# Patient Record
Sex: Female | Born: 1935
Health system: Southern US, Community
[De-identification: ages and names within clinical notes are randomized; demographics above are authoritative.]

## PROBLEM LIST (undated history)

## (undated) DIAGNOSIS — I10 Essential (primary) hypertension: Secondary | ICD-10-CM

## (undated) DIAGNOSIS — N3010 Interstitial cystitis (chronic) without hematuria: Secondary | ICD-10-CM

## (undated) DIAGNOSIS — K219 Gastro-esophageal reflux disease without esophagitis: Secondary | ICD-10-CM

## (undated) DIAGNOSIS — F32A Depression, unspecified: Secondary | ICD-10-CM

## (undated) DIAGNOSIS — K5790 Diverticulosis of intestine, part unspecified, without perforation or abscess without bleeding: Secondary | ICD-10-CM

## (undated) DIAGNOSIS — T7840XA Allergy, unspecified, initial encounter: Secondary | ICD-10-CM

## (undated) DIAGNOSIS — M549 Dorsalgia, unspecified: Secondary | ICD-10-CM

## (undated) DIAGNOSIS — E70319 Ocular albinism, unspecified: Secondary | ICD-10-CM

## (undated) DIAGNOSIS — C569 Malignant neoplasm of unspecified ovary: Secondary | ICD-10-CM

## (undated) DIAGNOSIS — Z5189 Encounter for other specified aftercare: Secondary | ICD-10-CM

## (undated) DIAGNOSIS — M1990 Unspecified osteoarthritis, unspecified site: Secondary | ICD-10-CM

## (undated) DIAGNOSIS — N390 Urinary tract infection, site not specified: Secondary | ICD-10-CM

## (undated) DIAGNOSIS — R42 Dizziness and giddiness: Secondary | ICD-10-CM

## (undated) DIAGNOSIS — F419 Anxiety disorder, unspecified: Secondary | ICD-10-CM

## (undated) DIAGNOSIS — K449 Diaphragmatic hernia without obstruction or gangrene: Secondary | ICD-10-CM

## (undated) DIAGNOSIS — K222 Esophageal obstruction: Secondary | ICD-10-CM

## (undated) DIAGNOSIS — M21619 Bunion of unspecified foot: Secondary | ICD-10-CM

## (undated) DIAGNOSIS — F329 Major depressive disorder, single episode, unspecified: Secondary | ICD-10-CM

## (undated) DIAGNOSIS — D649 Anemia, unspecified: Secondary | ICD-10-CM

## (undated) DIAGNOSIS — Z86010 Personal history of colonic polyps: Secondary | ICD-10-CM

## (undated) DIAGNOSIS — K589 Irritable bowel syndrome without diarrhea: Secondary | ICD-10-CM

## (undated) DIAGNOSIS — Z9221 Personal history of antineoplastic chemotherapy: Secondary | ICD-10-CM

## (undated) DIAGNOSIS — R918 Other nonspecific abnormal finding of lung field: Secondary | ICD-10-CM

## (undated) HISTORY — DX: Esophageal obstruction: K22.2

## (undated) HISTORY — DX: Irritable bowel syndrome, unspecified: K58.9

## (undated) HISTORY — PX: HEMORRHOID SURGERY: SHX153

## (undated) HISTORY — PX: BREAST CYST EXCISION: SHX579

## (undated) HISTORY — DX: Allergy, unspecified, initial encounter: T78.40XA

## (undated) HISTORY — PX: COLONOSCOPY: SHX174

## (undated) HISTORY — DX: Dizziness and giddiness: R42

## (undated) HISTORY — DX: Ocular albinism, unspecified: E70.319

## (undated) HISTORY — DX: Anxiety disorder, unspecified: F41.9

## (undated) HISTORY — DX: Anemia, unspecified: D64.9

## (undated) HISTORY — DX: Essential (primary) hypertension: I10

## (undated) HISTORY — DX: Interstitial cystitis (chronic) without hematuria: N30.10

## (undated) HISTORY — PX: OTHER SURGICAL HISTORY: SHX169

## (undated) HISTORY — DX: Gastro-esophageal reflux disease without esophagitis: K21.9

## (undated) HISTORY — DX: Unspecified osteoarthritis, unspecified site: M19.90

## (undated) HISTORY — DX: Urinary tract infection, site not specified: N39.0

## (undated) HISTORY — DX: Encounter for other specified aftercare: Z51.89

## (undated) HISTORY — DX: Malignant neoplasm of unspecified ovary: C56.9

## (undated) HISTORY — DX: Depression, unspecified: F32.A

## (undated) HISTORY — DX: Dorsalgia, unspecified: M54.9

## (undated) HISTORY — PX: INCISIONAL HERNIA REPAIR: SHX193

## (undated) HISTORY — DX: Major depressive disorder, single episode, unspecified: F32.9

## (undated) HISTORY — PX: APPENDECTOMY: SHX54

## (undated) HISTORY — DX: Other nonspecific abnormal finding of lung field: R91.8

## (undated) HISTORY — DX: Diaphragmatic hernia without obstruction or gangrene: K44.9

## (undated) HISTORY — DX: Bunion of unspecified foot: M21.619

## (undated) HISTORY — DX: Personal history of colonic polyps: Z86.010

## (undated) HISTORY — DX: Diverticulosis of intestine, part unspecified, without perforation or abscess without bleeding: K57.90

## (undated) SURGERY — VIDEO BRONCHOSCOPY WITH FLUORO
Anesthesia: Moderate Sedation

---

## 1960-04-01 HISTORY — PX: ABDOMINAL HYSTERECTOMY: SHX81

## 1984-04-21 ENCOUNTER — Encounter: Payer: Self-pay | Admitting: Internal Medicine

## 1992-04-01 HISTORY — PX: CHOLECYSTECTOMY: SHX55

## 1993-02-20 ENCOUNTER — Encounter: Payer: Self-pay | Admitting: Family Medicine

## 1993-02-21 ENCOUNTER — Encounter (INDEPENDENT_AMBULATORY_CARE_PROVIDER_SITE_OTHER): Payer: Self-pay | Admitting: Gastroenterology

## 1993-03-09 ENCOUNTER — Encounter: Payer: Self-pay | Admitting: Internal Medicine

## 1993-04-01 HISTORY — PX: INCISIONAL HERNIA REPAIR: SHX193

## 1999-07-24 ENCOUNTER — Encounter: Payer: Self-pay | Admitting: Family Medicine

## 1999-07-24 ENCOUNTER — Encounter: Admission: RE | Admit: 1999-07-24 | Discharge: 1999-07-24 | Payer: Self-pay | Admitting: Family Medicine

## 2004-04-04 ENCOUNTER — Ambulatory Visit: Payer: Self-pay | Admitting: Family Medicine

## 2004-05-01 ENCOUNTER — Ambulatory Visit: Payer: Self-pay | Admitting: Family Medicine

## 2004-05-01 LAB — HM MAMMOGRAPHY: HM Mammogram: NORMAL

## 2004-05-04 ENCOUNTER — Ambulatory Visit: Payer: Self-pay | Admitting: Family Medicine

## 2004-05-18 ENCOUNTER — Other Ambulatory Visit: Admission: RE | Admit: 2004-05-18 | Discharge: 2004-05-18 | Payer: Self-pay | Admitting: Family Medicine

## 2004-05-18 ENCOUNTER — Ambulatory Visit: Payer: Self-pay | Admitting: Family Medicine

## 2004-06-19 ENCOUNTER — Ambulatory Visit: Payer: Self-pay | Admitting: Family Medicine

## 2004-06-19 LAB — FECAL OCCULT BLOOD, GUAIAC: Fecal Occult Blood: NEGATIVE

## 2004-06-20 ENCOUNTER — Ambulatory Visit (HOSPITAL_BASED_OUTPATIENT_CLINIC_OR_DEPARTMENT_OTHER): Admission: RE | Admit: 2004-06-20 | Discharge: 2004-06-20 | Payer: Self-pay | Admitting: Surgery

## 2004-06-20 ENCOUNTER — Ambulatory Visit (HOSPITAL_COMMUNITY): Admission: RE | Admit: 2004-06-20 | Discharge: 2004-06-20 | Payer: Self-pay | Admitting: Surgery

## 2004-06-20 ENCOUNTER — Encounter (INDEPENDENT_AMBULATORY_CARE_PROVIDER_SITE_OTHER): Payer: Self-pay | Admitting: *Deleted

## 2004-06-27 ENCOUNTER — Ambulatory Visit: Payer: Self-pay | Admitting: Family Medicine

## 2005-06-30 HISTORY — PX: OTHER SURGICAL HISTORY: SHX169

## 2007-04-02 DIAGNOSIS — K222 Esophageal obstruction: Secondary | ICD-10-CM

## 2007-04-02 HISTORY — DX: Esophageal obstruction: K22.2

## 2007-07-31 ENCOUNTER — Encounter: Payer: Self-pay | Admitting: Family Medicine

## 2007-07-31 DIAGNOSIS — G8929 Other chronic pain: Secondary | ICD-10-CM | POA: Insufficient documentation

## 2007-07-31 DIAGNOSIS — F325 Major depressive disorder, single episode, in full remission: Secondary | ICD-10-CM | POA: Insufficient documentation

## 2007-07-31 DIAGNOSIS — F329 Major depressive disorder, single episode, unspecified: Secondary | ICD-10-CM

## 2007-07-31 DIAGNOSIS — I10 Essential (primary) hypertension: Secondary | ICD-10-CM | POA: Insufficient documentation

## 2007-07-31 DIAGNOSIS — F411 Generalized anxiety disorder: Secondary | ICD-10-CM

## 2007-07-31 DIAGNOSIS — Z86010 Personal history of colonic polyps: Secondary | ICD-10-CM

## 2007-07-31 DIAGNOSIS — M549 Dorsalgia, unspecified: Secondary | ICD-10-CM

## 2007-07-31 DIAGNOSIS — F419 Anxiety disorder, unspecified: Secondary | ICD-10-CM

## 2007-08-03 ENCOUNTER — Ambulatory Visit: Payer: Self-pay | Admitting: Family Medicine

## 2007-08-18 ENCOUNTER — Ambulatory Visit: Payer: Self-pay | Admitting: Family Medicine

## 2007-08-21 LAB — CONVERTED CEMR LAB
ALT: 16 units/L (ref 0–35)
AST: 22 units/L (ref 0–37)
Alkaline Phosphatase: 59 units/L (ref 39–117)
Basophils Absolute: 0.1 10*3/uL (ref 0.0–0.1)
Basophils Relative: 1.2 % — ABNORMAL HIGH (ref 0.0–1.0)
CO2: 29 meq/L (ref 19–32)
Calcium: 9.6 mg/dL (ref 8.4–10.5)
Cholesterol: 191 mg/dL (ref 0–200)
Creatinine, Ser: 0.9 mg/dL (ref 0.4–1.2)
Glucose, Bld: 96 mg/dL (ref 70–99)
LDL Cholesterol: 114 mg/dL — ABNORMAL HIGH (ref 0–99)
Lymphocytes Relative: 22.3 % (ref 12.0–46.0)
MCHC: 33 g/dL (ref 30.0–36.0)
Monocytes Relative: 8.3 % (ref 3.0–12.0)
Neutrophils Relative %: 62.7 % (ref 43.0–77.0)
RBC: 3.54 M/uL — ABNORMAL LOW (ref 3.87–5.11)
RDW: 12.4 % (ref 11.5–14.6)
Total Bilirubin: 0.8 mg/dL (ref 0.3–1.2)
Total CHOL/HDL Ratio: 3
VLDL: 14 mg/dL (ref 0–40)

## 2007-09-07 ENCOUNTER — Ambulatory Visit: Payer: Self-pay | Admitting: Family Medicine

## 2007-09-28 ENCOUNTER — Telehealth: Payer: Self-pay | Admitting: Internal Medicine

## 2007-09-30 HISTORY — PX: OTHER SURGICAL HISTORY: SHX169

## 2007-10-01 DIAGNOSIS — K589 Irritable bowel syndrome without diarrhea: Secondary | ICD-10-CM | POA: Insufficient documentation

## 2007-10-05 ENCOUNTER — Ambulatory Visit: Payer: Self-pay | Admitting: Internal Medicine

## 2007-10-13 ENCOUNTER — Ambulatory Visit: Payer: Self-pay | Admitting: Internal Medicine

## 2007-10-13 DIAGNOSIS — K222 Esophageal obstruction: Secondary | ICD-10-CM

## 2007-10-15 ENCOUNTER — Ambulatory Visit (HOSPITAL_COMMUNITY): Admission: RE | Admit: 2007-10-15 | Discharge: 2007-10-15 | Payer: Self-pay | Admitting: Internal Medicine

## 2007-10-19 ENCOUNTER — Telehealth: Payer: Self-pay | Admitting: Family Medicine

## 2007-10-19 ENCOUNTER — Telehealth (INDEPENDENT_AMBULATORY_CARE_PROVIDER_SITE_OTHER): Payer: Self-pay | Admitting: *Deleted

## 2007-10-19 DIAGNOSIS — M21619 Bunion of unspecified foot: Secondary | ICD-10-CM

## 2007-10-19 HISTORY — DX: Bunion of unspecified foot: M21.619

## 2007-10-20 ENCOUNTER — Encounter: Payer: Self-pay | Admitting: Internal Medicine

## 2007-10-23 ENCOUNTER — Ambulatory Visit (HOSPITAL_COMMUNITY): Admission: RE | Admit: 2007-10-23 | Discharge: 2007-10-23 | Payer: Self-pay | Admitting: Internal Medicine

## 2007-10-23 ENCOUNTER — Encounter: Payer: Self-pay | Admitting: Internal Medicine

## 2007-10-27 ENCOUNTER — Ambulatory Visit: Payer: Self-pay | Admitting: Internal Medicine

## 2007-11-18 ENCOUNTER — Ambulatory Visit: Payer: Self-pay | Admitting: Family Medicine

## 2007-12-10 ENCOUNTER — Encounter: Payer: Self-pay | Admitting: Family Medicine

## 2007-12-28 ENCOUNTER — Telehealth: Payer: Self-pay | Admitting: Family Medicine

## 2007-12-30 ENCOUNTER — Ambulatory Visit: Payer: Self-pay | Admitting: Family Medicine

## 2007-12-31 HISTORY — PX: FOOT SURGERY: SHX648

## 2008-01-06 ENCOUNTER — Encounter: Payer: Self-pay | Admitting: Family Medicine

## 2008-01-13 ENCOUNTER — Encounter: Payer: Self-pay | Admitting: Family Medicine

## 2008-04-01 DIAGNOSIS — Z860100 Personal history of colon polyps, unspecified: Secondary | ICD-10-CM

## 2008-04-01 DIAGNOSIS — K5790 Diverticulosis of intestine, part unspecified, without perforation or abscess without bleeding: Secondary | ICD-10-CM

## 2008-04-01 HISTORY — DX: Personal history of colonic polyps: Z86.010

## 2008-04-01 HISTORY — DX: Personal history of colon polyps, unspecified: Z86.0100

## 2008-04-01 HISTORY — DX: Diverticulosis of intestine, part unspecified, without perforation or abscess without bleeding: K57.90

## 2008-09-09 ENCOUNTER — Ambulatory Visit: Payer: Self-pay | Admitting: Cardiovascular Disease

## 2008-09-09 ENCOUNTER — Ambulatory Visit: Payer: Self-pay | Admitting: Family Medicine

## 2008-09-12 ENCOUNTER — Telehealth: Payer: Self-pay | Admitting: Family Medicine

## 2008-09-16 ENCOUNTER — Ambulatory Visit: Payer: Self-pay | Admitting: Family Medicine

## 2008-09-19 LAB — CONVERTED CEMR LAB
Bilirubin, Direct: 0 mg/dL (ref 0.0–0.3)
Creatinine, Ser: 0.9 mg/dL (ref 0.4–1.2)
Folate: >20 ng/mL
Glucose, Bld: 83 mg/dL (ref 70–99)
HDL: 62.4 mg/dL (ref >39.00)
LDL Cholesterol: 89 mg/dL (ref 0–99)
Phosphorus: 3.9 mg/dL (ref 2.3–4.6)
Sodium: 141 meq/L (ref 135–145)
Total Bilirubin: 1.1 mg/dL (ref 0.3–1.2)
Total CHOL/HDL Ratio: 3
Total Protein: 6.9 g/dL (ref 6.0–8.3)
VLDL: 17.2 mg/dL (ref 0.0–40.0)

## 2008-09-20 ENCOUNTER — Ambulatory Visit: Payer: Self-pay | Admitting: Family Medicine

## 2008-09-22 LAB — CONVERTED CEMR LAB: Vit D, 25-Hydroxy: 20 ng/mL — ABNORMAL LOW (ref 30–89)

## 2008-09-27 ENCOUNTER — Ambulatory Visit: Payer: Self-pay | Admitting: Family Medicine

## 2008-10-04 ENCOUNTER — Ambulatory Visit: Payer: Self-pay | Admitting: Family Medicine

## 2008-10-04 DIAGNOSIS — E538 Deficiency of other specified B group vitamins: Secondary | ICD-10-CM

## 2008-10-11 ENCOUNTER — Encounter: Payer: Self-pay | Admitting: Cardiovascular Disease

## 2008-10-11 ENCOUNTER — Ambulatory Visit: Payer: Self-pay | Admitting: Family Medicine

## 2008-10-11 DIAGNOSIS — E559 Vitamin D deficiency, unspecified: Secondary | ICD-10-CM

## 2008-10-14 ENCOUNTER — Encounter (INDEPENDENT_AMBULATORY_CARE_PROVIDER_SITE_OTHER): Payer: Self-pay | Admitting: *Deleted

## 2008-11-07 ENCOUNTER — Telehealth: Payer: Self-pay | Admitting: Family Medicine

## 2008-11-08 ENCOUNTER — Ambulatory Visit: Payer: Self-pay | Admitting: Family Medicine

## 2008-11-09 ENCOUNTER — Ambulatory Visit: Payer: Self-pay | Admitting: Family Medicine

## 2008-11-10 DIAGNOSIS — Z8719 Personal history of other diseases of the digestive system: Secondary | ICD-10-CM | POA: Insufficient documentation

## 2008-11-11 LAB — CONVERTED CEMR LAB
Basophils Absolute: 0 10*3/uL (ref 0.0–0.1)
Basophils Relative: 0.4 % (ref 0.0–3.0)
Eosinophils Absolute: 0.1 10*3/uL (ref 0.0–0.7)
Lymphocytes Relative: 22.7 % (ref 12.0–46.0)
MCHC: 34.3 g/dL (ref 30.0–36.0)
Neutrophils Relative %: 64.4 % (ref 43.0–77.0)
RBC: 3.48 M/uL — ABNORMAL LOW (ref 3.87–5.11)
WBC: 5.7 10*3/uL (ref 4.5–10.5)

## 2008-11-16 ENCOUNTER — Ambulatory Visit: Payer: Self-pay | Admitting: Cardiovascular Disease

## 2008-11-18 ENCOUNTER — Telehealth: Payer: Self-pay | Admitting: Cardiovascular Disease

## 2008-11-22 ENCOUNTER — Encounter: Payer: Self-pay | Admitting: Cardiovascular Disease

## 2008-11-30 ENCOUNTER — Encounter: Payer: Self-pay | Admitting: Family Medicine

## 2008-11-30 HISTORY — PX: OTHER SURGICAL HISTORY: SHX169

## 2008-12-01 ENCOUNTER — Telehealth (INDEPENDENT_AMBULATORY_CARE_PROVIDER_SITE_OTHER): Payer: Self-pay | Admitting: *Deleted

## 2008-12-06 ENCOUNTER — Ambulatory Visit: Payer: Self-pay

## 2008-12-06 ENCOUNTER — Encounter: Payer: Self-pay | Admitting: Cardiology

## 2008-12-06 ENCOUNTER — Encounter: Payer: Self-pay | Admitting: Cardiovascular Disease

## 2008-12-12 ENCOUNTER — Ambulatory Visit: Payer: Self-pay | Admitting: Family Medicine

## 2008-12-14 LAB — CONVERTED CEMR LAB: Vit D, 25-Hydroxy: 49 ng/mL (ref 30–89)

## 2009-01-17 ENCOUNTER — Ambulatory Visit: Payer: Self-pay | Admitting: Family Medicine

## 2009-01-24 ENCOUNTER — Ambulatory Visit: Payer: Self-pay | Admitting: Family Medicine

## 2009-01-24 LAB — CONVERTED CEMR LAB: OCCULT 1: NEGATIVE

## 2009-01-30 ENCOUNTER — Ambulatory Visit: Payer: Self-pay | Admitting: Internal Medicine

## 2009-01-31 ENCOUNTER — Encounter: Payer: Self-pay | Admitting: Internal Medicine

## 2009-01-31 ENCOUNTER — Telehealth: Payer: Self-pay | Admitting: Internal Medicine

## 2009-02-21 ENCOUNTER — Telehealth: Payer: Self-pay | Admitting: Family Medicine

## 2009-02-21 ENCOUNTER — Ambulatory Visit: Payer: Self-pay | Admitting: Family Medicine

## 2009-02-28 ENCOUNTER — Ambulatory Visit (HOSPITAL_COMMUNITY): Admission: RE | Admit: 2009-02-28 | Discharge: 2009-02-28 | Payer: Self-pay | Admitting: Internal Medicine

## 2009-02-28 ENCOUNTER — Telehealth: Payer: Self-pay | Admitting: Internal Medicine

## 2009-03-03 ENCOUNTER — Telehealth: Payer: Self-pay | Admitting: Family Medicine

## 2009-03-06 ENCOUNTER — Telehealth (INDEPENDENT_AMBULATORY_CARE_PROVIDER_SITE_OTHER): Payer: Self-pay | Admitting: *Deleted

## 2009-03-07 ENCOUNTER — Ambulatory Visit: Payer: Self-pay | Admitting: Internal Medicine

## 2009-03-09 ENCOUNTER — Encounter: Payer: Self-pay | Admitting: Internal Medicine

## 2009-03-10 ENCOUNTER — Ambulatory Visit: Payer: Self-pay | Admitting: Family Medicine

## 2009-03-10 LAB — CONVERTED CEMR LAB: Creatinine, Ser: 0.7 mg/dL (ref 0.4–1.2)

## 2009-04-04 ENCOUNTER — Ambulatory Visit: Payer: Self-pay | Admitting: Family Medicine

## 2009-05-08 ENCOUNTER — Encounter: Admission: RE | Admit: 2009-05-08 | Discharge: 2009-05-08 | Payer: Self-pay | Admitting: Family Medicine

## 2009-05-09 ENCOUNTER — Ambulatory Visit: Payer: Self-pay | Admitting: Family Medicine

## 2009-05-09 ENCOUNTER — Telehealth: Payer: Self-pay | Admitting: Family Medicine

## 2009-05-29 ENCOUNTER — Ambulatory Visit: Payer: Self-pay | Admitting: Internal Medicine

## 2009-05-29 DIAGNOSIS — K5730 Diverticulosis of large intestine without perforation or abscess without bleeding: Secondary | ICD-10-CM

## 2009-06-06 ENCOUNTER — Ambulatory Visit: Payer: Self-pay | Admitting: Family Medicine

## 2009-06-07 ENCOUNTER — Encounter: Payer: Self-pay | Admitting: Family Medicine

## 2009-06-07 ENCOUNTER — Encounter (INDEPENDENT_AMBULATORY_CARE_PROVIDER_SITE_OTHER): Payer: Self-pay | Admitting: *Deleted

## 2009-06-12 ENCOUNTER — Telehealth (INDEPENDENT_AMBULATORY_CARE_PROVIDER_SITE_OTHER): Payer: Self-pay | Admitting: *Deleted

## 2009-07-28 ENCOUNTER — Telehealth: Payer: Self-pay | Admitting: Family Medicine

## 2009-11-09 ENCOUNTER — Telehealth: Payer: Self-pay | Admitting: Family Medicine

## 2009-11-27 ENCOUNTER — Ambulatory Visit: Payer: Self-pay | Admitting: Family Medicine

## 2009-12-15 ENCOUNTER — Encounter: Payer: Self-pay | Admitting: Family Medicine

## 2009-12-18 ENCOUNTER — Telehealth: Payer: Self-pay | Admitting: Family Medicine

## 2009-12-26 ENCOUNTER — Ambulatory Visit: Payer: Self-pay | Admitting: Internal Medicine

## 2009-12-30 ENCOUNTER — Telehealth (INDEPENDENT_AMBULATORY_CARE_PROVIDER_SITE_OTHER): Payer: Self-pay | Admitting: *Deleted

## 2010-01-16 ENCOUNTER — Ambulatory Visit: Payer: Self-pay

## 2010-01-16 ENCOUNTER — Encounter: Payer: Self-pay | Admitting: Internal Medicine

## 2010-03-13 ENCOUNTER — Encounter: Payer: Self-pay | Admitting: Family Medicine

## 2010-04-17 ENCOUNTER — Ambulatory Visit
Admission: RE | Admit: 2010-04-17 | Discharge: 2010-04-17 | Payer: Self-pay | Source: Home / Self Care | Attending: Family Medicine | Admitting: Family Medicine

## 2010-04-17 ENCOUNTER — Encounter: Payer: Self-pay | Admitting: Family Medicine

## 2010-04-19 ENCOUNTER — Telehealth: Payer: Self-pay | Admitting: Family Medicine

## 2010-04-22 ENCOUNTER — Encounter: Payer: Self-pay | Admitting: Internal Medicine

## 2010-04-22 ENCOUNTER — Encounter: Payer: Self-pay | Admitting: Family Medicine

## 2010-04-29 LAB — CONVERTED CEMR LAB: Vitamin B-12: >1500 pg/mL — ABNORMAL HIGH (ref 211–911)

## 2010-05-03 NOTE — Progress Notes (Signed)
Summary: 48 hour holter  Phone Note Outgoing Call Call back at [facility] Phone [phone]   Call placed by: Stanton Kidney, EMT-P,  December 30, 2009 1:03 PM Summary of Call: Left message for Pt. to call to schedule for 48 hour holter. Returning call from yesterday to schedule appt. for Holter monitor/left message. Stanton Kidney, EMT-P  January 09, 2010 8:48 AM  Pt scheduled 01/16/10.

## 2010-05-03 NOTE — Progress Notes (Signed)
Summary: diarrhea, vomitting  Phone Note Call from Patient Call back at Home Phone [phone] Call back at Work Phone [phone]   Caller: Patient Call For: Judith Part MD Summary of Call: Patient wants phone call from dr. Milinda Antis. Patient says that she has had diarrhea and vomitting all day and night wed and thursday this week. , Was hurting and cramping so bad. Stool was black during this time. Still having cramping in lower abdomen. She is so bloaded that she feels pregnant. She wants phone call to discuss meds and the problems with her stomach. Please advise.  Initial call taken by: Melody Comas,  July 28, 2009 3:22 PM  Follow-up for Phone Call        I called her personally at home -- left mess then I called her work number -- this was disconnected (? perhaps wrong number) I am worried that dark black stool could mean GI bleed-- I think she should seek care at [facility] over weekend to get checked out then update me monday unfortunately I have to leave office for weekend now  Follow-up by: Judith Part MD,  July 28, 2009 4:16 PM  Additional Follow-up for Phone Call Additional follow up Details #1::        Patient notified as instructed by telephone. Pt said she has not had black stool since yesterday. Pt wanted Dr Milinda Antis to know how she had been doing. Pt still having cramping and low abdominal pain and her stomach feels swollen. Advised pt to go to UC this weekend if needed and to call on Mon and update Dr. Milinda Antis how she is doing. Lewanda Rife LPN  July 28, 2009 4:28 PM

## 2010-05-03 NOTE — Progress Notes (Signed)
Summary: Request copy of MRI/MRA  Phone Note Call from Patient Call back at Home Phone [phone]   Caller: Patient Call For: Judith Part MD Summary of Call: Patient saw Dr. Reynolds/neurologist last week and he suggested that she have a MRI and MRA. Patient states that she recently had one done that you ordered. Patient would like this report faxed to Dr. Thad Ranger at [phone] to the attention of Renee Initial call taken by: Sydell Axon LPN,  December 18, 2009 11:19 AM  Follow-up for Phone Call        please send those reports - thanks they were from feb of this year Follow-up by: Judith Part MD,  December 18, 2009 11:39 AM  Additional Follow-up for Phone Call Additional follow up Details #1::        MRI and MRA reports from Feb 2011 faxed to Dr Thad Ranger [facility] [phone] as instructed. Left message for patient to call back. Lewanda Rife LPN  December 18, 2009 1:00 PM   Patient notified as instructed by telephone. Lewanda Rife LPN  December 18, 2009 2:52 PM

## 2010-05-03 NOTE — Assessment & Plan Note (Signed)
Summary: b-12 shot  Nurse Visit   Allergies: 1)  ! * Hrt Patches  Medication Administration  Injection # 1:    Medication: Vit B12 1000 mcg    Diagnosis: VITAMIN B12 DEFICIENCY (ICD-266.2)    Route: IM    Site: R deltoid    Exp Date: 12/31/2010    Lot #: [id number]    Mfr: American Regent    Patient tolerated injection without complications    Given by: Lewanda Rife LPN (May 09, 2009 11:57 AM)  Orders Added: 1)  Admin of Therapeutic Inj  intramuscular or subcutaneous [96372] 2)  Vit B12 1000 mcg [J3420]

## 2010-05-03 NOTE — Progress Notes (Signed)
Summary: status of medical clearance paperwork  Phone Note Call from Patient Call back at Home Phone [phone]   Caller: Patient Call For: Judith Part MD Summary of Call: Pt is asking for the status of her medical clearance paperwork.  She said she has talked with Dr. Ranell Patrick' office and they have not received anything. Initial call taken by: Lowella Petties CMA, AAMA,  April 19, 2010 11:50 AM  Follow-up for Phone Call        I put it all together to fax or send -- was put in Rena's IN box Follow-up by: Judith Part MD,  April 19, 2010 12:53 PM  Additional Follow-up for Phone Call Additional follow up Details #1::        I faxed it yesterday and got a confirmation fax back. I will refax and call them as well. Additional Follow-up by: Janee Morn CMA Duncan Dull),  April 19, 2010 2:00 PM    Additional Follow-up for Phone Call Additional follow up Details #2::    I spoke with [facility] and had faxed it to med records instead of the surgical nurse which is why Mrs. Basham had been told they didn't receive it. I refaxed it to the surgical nurse and let Mrs. Manwarren know what the issue was.  Follow-up by: Janee Morn CMA Duncan Dull),  April 19, 2010 2:08 PM

## 2010-05-03 NOTE — Assessment & Plan Note (Signed)
Summary: F.U AFTER PROCEDURES   History of Present Illness Visit Type: Follow-up Visit Primary GI MD: Yancey Flemings MD Primary Provider: Judith Part MD Requesting Provider: [facility] Chief Complaint: Constant burning, medicine not effective, chest pain radiates to back History of Present Illness:   75 year old female with hypertension, osteoarthritis, anxiety/depression, and GERD. She presents today for followup. She was evaluated in early November for symptoms compatible with diarrhea predominant irritable bowel syndrome, pharyngeal burning sensation that she attributed to reflux, and chronic intermittent epigastric pain. See that dictation for details. She was continued on b.i.d. PPI therapy and advised with regards to optimal dosing times. She continues with B12 replacement. Abdominal ultrasound was obtained November 30 and returned normal post cholecystectomy. Finally, she underwent upper endoscopy and colonoscopy on December 7. Upper endoscopy revealed a benign asymptomatic distal esophageal stricture, atrophic gastric mucosa with incidental pedunculated polyp in the fundus, sliding hiatal hernia, and a duodenal diverticulum. Colonoscopy revealed a diminutive sigmoid colon polyp which was removed and found to be adenomatous. Also sigmoid diverticulosis. Repeat colonoscopy in 5 years recommended. Random biopsies of the colon were normal with no evidence of microscopic colitis. She presents today for followup. She is accompanied by her friend who participates in the encounter. The patient states that she continues with irritable bowel type symptoms as well as intermittent epigastric pain. As well she complains of fairly constant pharyngeal burning that has not improved with b.i.d. proton pump inhibitor. No substernal or esophageal burning.Marland Kitchen   GI Review of Systems    Reports abdominal pain and  acid reflux.     Location of  Abdominal pain: epigastric area.    Denies belching, bloating, chest  pain, dysphagia with liquids, dysphagia with solids, heartburn, loss of appetite, nausea, vomiting, vomiting blood, weight loss, and  weight gain.      Reports diarrhea and  irritable bowel syndrome.     Denies anal fissure, black tarry stools, change in bowel habit, constipation, diverticulosis, fecal incontinence, heme positive stool, hemorrhoids, jaundice, light color stool, liver problems, rectal bleeding, and  rectal pain.    Current Medications (verified): 1)  Wellbutrin Sr 150 Mg  Tb12 (Bupropion Hcl) .... Take 2 By Mouth Daily 2)  Amitriptyline Hcl 100 Mg  Tabs (Amitriptyline Hcl) .... Take One By Mouth Q Hs 3)  Alprazolam 0.25 Mg  Tabs (Alprazolam) .... Take One By Mouth Q 4 Hrs Prn 4)  Zebeta 5 Mg  Tabs (Bisoprolol Fumarate) .... 1/2 By Mouth Once Daily 5)  Dyazide 37.5-25 Mg  Caps (Triamterene-Hctz) .Marland Kitchen.. 1 By Mouth Once Daily 6)  Vitamin D (Ergocalciferol) 50000 Unit Caps (Ergocalciferol) .... One Cap By Mouth Weekly 7)  Prilosec 20 Mg Cpdr (Omeprazole) .Marland Kitchen.. 1 By Mouth Two Times A Day 8)  Cyanocobalamin 1000 Mcg/ml Soln (Cyanocobalamin) .... One Injection Monthly 9)  Cyanocobalamin 1000 Mcg/ml Soln (Cyanocobalamin) .... Monthly Injections  Allergies (verified): 1)  ! * Hrt Patches  Past History:  Past Medical History: Reviewed history from 12/12/2008 and no changes required. Anxiety Depression GERD Hypertension chronic back pain  OA  dizziness/vertigo   GI cardiol- [facility] neurol- [facility]  Past Surgical History: Reviewed history from 12/12/2008 and no changes required. Hysterectomy- age 55, ovarian cancer (1962) Cholecystectomy (1994) Right shoulder surgery Left knee surgery Incisional hernia repair after ccy Carpal tunnel release- right Breast biopsy- benign Bunion/  morton's neuroma (06/2005) right foot colonoscopy- very difficult- supp with [facility] at 25 for colitis - transfusion hemorrhoidectomy 7.09 EGD tight ues sphincter with  dilatation 10/09  foot surgs TMT fusion and bunionectomy 6/10 CT head- small vessel chronic change/ no acute findings stress cardiolite 9/10  ortho -- Dr Lestine Box  Family History: Reviewed history from 11/16/2008 and no changes required. Father: deceased- ? CAD, HTN Mother: CVA Siblings: 2 brothers, 1 sister- sister has HTN,hyperlipidemia  Social History: Reviewed history from 11/16/2008 and no changes required. Widowed. 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  cares for mother in nursing home  Social wine No illicit drug use  Review of Systems       The patient complains of allergy/sinus, anemia, anxiety-new, arthritis/joint pain, back pain, depression-new, fatigue, headaches-new, and sleeping problems.  The patient denies blood in urine, breast changes/lumps, change in vision, confusion, cough, coughing up blood, fainting, fever, hearing problems, heart murmur, heart rhythm changes, itching, menstrual pain, muscle pains/cramps, night sweats, nosebleeds, pregnancy symptoms, shortness of breath, skin rash, sore throat, swelling of feet/legs, thirst - excessive , urination - excessive , urination changes/pain, urine leakage, vision changes, and voice change.    Vital Signs:  Patient profile:   75 year old female Height:      63 inches Weight:      158.25 pounds BMI:     28.13 Pulse rate:   80 / minute Pulse rhythm:   regular BP sitting:   152 / 86  (left arm) Cuff size:   regular  Vitals Entered By: June McMurray CMA Duncan Dull) (May 29, 2009 11:07 AM)  Physical Exam  General:  Well developed, well nourished, no acute distress. Head:  Normocephalic and atraumatic. Eyes:  PERRLA, no icterus. Nose:  No deformity, discharge,  or lesions. Mouth:  No deformity or lesions, dentition normal.normal posterior pharynx. No evidence of erythema or thrush. Neck:  Supple; no masses or thyromegaly. Lungs:  Clear throughout to auscultation. Heart:  Regular  rate and rhythm; no murmurs, rubs,  or bruits. Abdomen:  Soft, nontender and nondistended. No masses, hepatosplenomegaly or hernias noted. Normal bowel sounds. Msk:  Symmetrical with no gross deformities. Normal posture. Pulses:  Normal pulses noted. Extremities:  no edema Neurologic:  Alert and  oriented x4;  Skin:  Intact without significant lesions or rashes. Psych:  Alert and cooperative. Normal mood and affect.   Impression & Recommendations:  Problem # 1:  IBS (ICD-564.1) diarrhea predominant irritable bowel syndrome. Ongoing. Educational discussions today on the nature of your irritable bowel syndrome as well as current treatment strategies.  Plan: #1. Prescribed Librax one p.o. a.c. p.r.n. #2. GI followup p.r.n. #3. IBS literature provided for her review  Problem # 2:  EPIGASTRIC PAIN (ICD-789.06) intermittent sharp epigastric pain of uncertain etiology. Unchanged over 2 years. No explanation by ultrasound or endoscopy. Might be intestinal spasm. We will see if Librax is helpful for these episodes of pain.  Problem # 3:  GERD (ICD-530.81) the patient has GERD. Is quite unclear the  whether this explains the pharyngeal burning symptoms. One would expect higher dose proton pump inhibitor therapy to have been helpful. Also no evidence of true reflux symptoms such as pyrosis or waterbrash. As b.i.d. PPI and has made no difference in symptoms, she may refer back to once daily therapy assuming that true reflux symptoms do not appear.  Problem # 4:  COLONIC POLYPS, HX OF (ICD-V12.72) adenomatous colon polyp on recent colonoscopy. Followup in 5 years as recommended  Problem # 5:  DIVERTICULOSIS-COLON (ICD-562.10) the patient had a number of questions regarding diverticular disease and diverticulosis. We discuss  these issues as well. Also, I provide her with supplemental literature to review on the topic. Also recommend a high-fiber diet.  Patient Instructions: 1)  Librax 1 by mouth  before meals as needed #100 x 3 RFs. sent to your pharmacy for you to pick up.  2)  Diverticular Disease brochure given.  3)  IBS brochure given.  4)  The medication list was reviewed and reconciled.  All changed / newly prescribed medications were explained.  A complete medication list was provided to the patient / caregiver. Prescriptions: LIBRAX 2.5-5 MG CAPS (CLIDINIUM-CHLORDIAZEPOXIDE) 1 by mouth before meals as needed  #100 x 3   Entered by:   Milford Cage NCMA   Authorized by:   Hilarie Fredrickson MD   Signed by:   Milford Cage NCMA on 05/29/2009   Method used:   Electronically to        [facility] (retail)       [address]       [place], [place]  [address]       Ph: [phone]       Fax: [phone]   RxID:   [id number]

## 2010-05-03 NOTE — Miscellaneous (Signed)
  Clinical Lists Changes  Medications: Changed medication from VITAMIN D 1000 UNIT  TABS (CHOLECALCIFEROL) take one tablet daily to VITAMIN D 1000 UNIT  TABS (CHOLECALCIFEROL) take two  tablest daily

## 2010-05-03 NOTE — Progress Notes (Signed)
Summary: acid reflux  Phone Note Call from Patient Call back at Home Phone [phone]   Caller: Patient Call For: Judith Part MD Summary of Call: Patient says that she is having a really hard time with acid reflux. She says that it just comes on at various times. She is taking prilosec, chlordiaz and neither one are helping. She wants to know if there is anything else that can be doen. She uses [facility].  Initial call taken by: Melody Comas,  November 09, 2009 10:03 AM  Follow-up for Phone Call        lets try nexium instead of prilosec and then f/u with me in 2-4 wk px written on EMR for call in  Follow-up by: Judith Part MD,  November 09, 2009 12:49 PM  Additional Follow-up for Phone Call Additional follow up Details #1::        Patient notified as instructed by telephone. appointment with Dr Milinda Antis scheduled as instructed 11/27/09 at 11:30a.m. I was going to call in Nexium to [facility] and pharmacist said pt does not have insurance could we try generic Protonix instead. Please advise. Lewanda Rife LPN  November 09, 2009 1:13 PM     Additional Follow-up for Phone Call Additional follow up Details #2::    that is fine - will change to protonix Follow-up by: Judith Part MD,  November 09, 2009 1:16 PM  Additional Follow-up for Phone Call Additional follow up Details #3:: Details for Additional Follow-up Action Taken: Medication phoned to [facility] as instructed. Lewanda Rife LPN  November 09, 2009 3:20 PM   New/Updated Medications: NEXIUM 40 MG CPDR (ESOMEPRAZOLE MAGNESIUM) 1 by mouth once daily in am PROTONIX 40 MG TBEC (PANTOPRAZOLE SODIUM) 1 by mouth once daily in am Prescriptions: PROTONIX 40 MG TBEC (PANTOPRAZOLE SODIUM) 1 by mouth once daily in am  #30 x 5   Entered and Authorized by:   Judith Part MD   Signed by:   Lewanda Rife LPN on 09/81/1914   Method used:   Telephoned to ...       [facility] (retail)       [address]       [place], [place]  [address]       Ph: [phone]       Fax: [phone]   RxID:   [id number] NEXIUM 40 MG CPDR (ESOMEPRAZOLE MAGNESIUM) 1 by mouth once daily in am  #30 x 11   Entered and Authorized by:   Judith Part MD   Signed by:   Lewanda Rife LPN on 40/34/7425   Method used:   Telephoned to ...       [facility] (retail)       [address]       [place], [place]  [address]       Ph: [phone]       Fax: [phone]   RxID:   [id number]

## 2010-05-03 NOTE — Assessment & Plan Note (Signed)
Summary: FOLLOW UP AFTER STARTING NEXIUM/RI   Vital Signs:  Patient profile:   75 year old female Height:      63 inches Weight:      158 pounds BMI:     28.09 Temp:     98.2 degrees F oral Pulse rate:   84 / minute Pulse rhythm:   regular BP sitting:   142 / 80  (left arm) Cuff size:   regular  Vitals Entered By: Lewanda Rife LPN (November 27, 2009 11:47 AM) CC: f/u after starting Nexium and still dizzy   History of Present Illness: called here saying her reflux was worse last week  some vomiting of bile with bad burning in the chest like heartburn  from throat to abd  epigastric pain - goes through to the back  sometimes has to take her bra off  was using prilosec  then switch to protonix  has improved a bit -- but very little   is more stressed lately caring for her mother  daily life is overwhelming - at her age -- and harder to get done what she needs to  gets exhausted  mood is not very good due to all of this -- very frustrated   no energy feels generally dizzy  gets flushed in face with it and not getting better is not positional    had EGD in 7/09- with dilatation sees Dr Marina Goodell  needs her B12 shot   Allergies: 1)  ! * Hrt Patches  Past History:  Past Surgical History: Last updated: 12/12/2008 Hysterectomy- age 62, ovarian cancer (1962) Cholecystectomy (1994) Right shoulder surgery Left knee surgery Incisional hernia repair after [facility] Carpal tunnel release- right Breast biopsy- benign Bunion/  morton's neuroma (06/2005) right foot colonoscopy- very difficult- supp with [facility] at 25 for colitis - transfusion hemorrhoidectomy 7.09 EGD tight ues sphincter with dilatation 10/09 foot surgs TMT fusion and bunionectomy 6/10 CT head- small vessel chronic change/ no acute findings stress cardiolite 9/10  ortho -- Dr Lestine Box  Family History: Last updated: Nov 29, 2008 Father: deceased- ? CAD, HTN Mother: CVA Siblings: 2 brothers, 1 sister-  sister has HTN,hyperlipidemia  Social History: Last updated: 11/29/2008 Widowed. 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  cares for mother in nursing home  Social wine No illicit drug use  Risk Factors: Smoking Status: never (10/05/2007)  Past Medical History: Anxiety Depression GERD Hypertension chronic back pain  OA  dizziness/vertigo   GI- [facility]- [facility] neurol- [facility]  Review of Systems General:  Complains of fatigue; denies chills, fever, loss of appetite, and malaise. Eyes:  Denies blurring and eye pain. CV:  Denies lightheadness, palpitations, and shortness of breath with exertion. Resp:  Complains of cough; denies pleuritic, shortness of breath, sputum productive, and wheezing. GI:  Complains of abdominal pain, indigestion, nausea, and vomiting; denies bloody stools, change in bowel habits, constipation, dark tarry stools, and diarrhea. GU:  Denies dysuria and urinary frequency. Derm:  Denies itching, lesion(s), and rash. Neuro:  Complains of numbness and tingling. Psych:  Complains of anxiety; is very stressed . Endo:  Denies cold intolerance, excessive thirst, excessive urination, and heat intolerance. Heme:  Denies abnormal bruising and bleeding.  Physical Exam  General:  stressed and tired appearing today Head:  normocephalic, atraumatic, and no abnormalities observed.   Eyes:  vision grossly intact, pupils equal, pupils round, and pupils reactive to light.  no conjunctival pallor, injection or icterus  Mouth:  pharynx pink  and moist.   Neck:  supple with full rom and no masses or thyromegally, no JVD or carotid bruit  Chest Wall:  No deformities, masses, or tenderness noted. Lungs:  Normal respiratory effort, chest expands symmetrically. Lungs are clear to auscultation, no crackles or wheezes. Heart:  Normal rate and regular rhythm. S1 and S2 normal without gallop, murmur, click, rub or other extra  sounds. Abdomen:  tender epigastric area without rebound or gaurding no murphy sign soft, normal bowel sounds, no distention, no masses, no hepatomegaly, and no splenomegaly.   Extremities:  No clubbing, cyanosis, edema, or deformity noted with normal full range of motion of all joints.   Neurologic:  cranial nerves II-XII intact, strength normal in all extremities, sensation intact to light touch, gait normal, and DTRs symmetrical and normal.   Skin:  Intact without suspicious lesions or rashes Cervical Nodes:  No lymphadenopathy noted Psych:  normal affect, talkative and pleasant    Impression & Recommendations:  Problem # 1:  DIZZINESS (ICD-780.4) Assessment Unchanged continues to worsen  neg w/u so far incl MRI and MRA ref to neuro Orders: Neurology Referral (Neuro)  Problem # 2:  GERD (ICD-530.81) Assessment: Deteriorated much worse lately with inc stress also hx of stricture failed prilosec and protonix samples of nexium given  ref to GI The following medications were removed from the medication list:    Protonix 40 Mg Tbec (Pantoprazole sodium) .Marland Kitchen... 1 by mouth once daily in am Her updated medication list for this problem includes:    Librax 2.5-5 Mg Caps (Clidinium-chlordiazepoxide) .Marland Kitchen... 1 by mouth before meals as needed    Nexium 40 Mg Cpdr (Esomeprazole magnesium) .Marland Kitchen... 1 by mouth once daily in am  Orders: Gastroenterology Referral (GI)  Problem # 3:  VITAMIN B12 DEFICIENCY (ICD-266.2) Assessment: Comment Only  shot today- is due   Orders: Vit B12 1000 mcg (J3420) Admin of Therapeutic Inj  intramuscular or subcutaneous ([address])  Complete Medication List: 1)  Wellbutrin Sr 150 Mg Tb12 (Bupropion hcl) .... Take 2 by mouth daily 2)  Amitriptyline Hcl 100 Mg Tabs (Amitriptyline hcl) .... Take one by mouth at bedtime 3)  Alprazolam 0.25 Mg Tabs (Alprazolam) .... Take one by mouth q 4 hrs prn 4)  Zebeta 5 Mg Tabs (Bisoprolol fumarate) .... 1/2 by mouth once  daily 5)  Dyazide 37.5-25 Mg Caps (Triamterene-hctz) .Marland Kitchen.. 1 by mouth once daily 6)  Librax 2.5-5 Mg Caps (Clidinium-chlordiazepoxide) .Marland Kitchen.. 1 by mouth before meals as needed 7)  Vitamin D 1000 Unit Tabs (Cholecalciferol) .... Take two  tablest daily 8)  Cyanocobalamin 1000 Mcg/ml Soln (Cyanocobalamin) .... Take one injection every three months. 9)  Nexium 40 Mg Cpdr (Esomeprazole magnesium) .Marland Kitchen.. 1 by mouth once daily in am  Patient Instructions: 1)  stop the protonix and try the nexium 2)  elevate head of your bed if you can with 1-2 bricks  3)  we will do GI referral at check out  4)  we will do neurol ref at check out  5)  B12 shot today  Current Allergies (reviewed today): ! * HRT PATCHES   Medication Administration  Injection # 1:    Medication: Vit B12 1000 mcg    Diagnosis: VITAMIN B12 DEFICIENCY (ICD-266.2)    Route: IM    Site: L deltoid    Exp Date: 03/02/2011    Lot #: [id number]    Mfr: American Regent    Patient tolerated injection without complications    Given by: Lewanda Rife  LPN (November 27, 2009 12:35 PM)  Orders Added: 1)  Vit B12 1000 mcg [J3420] 2)  Admin of Therapeutic Inj  intramuscular or subcutaneous [96372] 3)  Neurology Referral [Neuro] 4)  Gastroenterology Referral [GI] 5)  Est. Patient Level IV [[address]]

## 2010-05-03 NOTE — Letter (Signed)
Summary: [facility]   Imported By: Lanelle Bal 04/03/2010 11:01:56  _____________________________________________________________________  External Attachment:    Type:   Image     Comment:   External Document

## 2010-05-03 NOTE — Procedures (Signed)
Summary: summary report  summary report   Imported By: Mirna Mires 01/22/2010 16:10:46  _____________________________________________________________________  External Attachment:    Type:   Image     Comment:   External Document

## 2010-05-03 NOTE — Assessment & Plan Note (Signed)
Summary: CLEARANCE FOR LEFT SHOULDER SURGERY   Vital Signs:  Patient profile:   75 year old female Height:      63 inches Weight:      157 pounds BMI:     27.91 Temp:     97.9 degrees F oral Pulse rate:   86 / minute Pulse rhythm:   regular BP sitting:   130 / 82  (right arm) Cuff size:   regular  Vitals Entered By: Linde Gillis CMA [facility] Dull) (April 17, 2010 3:11 PM) CC: surgical clearance   History of Present Illness: here for surgical clearance in prep for L shoulder surgery this will be a shoulder replacement -- rot cuff tear and joint OA deterioration  is very painful all the time  will be Dr Ranell Patrick in [place]   does not have surgery date yet   also needs B12 shot -missed her last one  still really tired all the time   has had R shoulder surgery in the past   wt is stable with bmi of 27  has had many surgeries in the past   HTN in good control 130/82 today  cardiac w/u in past neg/ normal stress cardiolite in 9/10  hx of dizziness with nl Ct of the head  that is much better - just occasions   drug all/intolerance no anesthia allergies but does get nauseated with it  better the last few surgeries    wants to inc vit D to 3000 international units daily   feels ok except fatige --no colds or flu or other symptoms  sleeping is fair  not a lot of exercise     Allergies: 1)  ! * Hrt Patches  Past History:  Past Medical History: Last updated: 11/27/2009 Anxiety Depression GERD Hypertension chronic back pain  OA  dizziness/vertigo   GI- [facility] cardiol- [facility]- [facility]  Past Surgical History: Last updated: 12/12/2008 Hysterectomy- age 30, ovarian cancer (1962) Cholecystectomy (1994) Right shoulder surgery Left knee surgery Incisional hernia repair after [facility]cy Carpal tunnel release- right Breast biopsy- benign Bunion/  morton's neuroma (06/2005) right foot colonoscopy- very difficult- supp with [facility] past hosp at 25 for colitis -  transfusion hemorrhoidectomy 7.09 EGD tight ues sphincter with dilatation 10/09 foot surgs TMT fusion and bunionectomy 6/10 CT head- small vessel chronic change/ no acute findings stress cardiolite 9/10  ortho -- Dr Lestine Box  Family History: Last updated: Nov 22, 2008 Father: deceased- ? CAD, HTN Mother: CVA Siblings: 2 brothers, 1 sister- sister has HTN,hyperlipidemia  Social History: Last updated: November 22, 2008 Widowed. 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  cares for mother in nursing home  Social wine No illicit drug use  Risk Factors: Smoking Status: never (10/05/2007)  Review of Systems General:  Denies fatigue and malaise. Eyes:  Denies blurring and eye irritation. CV:  Denies chest pain or discomfort, lightheadness, and palpitations. Resp:  Denies cough, shortness of breath, and wheezing. GI:  Denies abdominal pain, change in bowel habits, indigestion, and nausea. GU:  Denies dysuria and urinary frequency. MS:  Complains of joint pain, joint swelling, and stiffness; denies cramps and muscle weakness. Derm:  Denies itching, lesion(s), poor wound healing, and rash. Neuro:  Denies headaches, numbness, and tingling. Endo:  Denies cold intolerance, excessive thirst, excessive urination, and heat intolerance. Heme:  Denies abnormal bruising and bleeding.  Physical Exam  General:  Well-developed,well-nourished,in no acute distress; alert,appropriate and cooperative throughout examination Head:  normocephalic, atraumatic, and no abnormalities observed.  Eyes:  vision grossly intact, pupils equal, pupils round, and pupils reactive to light.  no conjunctival pallor, injection or icterus  Mouth:  pharynx pink and moist.   Neck:  supple with full rom and no masses or thyromegally, no JVD or carotid bruit  Chest Wall:  No deformities, masses, or tenderness noted. Lungs:  Normal respiratory effort, chest expands symmetrically.  Lungs are clear to auscultation, no crackles or wheezes. Heart:  Normal rate and regular rhythm. S1 and S2 normal without gallop, murmur, click, rub or other extra sounds. Abdomen:  Bowel sounds positive,abdomen soft and non-tender without masses, organomegaly or hernias noted. no renal bruits  Msk:  No deformity or scoliosis noted of thoracic or lumbar spine.  poor rom L shoulder  Pulses:  R and L carotid,radial,femoral,dorsalis pedis and posterior tibial pulses are full and equal bilaterally Extremities:  No clubbing, cyanosis, edema, or deformity noted with normal full range of motion of all joints.   Neurologic:  sensation intact to light touch, gait normal, and DTRs symmetrical and normal.   Skin:  Intact without suspicious lesions or rashes Cervical Nodes:  No lymphadenopathy noted Psych:  normal affect, talkative and pleasant    Impression & Recommendations:  Problem # 1:  PREOPERATIVE EXAMINATION (ICD-V72.84) Assessment New  for upcoming shoulder replacement surgery with Dr Ranell Patrick  no restrictions noted  had nl stress cardiolite in 9/10 well controlled HTN  stable wt  stable EKG   Orders: EKG w/ Interpretation (93000)  Problem # 2:  VITAMIN B12 DEFICIENCY (ICD-266.2) Assessment: Improved  B12 shot today- is overdue   Orders: Vit B12 1000 mcg (J3420) Admin of Therapeutic Inj  intramuscular or subcutaneous (11914)  Problem # 3:  HYPERTENSION (ICD-401.9) Assessment: Unchanged  good control without change   Her updated medication list for this problem includes:    Zebeta 5 Mg Tabs (Bisoprolol fumarate) .Marland Kitchen... 1/2 by mouth once daily    Dyazide 37.5-25 Mg Caps (Triamterene-hctz) .Marland Kitchen... 1 by mouth once daily  Orders: EKG w/ Interpretation (93000)  BP today: 130/82 Prior BP: 126/78 (12/26/2009)  Labs Reviewed: K+: 4.1 (09/16/2008) Creat: : 0.7 (03/10/2009)   Chol: 169 (09/16/2008)   HDL: 62.40 (09/16/2008)   LDL: 89 (09/16/2008)   TG: 86.0 (09/16/2008)  Complete  Medication List: 1)  Wellbutrin Sr 150 Mg Tb12 (Bupropion hcl) .... Take 2 by mouth daily 2)  Amitriptyline Hcl 100 Mg Tabs (Amitriptyline hcl) .... Take one by mouth at bedtime 3)  Alprazolam 0.25 Mg Tabs (Alprazolam) .... Take one by mouth q 4 hrs prn 4)  Zebeta 5 Mg Tabs (Bisoprolol fumarate) .... 1/2 by mouth once daily 5)  Dyazide 37.5-25 Mg Caps (Triamterene-hctz) .Marland Kitchen.. 1 by mouth once daily 6)  Librax 2.5-5 Mg Caps (Clidinium-chlordiazepoxide) .Marland Kitchen.. 1 by mouth before meals as needed 7)  Vitamin D 1000 Unit Tabs (Cholecalciferol) .... Take 3 by mouth once daily 8)  Cyanocobalamin 1000 Mcg/ml Soln (Cyanocobalamin) .... Take one injection every three months. 9)  Pantoprazole Sodium 40 Mg Tbec (Pantoprazole sodium) .... Once daily 10)  Hydrocodone-acetaminophen 5-325 Mg Tabs (Hydrocodone-acetaminophen) .... Take one tablet by mouth every 4-6 hours as needed pain  Patient Instructions: 1)  no restrictions for surgery  2)  make sure to tell the anethesia doctor you get nausea from the medication  3)  B12 shot today 4)  I will send note to Dr Ranell Patrick with EKG  5)  good luck with everything  6)  go ahead and increase vitamin D to 3000  international units per day    Medication Administration  Injection # 1:    Medication: Vit B12 1000 mcg    Diagnosis: VITAMIN B12 DEFICIENCY (ICD-266.2)    Route: IM    Site: R deltoid    Exp Date: 12/31/2011    Lot #: [id number]    Mfr: [employer] Regent    Patient tolerated injection without complications    Given by: Linde Gillis CMA [facility] Dull) (April 17, 2010 4:29 PM)  Orders Added: 1)  EKG w/ Interpretation [93000] 2)  Vit B12 1000 mcg [J3420] 3)  Admin of Therapeutic Inj  intramuscular or subcutaneous [96372] 4)  Est. Patient Level IV [81191]    Current Allergies (reviewed today): ! * HRT PATCHES   EKG  Procedure date:  04/17/2010  Findings:      NSR with rate of 77  no acute changes from previous EKG no acute changes in general

## 2010-05-03 NOTE — Miscellaneous (Signed)
Summary: Controlled Substance Agreement  Controlled Substance Agreement   Imported By: Lanelle Bal 12/06/2009 11:18:20  _____________________________________________________________________  External Attachment:    Type:   Image     Comment:   External Document

## 2010-05-03 NOTE — Assessment & Plan Note (Signed)
Summary: B-12 INJ/Shantina Chronister/[facility]  Nurse Visit   Allergies: 1)  ! * Hrt Patches  Medication Administration  Injection # 1:    Medication: Vit B12 1000 mcg    Diagnosis: VITAMIN B12 DEFICIENCY (ICD-266.2)    Route: IM    Site: L deltoid    Exp Date: 12/2010    Lot #: [id number]    Mfr: American Regent    Patient tolerated injection without complications    Given by: Lowella Petties CMA (April 04, 2009 3:02 PM)  Orders Added: 1)  Vit B12 1000 mcg [J3420] 2)  Admin of Therapeutic Inj  intramuscular or subcutaneous [96372]   Medication Administration  Injection # 1:    Medication: Vit B12 1000 mcg    Diagnosis: VITAMIN B12 DEFICIENCY (ICD-266.2)    Route: IM    Site: L deltoid    Exp Date: 12/2010    Lot #: [id number]    Mfr: American Regent    Patient tolerated injection without complications    Given by: Lowella Petties CMA (April 04, 2009 3:02 PM)  Orders Added: 1)  Vit B12 1000 mcg [J3420] 2)  Admin of Therapeutic Inj  intramuscular or subcutaneous [54098]

## 2010-05-03 NOTE — Letter (Signed)
Summary: [facility]  [facility]   Imported By: Sherian Rein 12/22/2009 08:14:54  _____________________________________________________________________  External Attachment:    Type:   Image     Comment:   External Document

## 2010-05-03 NOTE — Progress Notes (Signed)
Summary: rx refills  Phone Note Refill Request Call back at Home Phone [phone] Message from:  Patient on June 12, 2009 2:05 PM  Refills Requested: Medication #1:  ZEBETA 5 MG  TABS 1/2 by mouth once daily  Medication #2:  DYAZIDE 37.5-25 MG  CAPS 1 by mouth once daily  Medication #3:  PRILOSEC 20 MG CPDR 1 by mouth two times a day [facility]  Initial call taken by: Melody Comas,  June 12, 2009 2:07 PM    Prescriptions: PRILOSEC 20 MG CPDR (OMEPRAZOLE) 1 by mouth two times a day  #60 x 12   Entered by:   Delilah Shan CMA (AAMA)   Authorized by:   Judith Part MD   Signed by:   Delilah Shan CMA Duncan Dull) on 06/12/2009   Method used:   Electronically to        [facility] (retail)       [address]       [place], [place]  [address]       Ph: [phone]       Fax: [phone]   RxID:   [id number] DYAZIDE 37.5-25 MG  CAPS (TRIAMTERENE-HCTZ) 1 by mouth once daily  #30 x 12   Entered by:   Delilah Shan CMA (AAMA)   Authorized by:   Judith Part MD   Signed by:   Delilah Shan CMA Duncan Dull) on 06/12/2009   Method used:   Electronically to        [facility] (retail)       [address]       [place], [place]  [address]       Ph: [phone]       Fax: [phone]   RxID:   [id number] ZEBETA 5 MG  TABS (BISOPROLOL FUMARATE) 1/2 by mouth once daily  #15 x 12   Entered by:   Delilah Shan CMA (AAMA)   Authorized by:   Judith Part MD   Signed by:   Delilah Shan CMA Duncan Dull) on 06/12/2009   Method used:   Electronically to        [facility] (retail)       [address]       [place], [place]  [address]       Ph: [phone]       Fax: [phone]   RxID:   [id number]

## 2010-05-03 NOTE — Miscellaneous (Signed)
Summary: Cyanocobalanin 1031mcg/ml update  Clinical Lists Changes  Medications: Added new medication of CYANOCOBALAMIN 1000 MCG/ML SOLN (CYANOCOBALAMIN) take one injection every three months. Removed medication of CYANOCOBALAMIN 1000 MCG/ML SOLN (CYANOCOBALAMIN) one injection monthly Observations: Added new observation of MEDS REVIEW: Done (06/07/2009 10:44)     Current Allergies: ! * HRT PATCHES

## 2010-05-03 NOTE — Progress Notes (Signed)
Summary: when does pt need to come back?  Phone Note Call from Patient Call back at Home Phone [phone]   Caller: Patient Call For: Judith Part MD Summary of Call: Pt stopped by the office, wants to know when she is to see you again for a visit or labs.  Please advise. Initial call taken by: Lowella Petties CMA,  May 09, 2009 12:32 PM  Follow-up for Phone Call        please f/u with me in about a month- will review her studies in detail and check in  Follow-up by: Judith Part MD,  May 09, 2009 1:42 PM  Additional Follow-up for Phone Call Additional follow up Details #1::        Left message for patient to call back.Lewanda Rife LPN  May 09, 2009 3:53 PM   Patient notified as instructed by telephone. Scheduled appt with Dr Milinda Antis 06/06/09 at 12:30pm. Pt would like to get B12 injection at that appt also.Lewanda Rife LPN  May 09, 2009 4:27 PM

## 2010-05-03 NOTE — Assessment & Plan Note (Signed)
Summary: FOLLOW UP AFTER TEST/RI   Vital Signs:  Patient profile:   75 year old female Height:      63 inches Weight:      162.25 pounds BMI:     28.85 Temp:     99.2 degrees F oral Pulse rate:   80 / minute Pulse rhythm:   regular BP sitting:   136 / 72  (left arm) Cuff size:   regular  Vitals Entered By: Lewanda Rife LPN (June 06, 452 12:41 PM)  History of Present Illness: here for f/u after mri and mra   overall feels better than she did  is still fairly tired - but not quite as much  still gets dizzy -- not every day- and not as much as she used to  happens randomly with feeling of weakness and light headedness - holds on for a minute no falls thankfully  no spinning feeling - "just dizzy"   also had some GI tests  ibs with colon polyp and tics- put on librax -- has not filled it yet , too expensive   also epigastric pain and gerd on ppi and librax  ? as to what is causing burning in her throat    MRI/ MRA showed some small vessel dz evidence-- no acute stroke or aneurysm  ? atherosclerosis  some mucosal sinus thickening   overall is congested occas , but not all the time  some sniffing and clearing her throat  most of time mucous is clear / rarely little green  occasionally forehead pain that may be from sinuses  sometimes R side of her neck   fingers really hurt from OA ? can she occas take aleve or will it hurt stomach more?   Allergies: 1)  ! * Hrt Patches  Review of Systems General:  Complains of fatigue; denies chills, fever, loss of appetite, and malaise. Eyes:  Denies blurring, eye irritation, and eye pain. ENT:  Complains of hoarseness, nasal congestion, postnasal drainage, sinus pressure, and sore throat; denies earache. CV:  Denies chest pain or discomfort, palpitations, shortness of breath with exertion, and swelling of feet. Resp:  Complains of cough; denies pleuritic, shortness of breath, sputum productive, and wheezing. GI:  Complains of  indigestion; denies nausea and vomiting. GU:  Denies dysuria and hematuria. MS:  Complains of joint pain; denies joint redness and joint swelling. Derm:  Denies lesion(s), poor wound healing, and rash. Neuro:  Complains of poor balance; denies numbness, tingling, tremors, visual disturbances, and weakness. Psych:  mood is about the same. Endo:  Denies cold intolerance, excessive thirst, excessive urination, and heat intolerance. Heme:  Denies abnormal bruising and bleeding.  Physical Exam  General:  Well-developed,well-nourished,in no acute distress; alert,appropriate and cooperative throughout examination Head:  normocephalic, atraumatic, and no abnormalities observed.  no sinus or temporal tenderness  Eyes:  vision grossly intact, pupils equal, pupils round, pupils reactive to light, and no injection.   no nystagmus  Ears:  R ear normal and L ear normal.   Nose:  nares are congested and dry bilaat  Mouth:  pharynx pink and moist, no erythema, and no exudates.   Neck:  supple with full rom and no masses or thyromegally, no JVD or carotid bruit  Chest Wall:  No deformities, masses, or tenderness noted. Lungs:  Normal respiratory effort, chest expands symmetrically. Lungs are clear to auscultation, no crackles or wheezes. Heart:  Normal rate and regular rhythm. S1 and S2 normal without gallop, murmur, click,  rub or other extra sounds. Abdomen:  Bowel sounds positive,abdomen soft and non-tender without masses, organomegaly or hernias noted. Msk:  No deformity or scoliosis noted of thoracic or lumbar spine.  OA changes in her hands bilat  Pulses:  R and L carotid,radial,femoral,dorsalis pedis and posterior tibial pulses are full and equal bilaterally Extremities:  No clubbing, cyanosis, edema, or deformity noted with normal full range of motion of all joints.   Neurologic:  cranial nerves II-XII intact, strength normal in all extremities, sensation intact to light touch, gait normal, and DTRs  symmetrical and normal.   Skin:  Intact without suspicious lesions or rashes Cervical Nodes:  No lymphadenopathy noted Psych:  normal affect, talkative and pleasant  does not seem anxious    Impression & Recommendations:  Problem # 1:  UNSPECIFIED VITAMIN D DEFICIENCY (ICD-268.9) Assessment Improved will re check level on current vit D otc and update  tx seems to have helped her energy level  Orders: Venipuncture (91478) TLB-B12, Serum-Total ONLY (29562-Z30) T-Vitamin D (25-Hydroxy) (86578-46962) Specimen Handling (95284)  Problem # 2:  VITAMIN B12 DEFICIENCY (ICD-266.2) Assessment: Improved B12 shot today and check level  will see if we can dec frequency energy level is mildly improved  Orders: Vit B12 1000 mcg (J3420) Admin of Therapeutic Inj  intramuscular or subcutaneous (13244) Venipuncture (01027) TLB-B12, Serum-Total ONLY (25366-Y40) T-Vitamin D (25-Hydroxy) (34742-59563)  Problem # 3:  DIZZINESS (ICD-780.4) Assessment: Improved ongoing- but some imp rev mri/ mra today  pt pref not to return to Dr Thad Ranger if possible  some microvasc isch changes and sinus thickening  will try tx for sinus infx- if not imp consider ENT consult   Problem # 4:  SINUSITIS - ACUTE-NOS (ICD-461.9) Assessment: New atypical but with some inflam features on mri will tx with augmentin and update  if dizziness does not resolve will ref to ENT Her updated medication list for this problem includes:    Augmentin 875-125 Mg Tabs (Amoxicillin-pot clavulanate) .Marland Kitchen... 1 by mouth two times a day for 10 days for sinus infection  Complete Medication List: 1)  Wellbutrin Sr 150 Mg Tb12 (Bupropion hcl) .... Take 2 by mouth daily 2)  Amitriptyline Hcl 100 Mg Tabs (Amitriptyline hcl) .... Take one by mouth at bedtime 3)  Alprazolam 0.25 Mg Tabs (Alprazolam) .... Take one by mouth q 4 hrs prn 4)  Zebeta 5 Mg Tabs (Bisoprolol fumarate) .... 1/2 by mouth once daily 5)  Dyazide 37.5-25 Mg Caps  (Triamterene-hctz) .Marland Kitchen.. 1 by mouth once daily 6)  Prilosec 20 Mg Cpdr (Omeprazole) .Marland Kitchen.. 1 by mouth two times a day 7)  Cyanocobalamin 1000 Mcg/ml Soln (Cyanocobalamin) .... One injection monthly 8)  Librax 2.5-5 Mg Caps (Clidinium-chlordiazepoxide) .Marland Kitchen.. 1 by mouth before meals as needed 9)  Vitamin D 1000 Unit Tabs (Cholecalciferol) .... Take one tablet daily 10)  Augmentin 875-125 Mg Tabs (Amoxicillin-pot clavulanate) .Marland Kitchen.. 1 by mouth two times a day for 10 days for sinus infection   Patient Instructions: 1)  labs today for B12 and vit D  2)  B12 shot today  3)  take the augmentin for sinus infection 4)  update me by phone in 2 weeks -- if your dizziness is not gone -- I would like to have you see and ENT physician  5)  try to keep up healthy diet and exercise  Prescriptions: AUGMENTIN 875-125 MG TABS (AMOXICILLIN-POT CLAVULANATE) 1 by mouth two times a day for 10 days for sinus infection  #20 x 0  Entered and Authorized by:   Judith Part MD   Signed by:   Judith Part MD on 06/06/2009   Method used:   Print then Give to Patient   RxID:   [id number]   Current Allergies (reviewed today): ! * HRT PATCHES  Medication Administration  Injection # 1:    Medication: Vit B12 1000 mcg    Diagnosis: VITAMIN B12 DEFICIENCY (ICD-266.2)    Route: IM    Site: L deltoid    Exp Date: 12/31/2010    Lot #: [id number]    Mfr: American Regent    Patient tolerated injection without complications    Given by: Lewanda Rife LPN (June 06, 3084 12:47 PM)  Orders Added: 1)  Vit B12 1000 mcg [J3420] 2)  Admin of Therapeutic Inj  intramuscular or subcutaneous [96372] 3)  Venipuncture [36415] 4)  TLB-B12, Serum-Total ONLY [82607-B12] 5)  T-Vitamin D (25-Hydroxy) [57846-96295] 6)  Specimen Handling [99000] 7)  Est. Patient Level IV [28413]

## 2010-05-03 NOTE — Assessment & Plan Note (Signed)
Summary: "GERD-WORSENING,NAUSEA & DYSPHAGIA"   History of Present Illness Visit Type: Follow-up Visit Primary GI MD: Yancey Flemings MD Primary Latiffany Harwick: Judith Part MD Requesting Alohilani Levenhagen: Shepard General Chief Complaint: GERD, with chest pain that radiates to the back, nausea, dysphagia x 6 weeks; Nexium has not beeen effective History of Present Illness:   75 year old female with hypertension, osteoarthritis, anxiety/depression, GERD, adenomatous colon polyps,and multiple prior surgeries. Also, diarrhea predominant irritable bowel. She presents today regarding "worsening GERD". She is accompanied by her friend. She was last seen May 29, 2009 regarding the same symptoms. Complaints of burning as well as chest pain with radiation to the back unaffected by PPI, high-dose PPI, or antacids. She is status post cholecystectomy. Upper endoscopy and colonoscopy less than one year ago were unrevealing. She does have an incidental large caliber esophageal stricture. NO dysphagia.. 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 She denies neck pain, arm pain or numbness. She was previously prescribed Librax which she has not used significantly. She also takes aprazolam rarely. She is on Wellbutrin and amitriptyline for depression..she takes pantoprazole daily   GI Review of Systems    Reports acid reflux, bloating, chest pain, nausea, and  vomiting.      Denies abdominal pain, belching, dysphagia with liquids, dysphagia with solids, heartburn, loss of appetite, vomiting blood, weight loss, and  weight gain.      Reports black tarry stools, diarrhea, and  rectal bleeding.     Denies anal fissure, change in bowel habit, constipation, diverticulosis, fecal incontinence, heme positive stool, hemorrhoids, irritable bowel syndrome, jaundice, light color stool, liver problems, and  rectal pain.    Current Medications (verified): 1)  Wellbutrin Sr 150 Mg  Tb12  (Bupropion Hcl) .... Take 2 By Mouth Daily 2)  Amitriptyline Hcl 100 Mg  Tabs (Amitriptyline Hcl) .... Take One By Mouth At Bedtime 3)  Alprazolam 0.25 Mg  Tabs (Alprazolam) .... Take One By Mouth Q 4 Hrs Prn 4)  Zebeta 5 Mg  Tabs (Bisoprolol Fumarate) .... 1/2 By Mouth Once Daily 5)  Dyazide 37.5-25 Mg  Caps (Triamterene-Hctz) .Marland Kitchen.. 1 By Mouth Once Daily 6)  Librax 2.5-5 Mg Caps (Clidinium-Chlordiazepoxide) .Marland Kitchen.. 1 By Mouth Before Meals As Needed 7)  Vitamin D 1000 Unit  Tabs (Cholecalciferol) .... Take Two  Tablest Daily 8)  Cyanocobalamin 1000 Mcg/ml Soln (Cyanocobalamin) .... Take One Injection Every Three Months. 9)  Pantoprazole Sodium 40 Mg Tbec (Pantoprazole Sodium) .... Once Daily  Allergies (verified): 1)  ! * Hrt Patches  Past History:  Past Medical History: Reviewed history from 11/27/2009 and no changes required. Anxiety Depression GERD Hypertension chronic back pain  OA  dizziness/vertigo   GI- [facility] cardiol- [facility] neurol- [facility]  Past Surgical History: Reviewed history from 12/12/2008 and no changes required. Hysterectomy- age 86, ovarian cancer (1962) Cholecystectomy (1994) Right shoulder surgery Left knee surgery Incisional hernia repair after ccy Carpal tunnel release- right Breast biopsy- benign Bunion/  morton's neuroma (06/2005) right foot colonoscopy- very difficult- supp with [facility] hosp at 25 for colitis - transfusion hemorrhoidectomy 7.09 EGD tight ues sphincter with dilatation 10/09 foot surgs TMT fusion and bunionectomy 6/10 CT head- small vessel chronic change/ no acute findings stress cardiolite 9/10  ortho -- Dr Lestine Box  Family History: Reviewed history from 11/16/2008 and no changes required. Father: deceased- ? CAD, HTN Mother: CVA Siblings: 2 brothers, 1 sister- sister has HTN,hyperlipidemia  Social History: Reviewed history from 11/16/2008 and  no changes required. Widowed. Children: 1 daughter Occupation: self employed,  Interior and spatial designer. Patient has never smoked.  cares for mother in nursing home  Social wine No illicit drug use  Review of Systems       The patient complains of allergy/sinus, anxiety-new, arthritis/joint pain, back pain, cough, depression-new, fatigue, muscle pains/cramps, and sleeping problems.  The patient denies anemia, blood in urine, breast changes/lumps, change in vision, confusion, coughing up blood, fainting, fever, headaches-new, hearing problems, heart murmur, heart rhythm changes, itching, menstrual pain, night sweats, nosebleeds, pregnancy symptoms, shortness of breath, skin rash, sore throat, swelling of feet/legs, swollen lymph glands, thirst - excessive , urination - excessive , urination changes/pain, urine leakage, vision changes, and voice change.    Vital Signs:  Patient profile:   75 year old female Height:      63 inches Weight:      158.25 pounds BMI:     28.13 Pulse rate:   80 / minute Pulse rhythm:   regular BP sitting:   126 / 78  (left arm) Cuff size:   regular  Vitals Entered By: June McMurray CMA Duncan Dull) (December 26, 2009 9:55 AM)  Physical Exam  General:  Well developed, well nourished, no acute distress. Head:  Normocephalic and atraumatic. Eyes:  PERRLA, no icterus. Mouth:  No deformity or lesions. Neck:  Supple; no masses or thyromegaly. Lungs:  Clear throughout to auscultation. Heart:  Regular rate and rhythm; no murmurs, rubs,  or bruits. Abdomen:  Soft, nontender and nondistended. No masses, hepatosplenomegaly or hernias noted. Normal bowel sounds. Msk:  normal posture Pulses:  Normal pulses noted. Extremities:  no edema Neurologic:  alert and oriented Skin:  no jaundice Psych:  Alert and cooperative. Normal mood and affect.   Impression & Recommendations:  Problem # 1:  CHEST PAIN-PRECORDIAL (ICD-786.51) Chronic chest pain. Atypical. No response to PPI therapy. Status post cholecystectomy. Unrevealing upper endoscopy. Features did not  suggest GI cause. No GI cause suspected. May be musculoskeletal, spasm, or functional. I discussed this candidlywith the patient. I do not think that additional or repeated GI workup is indicated. Nor do I think changing GERD therapies is either indicated or helpful.  Plan: #1. Consider antispasmodic Librax for pain p.r.n. #2. Consider benzodiazepine p.r.n. pain #3. If pain persists, return to Dr. Milinda Antis to evaluate and will treat non-GI causes such as musculoskeletal or functional.  Problem # 2:  GERD (ICD-530.81) classic GERD seemingly under good control PPI.  plan: #1. Reflux precautions #2. Continue pantoprazole   Problem # 3:  IBS (ICD-564.1) ongoing. Recommend fiber, Librax p.r.n.,  Problem # 4:  COLONIC POLYPS, HX OF (ICD-V12.72) surveillance up-to-date. Do for followup in 5 years.  Problem # 5:  ESOPHAGEAL STRICTURE (ICD-530.3) currently asymptomatic  Patient Instructions: 1)  Please continue current medications.  2)  Please schedule a follow-up appointment as needed.  3)  Copy sent to : Judith Part MD 4)  The medication list was reviewed and reconciled.  All changed / newly prescribed medications were explained.  A complete medication list was provided to the patient / caregiver.

## 2010-05-22 ENCOUNTER — Encounter (HOSPITAL_COMMUNITY)
Admission: RE | Admit: 2010-05-22 | Discharge: 2010-05-22 | Disposition: A | Discharge status: Home / Self Care | Payer: Medicare Other | Source: Ambulatory Visit | Attending: Orthopedic Surgery | Admitting: Orthopedic Surgery

## 2010-05-22 ENCOUNTER — Other Ambulatory Visit (HOSPITAL_COMMUNITY): Payer: Self-pay | Admitting: Orthopedic Surgery

## 2010-05-22 DIAGNOSIS — Z01812 Encounter for preprocedural laboratory examination: Secondary | ICD-10-CM | POA: Insufficient documentation

## 2010-05-22 DIAGNOSIS — Z01811 Encounter for preprocedural respiratory examination: Secondary | ICD-10-CM

## 2010-05-22 DIAGNOSIS — Z01818 Encounter for other preprocedural examination: Secondary | ICD-10-CM | POA: Insufficient documentation

## 2010-05-22 LAB — CBC
HCT: 34.5 % — ABNORMAL LOW (ref 36.0–46.0)
Hemoglobin: 11.7 g/dL — ABNORMAL LOW (ref 12.0–15.0)
MCH: 31.2 pg (ref 26.0–34.0)
MCV: 92 fL (ref 78.0–100.0)
RBC: 3.75 MIL/uL — ABNORMAL LOW (ref 3.87–5.11)

## 2010-05-22 LAB — SURGICAL PCR SCREEN: MRSA, PCR: NEGATIVE

## 2010-05-22 LAB — DIFFERENTIAL
Lymphs Abs: 1.9 10*3/uL (ref 0.7–4.0)
Monocytes Relative: 7 % (ref 3–12)
Neutro Abs: 5.7 10*3/uL (ref 1.7–7.7)
Neutrophils Relative %: 68 % (ref 43–77)

## 2010-05-22 LAB — BASIC METABOLIC PANEL
CO2: 26 mEq/L (ref 19–32)
Calcium: 10 mg/dL (ref 8.4–10.5)
Chloride: 102 mEq/L (ref 96–112)
GFR calc Af Amer: >60 mL/min (ref >60)
Glucose, Bld: 97 mg/dL (ref 70–99)
Potassium: 3.7 mEq/L (ref 3.5–5.1)
Sodium: 138 mEq/L (ref 135–145)

## 2010-05-22 LAB — URINALYSIS, ROUTINE W REFLEX MICROSCOPIC
Bilirubin Urine: NEGATIVE
Hgb urine dipstick: NEGATIVE
Ketones, ur: NEGATIVE mg/dL
Nitrite: NEGATIVE
Protein, ur: NEGATIVE mg/dL
Urobilinogen, UA: 1 mg/dL (ref 0.0–1.0)

## 2010-05-22 LAB — ABO/RH: ABO/RH(D): A NEG

## 2010-05-22 LAB — TYPE AND SCREEN
ABO/RH(D): A NEG
Antibody Screen: NEGATIVE

## 2010-05-22 LAB — APTT: aPTT: 30 seconds (ref 24–37)

## 2010-05-22 LAB — URINE MICROSCOPIC-ADD ON

## 2010-05-23 ENCOUNTER — Telehealth (INDEPENDENT_AMBULATORY_CARE_PROVIDER_SITE_OTHER): Payer: Self-pay | Admitting: *Deleted

## 2010-05-25 ENCOUNTER — Inpatient Hospital Stay (HOSPITAL_COMMUNITY): Payer: Medicare Other

## 2010-05-25 ENCOUNTER — Observation Stay (HOSPITAL_COMMUNITY)
Admission: RE | Admit: 2010-05-25 | Discharge: 2010-05-28 | DRG: 483 | Disposition: A | Discharge status: Home Health | Payer: Medicare Other | Source: Ambulatory Visit | Attending: Orthopedic Surgery | Admitting: Orthopedic Surgery

## 2010-05-25 DIAGNOSIS — Z7902 Long term (current) use of antithrombotics/antiplatelets: Secondary | ICD-10-CM | POA: Insufficient documentation

## 2010-05-25 DIAGNOSIS — M67919 Unspecified disorder of synovium and tendon, unspecified shoulder: Principal | ICD-10-CM | POA: Diagnosis present

## 2010-05-25 DIAGNOSIS — I10 Essential (primary) hypertension: Secondary | ICD-10-CM | POA: Diagnosis present

## 2010-05-25 DIAGNOSIS — K219 Gastro-esophageal reflux disease without esophagitis: Secondary | ICD-10-CM | POA: Diagnosis present

## 2010-05-25 DIAGNOSIS — M719 Bursopathy, unspecified: Principal | ICD-10-CM | POA: Diagnosis present

## 2010-05-25 DIAGNOSIS — Z79899 Other long term (current) drug therapy: Secondary | ICD-10-CM

## 2010-05-25 DIAGNOSIS — D62 Acute posthemorrhagic anemia: Secondary | ICD-10-CM | POA: Diagnosis not present

## 2010-05-26 LAB — CBC
HCT: 26.9 % — ABNORMAL LOW (ref 36.0–46.0)
MCHC: 33.8 g/dL (ref 30.0–36.0)
Platelets: 228 10*3/uL (ref 150–400)
RDW: 12.6 % (ref 11.5–15.5)

## 2010-05-26 LAB — BASIC METABOLIC PANEL
BUN: 9 mg/dL (ref 6–23)
Calcium: 8.5 mg/dL (ref 8.4–10.5)
GFR calc non Af Amer: >60 mL/min (ref >60)
Glucose, Bld: 103 mg/dL — ABNORMAL HIGH (ref 70–99)
Sodium: 137 mEq/L (ref 135–145)

## 2010-05-28 LAB — HEMOGLOBIN AND HEMATOCRIT, BLOOD: Hemoglobin: 8.8 g/dL — ABNORMAL LOW (ref 12.0–15.0)

## 2010-05-29 NOTE — Discharge Summary (Signed)
  Jodi Bartlett, Jodi Bartlett                 ACCOUNT NO.:  [id number]  MEDICAL RECORD NO.:  [id number]           PATIENT TYPE:  I  LOCATION:  5037                         FACILITY:  [facility]  PHYSICIAN:  Almedia Balls. Ranell Patrick, M.D. DATE OF BIRTH:  22-Feb-1936  DATE OF ADMISSION:  05/25/2010 DATE OF DISCHARGE:  05/27/2010                              DISCHARGE SUMMARY   ADMISSION DIAGNOSIS:  Left shoulder pain secondary to rotator cuff arthropathy.  DISCHARGE DIAGNOSIS:  Left shoulder pain secondary to rotator cuff arthropathy, status post reverse total shoulder arthroplasty.  BRIEF HISTORY:  The patient is a 75 year old female with worsening left shoulder pain and function secondary to rotator cuff arthropathy.  The patient has elected surgery to decrease pain and increase function.  PROCEDURE:  The patient had left total shoulder arthroplasty to reverse by Dr. Malon Kindle on May 25, 2010.  Assistant was [employer], PA-C.  General anesthesia was used.  No complications.  HOSPITAL COURSE:  The patient was admitted on May 25, 2010, for the above-stated procedure which she tolerated well.  After adequate time in Postanesthesia Care Unit, she was transferred to 5000.  On postop day #1, the patient was doing fairly, did drop her hemoglobin slightly down to 9.1 and 27.  The patient was stable and asymptomatic.  She was able to work with Occupational Therapy and Physical Therapy over the next couple of days.  We did keep her until postop day #3, mainly to check H&H and get small occupational therapy before she went home.  The patient is stable, otherwise the patient is afebrile and doing quite well.  DISCHARGE PLAN:  The patient will be discharged home with home health physical therapy and home health nursing on May 28, 2010.  CONDITION:  Stable.  DIET:  Regular.  DISCHARGE MEDICATIONS: 1. Percocet 5/325 one to two tablets q.4-6 h. p.r.n. pain. 2. Robaxin 500 mg p.o. q.6  h. 3. The patient will continue on her Elavil 100 mg p.o. nightly. 4. Zebeta 2.5 mg daily. 5. Wellbutrin 150 mg b.i.d. 6. Protonix 40 mg daily. 7. Xanax 0.25 mg q.4 h. p.r.n.  FOLLOWUP:  The patient will follow back up with Dr. Malon Kindle in 2 weeks; and the patient does have no drug allergies.     Thomas B. Dixon, P.A.   ______________________________ Almedia Balls. Ranell Patrick, M.D.    TBD/MEDQ  D:  05/28/2010  T:  05/28/2010  Job:  [id number]  Electronically Signed by Standley Dakins P.A. on 05/29/2010 08:42:48 AM Electronically Signed by Malon Kindle  on 05/29/2010 12:55:36 PM

## 2010-05-29 NOTE — Progress Notes (Signed)
Summary: Records Request  Faxed Stress to [place] at [facility] ([phone]). Debby Freiberg  May 23, 2010 12:00 PM

## 2010-05-29 NOTE — Op Note (Signed)
Jodi Bartlett, Jodi Bartlett                 ACCOUNT NO.:  [id number]  MEDICAL RECORD NO.:  [id number]           PATIENT TYPE:  I  LOCATION:  5037                         FACILITY:  [facility]  PHYSICIAN:  Almedia Balls. Ranell Patrick, M.D. DATE OF BIRTH:  Mar 09, 1936  DATE OF PROCEDURE:  05/25/2010 DATE OF DISCHARGE:                              OPERATIVE REPORT   PREOPERATIVE DIAGNOSIS:  Left shoulder rotator cuff tear arthropathy.  POSTOPERATIVE DIAGNOSIS:  Left shoulder rotator cuff tear arthropathy.  PROCEDURE PERFORMED:  Left shoulder reverse total shoulder arthroplasty using DePuy Delta Xtend prosthesis.  ATTENDING SURGEON:  Almedia Balls. Ranell Patrick, MD.  ASSISTANT:  Donnie Coffin. Dixon, PA.  ANESTHESIA:  General anesthesia plus interscalene block anesthesia was used.  ESTIMATED BLOOD LOSS:  Minimal.  FLUID REPLACEMENT:  1200 mL of crystalloid.  INSTRUMENT COUNT:  Correct.  No complications.  Preoperative antibiotics were given.  INDICATIONS:  The patient is a 75 year old female with history of worsening left shoulder pain secondary to rotator cuff arthropathy.  The patient has had progressive pain and loss of function and presents now for operative treatment to restore function and eliminate pain into her left shoulder.  Informed consent was obtained.  DESCRIPTION OF PROCEDURE:  After an adequate level of anesthesia was achieved, the patient was positioned in a modified beach-chair position. The left shoulder was sterilely prepped and draped in the usual manner. Anterior deltopectoral approach was utilized starting at the coracoid process and extending down to the anterior humeral shaft using a 10 blade scalpel.  Subcutaneous dissection with Bovie electrocautery. Cephalic vein identified and taken laterally to the deltoid.  Pectoralis taken medially.  Upper centimeter of pectoralis tendon of the humerus released.  We then identified the conjoined tendon and took that medially and then the  subscapularis was released off the lesser tuberosity.  Rotator cuff was completely torn.  Advanced arthritic changes noted.  Extensive soft tissue dissection from the medial humeral shaft.  Stay sutures put in the subscapularis and anteroinferior capsule removed with axillary nerve protected.  The biceps tenotomized and then removed.  The rotator cuff again was absent.  We released a little bit of teres minor posteriorly, progressively externally rotated the humerus.  We then delivered the humerus out of the wound.  We reamed up to size 12 and then made our humeral head cut using the 12 intramedullary guide, set on 10 degrees of retroversion.  Once we made that cut, we placed a shim on the top of that protected bone, retracted the humerus posteriorly, removed posterior capsule, and released 360 degrees around the glenoid.  We removed the remaining cartilage, which was not much.  Using a Cobb elevator, placed our central guide pin and then reamed for the metaglene.  We then drilled out the central peg hole, removed peripheral bone with the hand reamer, and then placed our metaglene in position, impacting that, and getting good purchase in the bone. We then placed a 48 inferior screw, which was locked, and a 30 superior screw which was locked, and we can only get a posterior screw which was an 18, nonlocked.  Again, we  had excellent purchase of the metaglene nice and stable.  We then placed our 38 standard glenosphere and screwed that into position.  Once that was secured, we checked again the axillary nerve and all soft tissue to make sure none was incarcerated between the glenosphere and the metaglene.  When I was verified, we directed our attention back towards the humerus.  We performed a metaphyseal reaming for size 1 epi left.  At this point, we trialed with the 12 stem with the size 1 epi left metaphysis, and that was again set on between 5 and 10 degrees of retroversion and  impacted that in position.  We then trialed with a +3 and then +6, the +6 was perfect with a nice tension on its conjoined tendon, absolutely no shuck, no gap, with progressive external rotation.  Delivered the humerus out of the wound, removed the trial prosthesis, thoroughly irrigated the wound and the humerus, and then we using impaction grafting technique with morselized cancellous bone graft, impacting that stem, gaining excellent purchase, and again right around 5 degrees of retroversion with that impacted in place.  We then placed the trial +6 liner and 38 +6, reduced the shoulder, again very happy with soft tissue balance.  We removed the trial and inserted the real polyethylene insert which was a 38 +6, reduced the shoulder, nice and stable, full arc of motion, no impingement, no gapping, axillary nerve palpated, little tight, but not excessively so, and no other soft tissue caught in between the bearing surfaces.  Thorough irrigation of the entire wound and removal of remaining subscapularis using the Bovie, leg and the remainder retract under the conjoined tendon, and then we went and closed the deltopectoral interval with 0 Vicryl suture followed by 2-0 Vicryl subcutaneous closure, 4-0 Monocryl of skin.  Steri-Strips applied followed by sterile dressing.  The patient tolerated the surgery well.     Almedia Balls. Ranell Patrick, M.D.     SRN/MEDQ  D:  05/25/2010  T:  05/26/2010  Job:  [id number]  Electronically Signed by Malon Kindle  on 05/29/2010 12:55:33 PM

## 2010-06-25 ENCOUNTER — Other Ambulatory Visit: Payer: Self-pay | Admitting: *Deleted

## 2010-06-25 MED ORDER — PANTOPRAZOLE SODIUM 40 MG PO TBEC: 40.0000 mg | DELAYED_RELEASE_TABLET | Freq: Every day | ORAL | Status: DC

## 2010-07-03 ENCOUNTER — Other Ambulatory Visit: Payer: Self-pay | Admitting: *Deleted

## 2010-07-03 MED ORDER — BISOPROLOL FUMARATE 5 MG PO TABS: ORAL_TABLET | ORAL | Status: DC

## 2010-07-25 ENCOUNTER — Other Ambulatory Visit: Payer: Self-pay | Admitting: *Deleted

## 2010-07-25 MED ORDER — TRIAMTERENE-HCTZ 37.5-25 MG PO CAPS: 1.0000 | ORAL_CAPSULE | Freq: Every day | ORAL | Status: DC

## 2010-08-17 NOTE — Op Note (Signed)
Jodi Bartlett, Jodi Bartlett                 ACCOUNT NO.:  [id number]   MEDICAL RECORD NO.:  [id number]          PATIENT TYPE:  AMB   LOCATION:  [facility]                         FACILITY:  [facility]   PHYSICIAN:  Currie Paris, M.D.DATE OF BIRTH:  20-Aug-1935   DATE OF PROCEDURE:  06/20/2004  DATE OF DISCHARGE:                                 OPERATIVE REPORT   PREOPERATIVE DIAGNOSIS:  Left nipple inversion with atypical cells seen on  punch biopsy of nipple.   POSTOPERATIVE DIAGNOSIS:  Left nipple inversion with atypical cells seen on  punch biopsy of nipple.   OPERATION:  Left breast biopsy.   SURGEON:  Currie Paris, M.D.   ANESTHESIA:  General.   CLINICAL HISTORY:  This patient is a 75 year old lady who has developed some  recent nipple changes with some slight inversion. There is no mass palpable  and the mammogram was negative. Nevertheless, because this was a recent  development, a small punch biopsy was done and some atypical cells seen. We  therefore elected to get a larger biopsy to rule out developing Paget's  disease.   DESCRIPTION OF PROCEDURE:  The patient seen in the holding area and had no  further questions. Left breast was identified and marked by the patient and  myself as the operative site.   She was taken to have room and after satisfactory general anesthesia had  been obtained, the breast was prepped and draped. The timeout occurred.   I infiltrated the area around the nipple with 0.5% plain Marcaine.  I made a  curvilinear incision at the medial nipple margin, elevated the subareolar  skin over to the nipple and divide the breast tissue from underneath the  very bottom dermis of the nipple. I then took a portion of ductal tissue  directly under the areola as the major portion of my biopsy. This was all  done with cautery. Bleeders were coagulated. Once everything appeared to be  dry, I inspected the undersurface of the nipple and using the scalpel, took  another biopsy directly from the dermis of the nipple to be sure that we had  that area sampled as well.   Once everything appeared to be dry, after checking again for hemostasis, I  closed the deeper layers with some 3-0 Vicryl. I put a pursestring around  the nipple to evert it and tied that down. Skin was closed with 4-0 Monocryl  subcuticular and Dermabond.   The patient tolerated the procedure well.  There were no complications. All  counts were correct.      CJS/MEDQ  D:  06/20/2004  T:  06/20/2004  Job:  [id number]   cc:   Marne A. Milinda Antis, M.D. [facility]

## 2010-09-19 ENCOUNTER — Other Ambulatory Visit: Payer: Self-pay | Admitting: Orthopedic Surgery

## 2010-09-19 DIAGNOSIS — M25572 Pain in left ankle and joints of left foot: Secondary | ICD-10-CM

## 2010-09-19 DIAGNOSIS — M25512 Pain in left shoulder: Secondary | ICD-10-CM

## 2010-09-25 ENCOUNTER — Other Ambulatory Visit: Payer: Medicare Other

## 2010-09-26 ENCOUNTER — Other Ambulatory Visit: Payer: Medicare Other

## 2010-10-17 ENCOUNTER — Encounter: Payer: Self-pay | Admitting: Cardiovascular Disease

## 2010-11-12 ENCOUNTER — Other Ambulatory Visit: Payer: Self-pay | Admitting: *Deleted

## 2010-11-12 MED ORDER — BISOPROLOL FUMARATE 5 MG PO TABS: ORAL_TABLET | ORAL | Status: DC

## 2010-12-10 ENCOUNTER — Other Ambulatory Visit: Payer: Self-pay

## 2010-12-10 MED ORDER — BISOPROLOL FUMARATE 5 MG PO TABS: ORAL_TABLET | ORAL | Status: DC

## 2010-12-10 NOTE — Telephone Encounter (Signed)
[facility] faxed refill request Bisoprolol Fumarate 5 mg # 15 x 1 with note pt needs to call for appt.

## 2011-01-08 ENCOUNTER — Other Ambulatory Visit: Payer: Self-pay

## 2011-01-08 MED ORDER — TRIAMTERENE-HCTZ 37.5-25 MG PO CAPS: 1.0000 | ORAL_CAPSULE | Freq: Every day | ORAL | Status: DC

## 2011-01-08 NOTE — Telephone Encounter (Signed)
[facility] faxed refill request Triamterene HCTZ 37.5/25 mg # 30 x 3 with note pt needs to call for appt.

## 2011-02-06 ENCOUNTER — Other Ambulatory Visit: Payer: Self-pay

## 2011-02-06 MED ORDER — BISOPROLOL FUMARATE 5 MG PO TABS: ORAL_TABLET | ORAL | Status: DC

## 2011-02-06 NOTE — Telephone Encounter (Signed)
Pt called in because [facility] had told pt she had to call office before refill Zebeta. Dr Milinda Antis said OK to refill # 15 with 3 refills. Dr Milinda Antis saw pt 04/17/10. Medication phoned to [facility] as instructed. Left v/m for pt to call back.

## 2011-02-06 NOTE — Telephone Encounter (Signed)
Left vm for pt to callback 

## 2011-02-07 NOTE — Telephone Encounter (Signed)
Left vm for pt to callback 

## 2011-02-08 NOTE — Telephone Encounter (Signed)
Left v/m for pt to call back. [facility] said rx was delivered to her home on 02/06/11.

## 2011-02-11 NOTE — Telephone Encounter (Signed)
Unable to reach pt again by phone today. Mailed pt a letter to advise her of medication being sent to [facility].

## 2011-05-14 ENCOUNTER — Other Ambulatory Visit: Payer: Self-pay | Admitting: *Deleted

## 2011-05-14 MED ORDER — TRIAMTERENE-HCTZ 37.5-25 MG PO CAPS: 1.0000 | ORAL_CAPSULE | Freq: Every day | ORAL | Status: DC

## 2011-06-14 ENCOUNTER — Other Ambulatory Visit: Payer: Self-pay | Admitting: *Deleted

## 2011-06-14 MED ORDER — BISOPROLOL FUMARATE 5 MG PO TABS: ORAL_TABLET | ORAL | Status: DC

## 2011-06-14 NOTE — Telephone Encounter (Signed)
Schedule f/u please this spring when able Will refill electronically

## 2011-06-14 NOTE — Telephone Encounter (Signed)
Jodi Bartlett please schedule f/u this spring when pt is able. Thanks [employer]

## 2011-06-14 NOTE — Telephone Encounter (Signed)
Patient not seen in over 1 year okay to refill? 

## 2011-06-18 NOTE — Telephone Encounter (Signed)
L/M for pt to call back and set appt in Spring

## 2011-09-17 ENCOUNTER — Other Ambulatory Visit: Payer: Self-pay | Admitting: *Deleted

## 2011-09-17 MED ORDER — TRIAMTERENE-HCTZ 37.5-25 MG PO CAPS: 1.0000 | ORAL_CAPSULE | Freq: Every day | ORAL | Status: DC

## 2011-09-17 NOTE — Telephone Encounter (Signed)
30-day supply given--patient overdue for OV [last 07.17.2012-No future appt scheduled]/SLS *PATIENT OVERDUE FOR OFFICE VISIT-NO FURTHER REFILLS WITHOUT PRIOR MD VISIT*

## 2011-10-23 ENCOUNTER — Encounter: Payer: Self-pay | Admitting: Internal Medicine

## 2011-10-23 ENCOUNTER — Ambulatory Visit (INDEPENDENT_AMBULATORY_CARE_PROVIDER_SITE_OTHER): Payer: Medicare Other | Admitting: Family Medicine

## 2011-10-23 ENCOUNTER — Encounter: Payer: Self-pay | Admitting: Family Medicine

## 2011-10-23 VITALS — BP 149/92 | HR 100 | Temp 98.1°F | Ht 64.0 in | Wt 153.8 lb

## 2011-10-23 DIAGNOSIS — K219 Gastro-esophageal reflux disease without esophagitis: Secondary | ICD-10-CM

## 2011-10-23 DIAGNOSIS — F3289 Other specified depressive episodes: Secondary | ICD-10-CM

## 2011-10-23 DIAGNOSIS — F411 Generalized anxiety disorder: Secondary | ICD-10-CM

## 2011-10-23 DIAGNOSIS — E559 Vitamin D deficiency, unspecified: Secondary | ICD-10-CM

## 2011-10-23 DIAGNOSIS — E538 Deficiency of other specified B group vitamins: Secondary | ICD-10-CM

## 2011-10-23 DIAGNOSIS — I10 Essential (primary) hypertension: Secondary | ICD-10-CM

## 2011-10-23 DIAGNOSIS — F329 Major depressive disorder, single episode, unspecified: Secondary | ICD-10-CM

## 2011-10-23 MED ORDER — BISOPROLOL FUMARATE 5 MG PO TABS: ORAL_TABLET | ORAL | Status: DC

## 2011-10-23 MED ORDER — AMITRIPTYLINE HCL 100 MG PO TABS: 100.0000 mg | ORAL_TABLET | Freq: Every day | ORAL | Status: DC

## 2011-10-23 MED ORDER — TRIAMTERENE-HCTZ 37.5-25 MG PO CAPS: 1.0000 | ORAL_CAPSULE | Freq: Every day | ORAL | Status: DC

## 2011-10-23 NOTE — Progress Notes (Signed)
Subjective:    Patient ID: Jodi Bartlett, female    DOB: [birth year], 76 y.o.   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 VITAMIN B12 DEFICIENCY  . UNSPECIFIED VITAMIN D DEFICIENCY  . ANXIETY  . DEPRESSION  . HYPERTENSION  . ESOPHAGEAL STRICTURE  . GERD  . DIVERTICULOSIS-COLON  . IBS  . BACK PAIN  . BUNION  . COLONIC POLYPS, HX OF  . ULCERATIVE COLITIS, HX OF   Past Medical History  Diagnosis Date  . Anxiety   . Depressed   . GERD (gastroesophageal reflux disease)   . HTN (hypertension)   . Back pain   . OA (ocular albinism)   . Dizziness     vertigo   Past Surgical History  Procedure Date  . Hysterectomy (other)     age 28; ovarian cancer (1962)  . Cholecystectomy 1994  . Right shoulder surgery   . Left knee surgery   . Incisional hernia repair     after ccy  . Carpal tunnel release-right   . Breast biopsy- benign   . Bunion/morton's neuroma 4/07    right foot   . Colonoscopy     very difficul- supp with BE past  . Colitis transfusion     hosp at 25  . Hemorrhoid surgery   . Egd tight ues sphincter 7/09    with dialtion  . Foot surgery 10/09    TMT fusion and  bunionectomy  . Ct head (other) 6/10    small vessel chronic change/ no acute findings  . Stress cardiolite 9/10   History  Substance Use Topics  . Smoking status: Never Smoker   . Smokeless tobacco: Not on file  . Alcohol Use: Yes     social wine    Family History  Problem Relation Age of Onset  . Coronary artery disease Father   . Hypertension Father   . Hypertension Sister   . Hyperlipidemia Sister    No Known Allergies Current Outpatient Prescriptions on File Prior to Visit  Medication Sig Dispense Refill  . ALPRAZolam (XANAX) 0.25 MG tablet Take 0.25 mg by mouth every 4 (four) hours as needed.        Marland Kitchen amitriptyline (ELAVIL) 100 MG tablet Take 1 tablet (100 mg total) by mouth at bedtime.  90 tablet  3  . bisoprolol (ZEBETA) 5 MG tablet Take 1/2 tablet by mouth daily  45 tablet  3  . buPROPion (WELLBUTRIN SR) 150 MG 12 hr tablet Take  150 mg by mouth daily. Take 2       . clidinium-chlordiazePOXIDE (LIBRAX) 2.5-5 MG per capsule Take 1 capsule by mouth. Before meals as needed       . HYDROcodone-acetaminophen (NORCO) 5-325 MG per tablet Take 1 tablet by mouth. Every 4-6 hours prn pain       . triamterene-hydrochlorothiazide (DYAZIDE) 37.5-25 MG per capsule Take 1 each (1 capsule total) by mouth daily.  90 capsule  3  . cholecalciferol (VITAMIN D) 1000 UNITS tablet Take 1,000 Units by mouth daily. Take 3 tabs       . pantoprazole (PROTONIX) 40 MG tablet Take 1 tablet (40 mg total) by mouth daily.  30 tablet  11     Review of Systems Review of Systems  Constitutional: Negative for fever, appetite change, fatigue and unexpected weight change.  Eyes: Negative for pain and visual disturbance.  Respiratory: Negative for cough and shortness of breath.   Cardiovascular: Negative for cp or palpitations    Gastrointestinal: Negative for nausea, diarrhea and constipation.  Genitourinary: Negative for urgency and frequency.  Skin: Negative for pallor or rash   MSK pos for some  shoulder pain and loss of rom with activity Neurological: Negative for weakness, light-headedness, numbness and headaches.  Hematological: Negative for adenopathy. Does not bruise/bleed easily.  Psychiatric/Behavioral: Negative for dysphoric mood. The patient is not nervous/anxious.         Objective:   Physical Exam  Constitutional: She appears well-developed and well-nourished. No distress.  HENT:  Head: Normocephalic and atraumatic.  Mouth/Throat: Oropharynx is clear and moist.  Eyes: Conjunctivae and EOM are normal. Pupils are equal, round, and reactive to light. No scleral icterus.  Neck: Normal range of motion. Neck supple. No JVD present. Carotid bruit is not present. No thyromegaly present.  Cardiovascular: Normal rate, regular rhythm, normal heart sounds and intact distal pulses.  Exam reveals no gallop.   Pulmonary/Chest: Breath sounds normal. No respiratory distress. She has no wheezes.  Abdominal: Soft. Bowel sounds are normal. She exhibits no distension, no abdominal bruit and no mass. There is no tenderness.  Musculoskeletal: She exhibits no edema.       Limited rom L shoulder   Lymphadenopathy:    She has no cervical adenopathy.  Neurological: She is alert. She has normal reflexes. No cranial nerve deficit. She exhibits normal muscle tone. Coordination normal.  Skin: Skin is warm and dry. No rash noted. No erythema. No pallor.  Psychiatric: She has a normal mood and affect.          Assessment & Plan:

## 2011-10-23 NOTE — Patient Instructions (Addendum)
Labs today- will update you Schedule annual exam when able  Start back on bp medicine  We will do GI referral at check out

## 2011-10-23 NOTE — Assessment & Plan Note (Signed)
In pt with gerd and on PPI Level today

## 2011-10-23 NOTE — Assessment & Plan Note (Signed)
Level today with otc supplement Annual exam planned

## 2011-10-23 NOTE — Assessment & Plan Note (Signed)
Mood stable anx/dep on current meds Elavil refilled  Lot of stress but good coping tech

## 2011-10-23 NOTE — Assessment & Plan Note (Signed)
Out of med Lab today Start back F/u planned  Disc lifestyle change

## 2011-10-23 NOTE — Assessment & Plan Note (Signed)
Worse with recurrent stricture symptoms - will make appt with GI for eval

## 2011-10-24 LAB — COMPREHENSIVE METABOLIC PANEL
ALT: 15 U/L (ref 0–35)
AST: 21 U/L (ref 0–37)
Albumin: 4.3 g/dL (ref 3.5–5.2)
Alkaline Phosphatase: 59 U/L (ref 39–117)
Potassium: 4.3 mEq/L (ref 3.5–5.1)
Sodium: 139 mEq/L (ref 135–145)
Total Protein: 6.8 g/dL (ref 6.0–8.3)

## 2011-10-24 LAB — CBC WITH DIFFERENTIAL/PLATELET
Basophils Absolute: 0 10*3/uL (ref 0.0–0.1)
Eosinophils Absolute: 0.4 10*3/uL (ref 0.0–0.7)
MCHC: 33.1 g/dL (ref 30.0–36.0)
MCV: 97.8 fl (ref 78.0–100.0)
Monocytes Absolute: 0.5 10*3/uL (ref 0.1–1.0)
Neutrophils Relative %: 61.2 % (ref 43.0–77.0)
Platelets: 264 10*3/uL (ref 150.0–400.0)
WBC: 6.1 10*3/uL (ref 4.5–10.5)

## 2011-10-24 LAB — LIPID PANEL
Cholesterol: 156 mg/dL (ref 0–200)
LDL Cholesterol: 78 mg/dL (ref 0–99)
VLDL: 11.4 mg/dL (ref 0.0–40.0)

## 2011-10-24 LAB — VITAMIN D 25 HYDROXY (VIT D DEFICIENCY, FRACTURES): Vit D, 25-Hydroxy: 15 ng/mL — ABNORMAL LOW (ref 30–89)

## 2011-10-25 ENCOUNTER — Other Ambulatory Visit: Payer: Self-pay

## 2011-10-25 MED ORDER — PANTOPRAZOLE SODIUM 40 MG PO TBEC: 40.0000 mg | DELAYED_RELEASE_TABLET | Freq: Every day | ORAL | Status: DC

## 2011-10-25 NOTE — Telephone Encounter (Signed)
Will refill electronically  

## 2011-10-25 NOTE — Telephone Encounter (Signed)
Patient  is requesting Protonix 40 mg. Ok to refill?

## 2011-10-28 ENCOUNTER — Ambulatory Visit: Payer: Medicare Other

## 2011-11-01 ENCOUNTER — Telehealth: Payer: Self-pay
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
## 2011-11-01 NOTE — Telephone Encounter (Signed)
Noted. Thanks.  Will route to PCP as fyi.

## 2011-11-04 ENCOUNTER — Encounter: Payer: Self-pay | Admitting: Family Medicine

## 2011-11-04 ENCOUNTER — Ambulatory Visit (INDEPENDENT_AMBULATORY_CARE_PROVIDER_SITE_OTHER): Payer: Medicare Other | Admitting: Family Medicine

## 2011-11-04 DIAGNOSIS — I10 Essential (primary) hypertension: Secondary | ICD-10-CM

## 2011-11-05 ENCOUNTER — Ambulatory Visit: Payer: Self-pay | Admitting: Ophthalmology

## 2011-11-06 ENCOUNTER — Ambulatory Visit (INDEPENDENT_AMBULATORY_CARE_PROVIDER_SITE_OTHER): Payer: Medicare Other | Admitting: Family Medicine

## 2011-11-06 ENCOUNTER — Encounter: Payer: Self-pay | Admitting: Family Medicine

## 2011-11-06 VITALS — BP 150/90 | HR 74 | Temp 98.1°F | Ht 64.0 in | Wt 150.8 lb

## 2011-11-06 DIAGNOSIS — S0990XA Unspecified injury of head, initial encounter: Secondary | ICD-10-CM

## 2011-11-06 DIAGNOSIS — R634 Abnormal weight loss: Secondary | ICD-10-CM

## 2011-11-06 DIAGNOSIS — E538 Deficiency of other specified B group vitamins: Secondary | ICD-10-CM

## 2011-11-06 MED ORDER — CYANOCOBALAMIN 1000 MCG/ML IJ SOLN
1000.0000 ug | Freq: Once | INTRAMUSCULAR | Status: AC
  Administered 2011-11-06: 1000 ug via INTRAMUSCULAR

## 2011-11-06 NOTE — Patient Instructions (Addendum)
If you develop headache/ nausea/ vomiting/ confusion/ vision change or worse dizziness- please call or seek care  I'm glad you are ok  B12 shot today  Then you need 3 more B12 shots in 3 weeks (one per week for 4 weeks) Follow up with me in 6 weeks

## 2011-11-06 NOTE — Assessment & Plan Note (Signed)
Mild - 7 lb in 1.5 years- will keep up with regular meals Is anemic  Will f/u with GI at end of the month and take it from there

## 2011-11-06 NOTE — Assessment & Plan Note (Signed)
Nl reassuring exam Disc red flags to watch for - see AVS Will update

## 2011-11-06 NOTE — Assessment & Plan Note (Signed)
To start B12 shots one weekly for 4 weeks and then f/u 6 wk for visit and also lab
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
## 2011-11-10 NOTE — Progress Notes (Signed)
  Subjective:    Patient ID: Jodi Bartlett, female    DOB: 04/17/1935, 76 y.o.   MRN: [id number]  HPI Pt cancelled appt   Review of Systems     Objective:   Physical Exam        Assessment & Plan:

## 2011-11-13 ENCOUNTER — Ambulatory Visit (INDEPENDENT_AMBULATORY_CARE_PROVIDER_SITE_OTHER): Payer: Medicare Other | Admitting: Family Medicine

## 2011-11-13 ENCOUNTER — Encounter: Payer: Self-pay | Admitting: Family Medicine

## 2011-11-13 VITALS — BP 130/72 | HR 81 | Temp 98.0°F | Ht 64.0 in | Wt 150.8 lb

## 2011-11-13 DIAGNOSIS — N183 Chronic kidney disease, stage 3 unspecified: Secondary | ICD-10-CM | POA: Insufficient documentation

## 2011-11-13 DIAGNOSIS — D649 Anemia, unspecified: Secondary | ICD-10-CM

## 2011-11-13 DIAGNOSIS — E559 Vitamin D deficiency, unspecified: Secondary | ICD-10-CM

## 2011-11-13 DIAGNOSIS — E538 Deficiency of other specified B group vitamins: Secondary | ICD-10-CM

## 2011-11-13 NOTE — Progress Notes (Signed)
Subjective:    Patient ID: Jodi Bartlett, female    DOB: 1935-11-21, 76 y.o.   MRN: [id number]  HPI Here for f/u of vit B12 def and vit D def  Still no energy at all  Gives out as well  Still does a lot for her age but finds herself dragging  No cp or sob - just tired   Last check B12 level was 91 Has had 4 inj Needs level re check  Also vit D level was 15 Started vit D otc No fractures  Needs to consider a bone density test   Wt no change  Pt worried about wt loss - upcoming appt with GI  Also much stress- appetite may be down bmi of 25  Lab Results  Component Value Date   WBC 6.1 10/23/2011   HGB 10.7* 10/23/2011   HCT 32.4* 10/23/2011   MCV 97.8 10/23/2011   PLT 264.0 10/23/2011   this had improved   Has appt with Dr Marina Goodell upcoming  Has stomach and colon issues all her life Stool is lighter than usual    Patient Active Problem List  Diagnosis  . VITAMIN B12 DEFICIENCY  . UNSPECIFIED VITAMIN D DEFICIENCY  . ANXIETY  . DEPRESSION  . HYPERTENSION  . ESOPHAGEAL STRICTURE  . GERD  . DIVERTICULOSIS-COLON  . IBS  . BACK PAIN  . BUNION  . COLONIC POLYPS, HX OF  . ULCERATIVE COLITIS, HX OF  . Weight loss  . Head injury, closed   Past Medical History  Diagnosis Date  . Anxiety   . Depressed   . GERD (gastroesophageal reflux disease)   . HTN (hypertension)   . Back pain   . OA (ocular albinism)   . Dizziness     vertigo   Past Surgical History  Procedure Date  . Hysterectomy (other)     age 31; ovarian cancer (1962)  . Cholecystectomy 1994  . Right shoulder surgery   . Left knee surgery   . Incisional hernia repair     after ccy  . Carpal tunnel release-right   . Breast biopsy- benign   . Bunion/morton's neuroma 4/07    right foot   . Colonoscopy     very difficul- supp with BE past  . Colitis transfusion     hosp at 25  . Hemorrhoid surgery   . Egd tight ues sphincter 7/09    with dialtion  . Foot surgery 10/09    TMT fusion and  bunionectomy  . Ct head (other) 6/10    small vessel chronic change/ no acute findings  . Stress cardiolite 9/10   History  Substance Use Topics  . Smoking status: Never Smoker   . Smokeless tobacco: Not on file  . Alcohol Use: Yes     social wine    Family History  Problem Relation Age of Onset  . Coronary artery disease Father   . Hypertension Father   . Heart disease Father     CAD  . Hypertension Sister   . Hyperlipidemia Sister   . Stroke Mother    No Known Allergies Current Outpatient Prescriptions on File Prior to Visit  Medication Sig Dispense Refill  . ALPRAZolam (XANAX) 0.25 MG tablet Take 0.25 mg by mouth every 4 (four) hours as needed.        Marland Kitchen amitriptyline (ELAVIL) 100 MG tablet Take 1 tablet (100 mg total) by mouth at bedtime.  90 tablet  3  . bisoprolol (ZEBETA) 5  MG tablet Take 1/2 tablet by mouth daily  45 tablet  3  . buPROPion (WELLBUTRIN SR) 150 MG 12 hr tablet Take 150 mg by mouth daily. Take 2       . cholecalciferol (VITAMIN D) 1000 UNITS tablet Take 1,000 Units by mouth daily. Take 3 tabs       . clidinium-chlordiazePOXIDE (LIBRAX) 2.5-5 MG per capsule Take 1 capsule by mouth. Before meals as needed       . cyanocobalamin (,VITAMIN B-12,) 1000 MCG/ML injection Inject 1,000 mcg into the muscle once. Every 3 months.      Marland Kitchen HYDROcodone-acetaminophen (NORCO) 5-325 MG per tablet Take 1 tablet by mouth. Every 4-6 hours prn pain       . pantoprazole (PROTONIX) 40 MG tablet Take 1 tablet (40 mg total) by mouth daily.  30 tablet  11  . triamterene-hydrochlorothiazide (DYAZIDE) 37.5-25 MG per capsule Take 1 each (1 capsule total) by mouth daily.  90 capsule  3     Review of Systems Review of Systems  Constitutional: Negative for fever, appetite change, and unexpected weight chang (this visit wt is stable) Eyes: Negative for pain and visual disturbance.  Respiratory: Negative for cough and shortness of breath.   Cardiovascular: Negative for cp or palpitations     Gastrointestinal: Negative for nausea, diarrhea and constipation. neg for blood in stool, pos for abd discomfort (years) Genitourinary: Negative for urgency and frequency. neg for hematuria  Skin: Negative for pallor or rash   Neurological: Negative for weakness, light-headedness, numbness and headaches.  Hematological: Negative for adenopathy. Does not bruise/bleed easily.  Psychiatric/Behavioral: Negative for dysphoric mood. Pos for anx from many stressors       Objective:   Physical Exam  Constitutional: She appears well-developed and well-nourished. No distress.  HENT:  Head: Normocephalic and atraumatic.  Mouth/Throat: Oropharynx is clear and moist.  Eyes: Conjunctivae and EOM are normal. Pupils are equal, round, and reactive to light. Right eye exhibits no discharge. Left eye exhibits no discharge. No scleral icterus.  Neck: Normal range of motion. Neck supple. No JVD present. Carotid bruit is not present. No thyromegaly present.  Cardiovascular: Normal rate, regular rhythm, normal heart sounds and intact distal pulses.  Exam reveals no gallop.   Pulmonary/Chest: Effort normal and breath sounds normal. No respiratory distress. She has no wheezes.  Abdominal: Soft. Bowel sounds are normal. She exhibits no distension. There is no tenderness.  Musculoskeletal: She exhibits no tenderness.  Lymphadenopathy:    She has no cervical adenopathy.  Neurological: She is alert. She has normal reflexes. No cranial nerve deficit. She exhibits normal muscle tone. Coordination normal.  Skin: Skin is warm and dry. No rash noted. No erythema. No pallor.  Psychiatric: Her speech is normal and behavior is normal. Thought content normal. Her mood appears anxious. Her affect is not blunt and not labile. She does not exhibit a depressed mood.          Assessment & Plan:

## 2011-11-13 NOTE — Assessment & Plan Note (Signed)
Level today after 4 B12 shots No change in energy levels Long hx of GI issues (also anemia now for f/u with GI) Will likely need ongoing B12 shots

## 2011-11-13 NOTE — Assessment & Plan Note (Signed)
For f/u with GI soon  Hb of 10.7 and pt is fatigued Has had some surgeries Iron studies today tx B12 def also

## 2011-11-13 NOTE — Assessment & Plan Note (Signed)
Level today-taking otc D  May need to px high dose -dep on results  Disc imp to overall and bone health

## 2011-11-13 NOTE — Patient Instructions (Addendum)
Labs today for anemia and B12 deficiency and vitamin D deficiency Will make plan based on results  Follow up with GI as planned

## 2011-11-14 ENCOUNTER — Telehealth: Payer: Self-pay | Admitting: Family Medicine

## 2011-11-14 LAB — IBC PANEL
Iron: 110 ug/dL (ref 42–145)
Saturation Ratios: 27 % (ref 20.0–50.0)
Transferrin: 290.6 mg/dL (ref 212.0–360.0)

## 2011-11-14 LAB — CBC WITH DIFFERENTIAL/PLATELET
Basophils Relative: 1 % (ref 0.0–3.0)
Eosinophils Relative: 6.6 % — ABNORMAL HIGH (ref 0.0–5.0)
MCV: 96.4 fl (ref 78.0–100.0)
Monocytes Absolute: 0.4 10*3/uL (ref 0.1–1.0)
Monocytes Relative: 5.7 % (ref 3.0–12.0)
Neutrophils Relative %: 68.7 % (ref 43.0–77.0)
RBC: 3.62 Mil/uL — ABNORMAL LOW (ref 3.87–5.11)
WBC: 7.8 10*3/uL (ref 4.5–10.5)

## 2011-11-14 LAB — VITAMIN B12: Vitamin B-12: 251 pg/mL (ref 211–911)

## 2011-11-14 LAB — FERRITIN: Ferritin: 18.2 ng/mL (ref 10.0–291.0)

## 2011-11-14 MED ORDER — ERGOCALCIFEROL 1.25 MG (50000 UT) PO CAPS: 50000.0000 [IU] | ORAL_CAPSULE | ORAL | Status: DC

## 2011-11-14 NOTE — Telephone Encounter (Signed)
Rx called in, patient informed 

## 2011-11-14 NOTE — Telephone Encounter (Signed)
Vit D level is low - still pending other labs Please call in high dose weekly vit D- to take for 12 weeks on top of your regular daily vit D supplement  Re check lab for vit D level in about 12 weeks please

## 2011-11-19 ENCOUNTER — Ambulatory Visit: Payer: Self-pay | Admitting: Ophthalmology

## 2011-11-22 ENCOUNTER — Ambulatory Visit (INDEPENDENT_AMBULATORY_CARE_PROVIDER_SITE_OTHER): Payer: Medicare Other

## 2011-11-22 DIAGNOSIS — E538 Deficiency of other specified B group vitamins: Secondary | ICD-10-CM

## 2011-11-22 MED ORDER — CYANOCOBALAMIN 1000 MCG/ML IJ SOLN
1000.0000 ug | Freq: Once | INTRAMUSCULAR | Status: AC
  Administered 2011-11-22: 1000 ug via INTRAMUSCULAR

## 2011-11-25 ENCOUNTER — Ambulatory Visit: Payer: Medicare Other | Admitting: Internal Medicine

## 2011-11-26 ENCOUNTER — Ambulatory Visit: Payer: Medicare Other

## 2011-11-28 ENCOUNTER — Encounter: Payer: Self-pay | Admitting: Internal Medicine

## 2011-11-28 ENCOUNTER — Ambulatory Visit: Payer: Medicare Other

## 2011-11-28 ENCOUNTER — Ambulatory Visit (INDEPENDENT_AMBULATORY_CARE_PROVIDER_SITE_OTHER): Payer: Medicare Other | Admitting: Internal Medicine

## 2011-11-28 VITALS — BP 128/62 | HR 80 | Ht 64.0 in | Wt 149.0 lb

## 2011-11-28 DIAGNOSIS — R0789 Other chest pain: Secondary | ICD-10-CM

## 2011-11-28 DIAGNOSIS — K219 Gastro-esophageal reflux disease without esophagitis: Secondary | ICD-10-CM

## 2011-11-28 DIAGNOSIS — R1013 Epigastric pain: Secondary | ICD-10-CM

## 2011-11-28 DIAGNOSIS — K589 Irritable bowel syndrome without diarrhea: Secondary | ICD-10-CM

## 2011-11-28 DIAGNOSIS — D510 Vitamin B12 deficiency anemia due to intrinsic factor deficiency: Secondary | ICD-10-CM

## 2011-11-28 DIAGNOSIS — Z86010 Personal history of colonic polyps: Secondary | ICD-10-CM

## 2011-11-28 DIAGNOSIS — R634 Abnormal weight loss: Secondary | ICD-10-CM

## 2011-11-28 LAB — BASIC METABOLIC PANEL
CO2: 27 mEq/L (ref 19–32)
Chloride: 101 mEq/L (ref 96–112)
Glucose, Bld: 89 mg/dL (ref 70–99)
Sodium: 137 mEq/L (ref 135–145)

## 2011-11-28 NOTE — Progress Notes (Signed)
HISTORY OF PRESENT ILLNESS:  Jodi Bartlett is a 76 y.o. female with multiple medical problems and surgeries as listed below. Seen in this office for GERD, atypical CP, IBS, and adenomatous colon polyps. Presents today with a myriad of GI complaints. Accompanied by her friend, as she was at the time of her last visit 12-26-2009. Her chief complaint is "reflux" not improved with PPI and antacids. She describes chest pain with occasional burning sensation that radiates into her back. This is the same complaint that she was seen for 05-2009 and 11-2009. There is no associated pyrosis, water brash, or dysphagia. No improvement with PPI or antacids. She is s/p cholecystectomy. Last EGD 03-2009 with incidental esophageal ring, atrophic gastric mucosa, and H/H... [place]. 01-2009 was negative. This complaint has NOT been deemed GI in origin (see 12-26-09 note). Next, She complains of  Ongoing IBS symptoms - diarrhea alternating with constipation and associated with abdominal pain and bloating. Not identifying exacerbating or relieving factors. Has had problem for years. Worse past 6 months. Rarely uses Librax with ? Effect. No bleeding. Last colonoscopy 03-07-2009 w/ diminutive adenoma, avm, and diverticulosis. Normal random colon bx. New complaints include 7# weight loss (wt today 149#, 10-23-11 153#, and 12-26-09 158#). Reports occasional whitish / yellow stools, but no true steatorrhea. Apparently dx with Vit D and B12 deficiencies. Reviewed outside labs. Hg 11.7 w/ MCV 96.4 11-13-11. Normal CMET, Lipids. TSH 1.98.  REVIEW OF SYSTEMS:  All non-GI ROS negative except for anxiety, depression, arthritis, back pain, cough, fatigue, insomnia, increase thirst  Past Medical History  Diagnosis Date  . Anxiety   . Depressed   . GERD (gastroesophageal reflux disease)   . HTN (hypertension)   . Back pain   . OA (ocular albinism)   . Dizziness     vertigo  . Anemia     hx of  . Arthritis   . Ovarian cancer   .  Interstitial cystitis   . IBS (irritable bowel syndrome)   . UTI (lower urinary tract infection)     hx of    . Hiatal hernia     EGD   . Stricture and stenosis of esophagus 2009    EGD   . Hx of colonic polyps 2010    Colonoscopy  . Diverticulosis 2010    Colonoscopy    Past Surgical History  Procedure Date  . Hysterectomy (other)     age 78; ovarian cancer (1962)  . Cholecystectomy 1994  . Right shoulder surgery   . Left knee surgery   . Incisional hernia repair     after ccy  . Carpal tunnel release-right   . Breast biopsy- benign   . Bunion/morton's neuroma 4/07    right foot   . Colonoscopy     very difficul- supp with BE past  . Colitis transfusion     hosp at 25  . Hemorrhoid surgery   . Egd tight ues sphincter 7/09    with dialtion  . Foot surgery 10/09    TMT fusion and bunionectomy  . Ct head (other) 6/10    small vessel chronic change/ no acute findings  . Stress cardiolite 9/10  . Appendectomy     Social History Jodi Bartlett  reports that she has never smoked. She has never used smokeless tobacco. She reports that she drinks alcohol. She reports that she does not use illicit drugs.  family history includes Coronary artery disease in her father; Heart disease in her father;  Hyperlipidemia in her sister; Hypertension in her father and sister; and Stroke in her mother.  There is no history of Colon cancer.  No Known Allergies     PHYSICAL EXAMINATION:  Vital signs: BP 128/62  Pulse 80  Ht 5\' 4"  (1.626 m)  Wt 149 lb (67.586 kg)  BMI 25.58 kg/m2 General: Well-developed, well-nourished, no acute distress HEENT: Sclerae are anicteric, conjunctiva pink. Oral mucosa intact Lungs: Clear Heart: Regular Abdomen: soft, nontender, nondistended, no obvious ascites, no peritoneal signs, normal bowel sounds. No organomegaly. Extremities: No edema Psychiatric: alert and oriented x3. Cooperative    ASSESSMENT:  1. Atypical Chest Pain. Chronic, unchanged.  No response to PPI. Not felt to be GI 2. GERD with incidental esophageal stricture. Asymptomatic on PPI 3. Diarrhea predominant IBS. Ongoing 4. Mild wt loss 5. B12 deficiency with anemia. Suspect pernicious anemia. Atrophic gastric mucosa on last EGD 6. Colonic adenoma (small) 03-2009   PLAN:  1. Celiac sprue panel, Serum IgA level 2. Stool for qualitative fecal fat 3. Contrast CT abdomen and pelvis with fine cuts through the pancreas "abdominal pain, wt loss". Patient want to have this done in [employer]. Arrange at [facility] by my my CMA, for 12-10-11 4. Chronic B12 replacement needed (per Dr Milinda Antis) 5. Continue PPI. Lowest dose to control actual reflux symptoms (not atypical CP) 6.Return to Dr Milinda Antis for chronic non-GI CP 7. Surveillance colonosocopy  12- 2015

## 2011-11-28 NOTE — Patient Instructions (Addendum)
Your physician has requested that you go to the basement for lab work before leaving today    You have been scheduled for a CT scan of the abdomen and pelvis at [facility]. You are scheduled on 12-10-11 at 1:00pm. You should arrive 15 minutes prior to your appointment time for registration. Please follow the written instructions below on the day of your exam:  WARNING: IF YOU ARE ALLERGIC TO IODINE/X-RAY DYE, PLEASE NOTIFY RADIOLOGY IMMEDIATELY AT [phone]! YOU WILL BE GIVEN A 13 HOUR PREMEDICATION PREP.  1) Do not eat or drink anything after 8:00am (4 hours prior to your test) 2) You have been given 2 bottles of oral contrast to drink. The solution may taste better if refrigerated, but do NOT add ice or any other liquid to this solution. Shake well before drinking.    Drink 1 bottle of contrast @ 11:00am (2 hours prior to your exam)  Drink 1 bottle of contrast @ 12:00pm (1 hour prior to your exam)  You may take any medications as prescribed with a small amount of water except for the following: Metformin, Glucophage, Glucovance, Avandamet, Riomet, Fortamet, Actoplus Met, Janumet, Glumetza or Metaglip. The above medications must be held the day of the exam AND 48 hours after the exam.  The purpose of you drinking the oral contrast is to aid in the visualization of your intestinal tract. The contrast solution may cause some diarrhea. Before your exam is started, you will be given a small amount of fluid to drink. Depending on your individual set of symptoms, you may also receive an intravenous injection of x-ray contrast/dye. Plan on being at [facility] for 30 minutes or long, depending on the type of exam you are having performed.  If you have any questions regarding your exam or if you need to reschedule, you may call the CT department at [phone] between the hours of 8:00 am and 5:00 pm, Monday-Friday.  You have been given a lab order to take with you for a  BMET at [place].  Please have them fax the results after they are done  ________________________________________________________________________

## 2011-11-29 ENCOUNTER — Ambulatory Visit (INDEPENDENT_AMBULATORY_CARE_PROVIDER_SITE_OTHER): Payer: Medicare Other

## 2011-11-29 DIAGNOSIS — E538 Deficiency of other specified B group vitamins: Secondary | ICD-10-CM

## 2011-11-29 LAB — GLIA (IGA/G) + TTG IGA: Gliadin IgA: 1.9 U/mL (ref <20)

## 2011-11-29 MED ORDER — CYANOCOBALAMIN 1000 MCG/ML IJ SOLN
1000.0000 ug | Freq: Once | INTRAMUSCULAR | Status: AC
  Administered 2011-11-29: 1000 ug via INTRAMUSCULAR

## 2011-12-03 ENCOUNTER — Encounter: Payer: Self-pay | Admitting: Internal Medicine

## 2011-12-03 ENCOUNTER — Ambulatory Visit: Payer: Self-pay | Admitting: Ophthalmology

## 2011-12-06 ENCOUNTER — Ambulatory Visit: Payer: Medicare Other

## 2011-12-10 ENCOUNTER — Ambulatory Visit: Payer: Self-pay

## 2011-12-10 LAB — CREATININE, SERUM
EGFR (African American): >60
EGFR (Non-African Amer.): 59 — ABNORMAL LOW

## 2011-12-11 ENCOUNTER — Ambulatory Visit (INDEPENDENT_AMBULATORY_CARE_PROVIDER_SITE_OTHER): Payer: Medicare Other | Admitting: *Deleted

## 2011-12-11 DIAGNOSIS — E538 Deficiency of other specified B group vitamins: Secondary | ICD-10-CM

## 2011-12-11 MED ORDER — CYANOCOBALAMIN 1000 MCG/ML IJ SOLN
1000.0000 ug | Freq: Once | INTRAMUSCULAR | Status: AC
  Administered 2011-12-11: 1000 ug via INTRAMUSCULAR

## 2011-12-18 ENCOUNTER — Other Ambulatory Visit (INDEPENDENT_AMBULATORY_CARE_PROVIDER_SITE_OTHER): Payer: Medicare Other

## 2011-12-18 DIAGNOSIS — E538 Deficiency of other specified B group vitamins: Secondary | ICD-10-CM

## 2011-12-23 ENCOUNTER — Ambulatory Visit: Payer: Medicare Other | Admitting: Internal Medicine

## 2011-12-23 ENCOUNTER — Telehealth: Payer: Self-pay

## 2011-12-23 NOTE — Telephone Encounter (Signed)
Message copied by Chrystie Nose on Mon Dec 23, 2011  3:28 PM ------      Message from: Hilarie Fredrickson      Created: Mon Dec 23, 2011  9:05 AM      Regarding: CT results       Bonita Quin, the patient had a CT scan of the abdomen and pelvis completed 12/10/2011 at [facility] I have reviewed the report. They mention possible thickening of the colon, a hiatal hernia, and possible abnormality in the right lung. Regarding the abdominal findings, have her come back and see me for a routine checkup. In terms of the lung, the radiologist recommends a noncontrast CT of the chest in about 3 months to assure stability. This can be arranged with her primary provider, Dr. Milinda Antis. Please convert this to a phone note. Thank you. Dr. Marina Goodell

## 2011-12-23 NOTE — Telephone Encounter (Signed)
Pt scheduled to see Dr. Marina Goodell 01/15/12@9 :15am. Left message for pt to call back.

## 2011-12-24 NOTE — Telephone Encounter (Signed)
Spoke with pt and she is aware of CT results. Pt aware of appt date and time.

## 2012-01-08 ENCOUNTER — Telehealth: Payer: Self-pay | Admitting: *Deleted

## 2012-01-08 NOTE — Telephone Encounter (Signed)
Ok, thanks.

## 2012-01-08 NOTE — Telephone Encounter (Signed)
Called pt and asked if she had scheduled the follow 3 month CT, pt advise me that she hadn't yet but she has an appt with her doctor on 01/15/12 to discuss the CT results and she plans on making the follow up CT appt then

## 2012-01-15 ENCOUNTER — Telehealth: Payer: Self-pay | Admitting: *Deleted

## 2012-01-15 ENCOUNTER — Encounter: Payer: Self-pay | Admitting: Internal Medicine

## 2012-01-15 ENCOUNTER — Ambulatory Visit (INDEPENDENT_AMBULATORY_CARE_PROVIDER_SITE_OTHER): Payer: Medicare Other | Admitting: Internal Medicine

## 2012-01-15 VITALS — BP 144/84 | HR 88 | Ht 62.0 in | Wt 149.2 lb

## 2012-01-15 DIAGNOSIS — R933 Abnormal findings on diagnostic imaging of other parts of digestive tract: Secondary | ICD-10-CM

## 2012-01-15 DIAGNOSIS — Z86010 Personal history of colonic polyps: Secondary | ICD-10-CM

## 2012-01-15 DIAGNOSIS — R1013 Epigastric pain: Secondary | ICD-10-CM

## 2012-01-15 DIAGNOSIS — K219 Gastro-esophageal reflux disease without esophagitis: Secondary | ICD-10-CM

## 2012-01-15 MED ORDER — MOVIPREP 100 G PO SOLR: 1.0000 | Freq: Once | ORAL | Status: DC

## 2012-01-15 NOTE — Telephone Encounter (Signed)
That sounds fine

## 2012-01-15 NOTE — Telephone Encounter (Signed)
Pt set up a f/u appt for 01/21/12 to discuss results of CT, but pt talked to Dr. Marina Goodell today and he said that the 3 month f/u CT has to be ordered by you, pt said will discuss and f/u appt

## 2012-01-15 NOTE — Patient Instructions (Addendum)
You have been scheduled for an endoscopy and colonoscopy with propofol. Please follow the written instructions given to you at your visit today. Please pick up your prep at the pharmacy within the next 1-3 days. If you use inhalers (even only as needed), please bring them with you on the day of your procedure.    

## 2012-01-15 NOTE — Progress Notes (Signed)
HISTORY OF PRESENT ILLNESS:  Jodi Bartlett is a 76 y.o. female with multiple medical problems and surgeries as listed below. She has been seen in this office for GERD, atypical chest pain not felt to be GI in origin, IBS, and adenomatous colon polyps. She was last evaluated in the office 11/28/2011 with a myriad of GI complaints. See that dictation for details. The clinical impression at that time was chronic unchanged atypical chest pain not felt to be GI in origin; GERD, well controlled with PPI, with incidental asymptomatic esophageal stricture on prior endoscopy; diarrhea predominant IBS ongoing; probable pernicious anemia; mild unexplained weight loss; and colonic adenomas (most recent colonoscopy 2010). We did test for celiac disease. This was negative. Fecal fat requested but not completed. The contrast CT scan of the abdomen and pelvis with fine cuts of the pancreas was performed ([place] per her request) and raised the question of abnormal thickening of the transverse colon as well as a moderate-sized hiatal hernia and  indeterminant opacities in the right lung (which is being followed by her PCP). She presents today for followup. Her GI complaints are unchanged. No additional weight loss. No new complaints or issues.  REVIEW OF SYSTEMS:  All non-GI ROS negative except for arthritis, anxiety, back pain, cough, fatigue, insomnia  Past Medical History  Diagnosis Date  . Anxiety   . Depressed   . GERD (gastroesophageal reflux disease)   . HTN (hypertension)   . Back pain   . OA (ocular albinism)   . Dizziness     vertigo  . Anemia     hx of  . Arthritis   . Ovarian cancer   . Interstitial cystitis   . IBS (irritable bowel syndrome)   . UTI (lower urinary tract infection)     hx of    . Hiatal hernia     EGD   . Stricture and stenosis of esophagus 2009    EGD   . Hx of colonic polyps 2010    Colonoscopy  . Diverticulosis 2010    Colonoscopy    Past Surgical History    Procedure Date  . Hysterectomy (other)     age 86; ovarian cancer (1962)  . Cholecystectomy 1994  . Right shoulder surgery   . Left knee surgery   . Incisional hernia repair     after ccy  . Carpal tunnel release-right   . Breast biopsy- benign   . Bunion/morton's neuroma 4/07    right foot   . Colonoscopy     very difficul- supp with BE past  . Colitis transfusion     hosp at 25  . Hemorrhoid surgery   . Egd tight ues sphincter 7/09    with dialtion  . Foot surgery 10/09    TMT fusion and bunionectomy  . Ct head (other) 6/10    small vessel chronic change/ no acute findings  . Stress cardiolite 9/10  . Appendectomy     Social History Jodi Bartlett  reports that she has never smoked. She has never used smokeless tobacco. She reports that she drinks alcohol. She reports that she does not use illicit drugs.  family history includes Coronary artery disease in her father; Heart disease in her father; Hyperlipidemia in her sister; Hypertension in her father and sister; and Stroke in her mother.  There is no history of Colon cancer.  No Known Allergies     PHYSICAL EXAMINATION: Vital signs: BP 144/84  Pulse 88  Ht 5'  2" (1.575 m)  Wt 149 lb 4 oz (67.699 kg)  BMI 27.30 kg/m2 General: Well-developed, well-nourished, no acute distress HEENT: Sclerae are anicteric, conjunctiva pink. Oral mucosa intact Lungs: Clear Heart: Regular Abdomen: soft, nontender, nondistended, no obvious ascites, no peritoneal signs, normal bowel sounds. No organomegaly. Extremities: No edema Psychiatric: alert and oriented x3. Cooperative      ASSESSMENT:  #1. Question of abnormality of the transverse colon on CT imaging. #2. History of adenomatous colon polyps. Last examination December 2010 #3. History of GERD with incidental esophageal stricture #4. Moderate hiatal hernia #5. Chronic chest pain felt to be non-GI #6. Diarrhea predominant IBS #7. Indeterminant pulmonary lesions being  followed by PCP   PLAN:  #1. Colonoscopy to evaluate abnormality on CT imaging as well as provide neoplasia surveillance.The nature of the procedure, as well as the risks, benefits, and alternatives were carefully and thoroughly reviewed with the patient. Ample time for discussion and questions allowed. The patient understood, was satisfied, and agreed to proceed.  #2. Movi prep prescribed. The patient instructed on its use #3. Upper endoscopy to evaluate abdominal complaints, weight loss, and hiatal hernia #4. Continue PPI #5. Antispasmodics to be considered post procedure for problems with abdominal and chest pain #6. Ongoing followup with PCP regarding cough and CT chest imaging surveillance

## 2012-01-21 ENCOUNTER — Encounter: Payer: Self-pay | Admitting: Family Medicine

## 2012-01-21 ENCOUNTER — Ambulatory Visit: Payer: Medicare Other

## 2012-01-21 ENCOUNTER — Ambulatory Visit (INDEPENDENT_AMBULATORY_CARE_PROVIDER_SITE_OTHER): Payer: Medicare Other | Admitting: Family Medicine

## 2012-01-21 VITALS — BP 140/80 | HR 102 | Temp 97.8°F | Ht 62.0 in

## 2012-01-21 DIAGNOSIS — E538 Deficiency of other specified B group vitamins: Secondary | ICD-10-CM

## 2012-01-21 DIAGNOSIS — R918 Other nonspecific abnormal finding of lung field: Secondary | ICD-10-CM | POA: Insufficient documentation

## 2012-01-21 DIAGNOSIS — Z23 Encounter for immunization: Secondary | ICD-10-CM

## 2012-01-21 DIAGNOSIS — J984 Other disorders of lung: Secondary | ICD-10-CM

## 2012-01-21 HISTORY — DX: Other nonspecific abnormal finding of lung field: R91.8

## 2012-01-21 MED ORDER — CYANOCOBALAMIN 1000 MCG/ML IJ SOLN
1000.0000 ug | Freq: Once | INTRAMUSCULAR | Status: AC
  Administered 2012-01-21: 1000 ug via INTRAMUSCULAR

## 2012-01-21 NOTE — Patient Instructions (Addendum)
We will refer for the CT chest at check out - planned for December bp was better on 2nd check Follow up for your endoscopies as planned  B12 shot today

## 2012-01-21 NOTE — Assessment & Plan Note (Signed)
RML opacity on CT incidental  Pt has had cough from her GERD Will re check CT no contrast as planned at 3 mo (december)-that was ordered Her fatigue continues

## 2012-01-21 NOTE — Assessment & Plan Note (Signed)
Shot today Continue monthly Unfortunately still fatigued- for EGD/ colonosc soon

## 2012-01-21 NOTE — Progress Notes (Signed)
Subjective:    Patient ID: Jodi Bartlett, female    DOB: 1935-10-21, 76 y.o.   MRN: [id number]  HPI Here for f/u   Has been seeing GI  Had a CT and will be doing colonoscopy next  Had some thickening in transverse colon and HH  Still has no energy  Gives out easily  No more anxious than usual  Is a hyper person that stays stressed   Has to do things and sit down and rest   Needs her B12 shot Lab Results  Component Value Date   VITAMINB12 479 12/18/2011      Lab Results  Component Value Date   WBC 7.8 11/13/2011   HGB 11.7* 11/13/2011   HCT 34.9* 11/13/2011   MCV 96.4 11/13/2011   PLT 297.0 11/13/2011     bp is high today No cp or palpitations or headaches or edema  No side effects to medicines  BP Readings from Last 3 Encounters:  01/21/12 170/102  01/15/12 144/84  11/28/11 128/62    Checked again - was 140/80- much better after sitting   Had CT of abd and pelvis from GI and it incidentally saw redial RML mild opacities consistent with atelectasis or scarrring  and f/u 3 mo with CT non contrast chest was recommended  No lung problems in the past  Has been coughing from her reflux   No fever  No productive cough Never smoked  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 VITAMIN B12 DEFICIENCY  . UNSPECIFIED VITAMIN D DEFICIENCY  . ANXIETY  . DEPRESSION  . HYPERTENSION  . ESOPHAGEAL STRICTURE  . GERD  . DIVERTICULOSIS-COLON  . IBS  . BACK PAIN  . BUNION  . COLONIC POLYPS, HX OF  . ULCERATIVE COLITIS, HX OF  . Weight loss  . Head injury, closed  . Anemia  . Lung abnormality   Past Medical History  Diagnosis Date  . Anxiety   . Depressed   . GERD (gastroesophageal reflux disease)   . HTN (hypertension)   . Back pain   . OA (ocular albinism)   . Dizziness     vertigo  . Anemia     hx of  . Arthritis   .  Ovarian cancer   . Interstitial cystitis   . IBS (irritable bowel syndrome)   . UTI (lower urinary tract infection)     hx of    . Hiatal hernia     EGD   . Stricture and stenosis of esophagus 2009    EGD   . Hx of colonic polyps 2010    Colonoscopy  . Diverticulosis 2010    Colonoscopy   Past Surgical History  Procedure Date  . Hysterectomy (other)     age 42; ovarian cancer (1962)  . Cholecystectomy 1994  . Right shoulder surgery   . Left knee surgery   . Incisional hernia repair     after ccy  . Carpal tunnel release-right   . Breast biopsy- benign   . Bunion/morton's neuroma 4/07    right foot   . Colonoscopy     very difficul- supp with BE past  . Colitis transfusion     hosp at 25  . Hemorrhoid surgery   . Egd tight ues sphincter 7/09    with  dialtion  . Foot surgery 10/09    TMT fusion and bunionectomy  . Ct head (other) 6/10    small vessel chronic change/ no acute findings  . Stress cardiolite 9/10  . Appendectomy    History  Substance Use Topics  . Smoking status: Never Smoker   . Smokeless tobacco: Never Used  . Alcohol Use: Yes     social wine    Family History  Problem Relation Age of Onset  . Coronary artery disease Father   . Hypertension Father   . Heart disease Father     CAD  . Hypertension Sister   . Hyperlipidemia Sister   . Stroke Mother   . Colon cancer Neg Hx    No Known Allergies Current Outpatient Prescriptions on File Prior to Visit  Medication Sig Dispense Refill  . ALPRAZolam (XANAX) 0.25 MG tablet Take 0.25 mg by mouth every 4 (four) hours as needed.        Marland Kitchen amitriptyline (ELAVIL) 100 MG tablet Take 1 tablet (100 mg total) by mouth at bedtime.  90 tablet  3  . bisoprolol (ZEBETA) 5 MG tablet Take 1/2 tablet by mouth daily  45 tablet  3  . buPROPion (WELLBUTRIN SR) 150 MG 12 hr tablet Take 150 mg by mouth daily. Take 2       . cholecalciferol (VITAMIN D) 1000 UNITS tablet Take 1,000 Units by mouth 2 (two) times daily.        . cyanocobalamin (,VITAMIN B-12,) 1000 MCG/ML injection Inject 1,000 mcg into the muscle every 30 (thirty) days.       Marland Kitchen MOVIPREP 100 G SOLR Take 1 kit (100 g total) by mouth once.  1 kit  0  . pantoprazole (PROTONIX) 40 MG tablet Take 1 tablet (40 mg total) by mouth daily.  30 tablet  11  . triamterene-hydrochlorothiazide (DYAZIDE) 37.5-25 MG per capsule Take 1 each (1 capsule total) by mouth daily.  90 capsule  3     Review of Systems Review of Systems  Constitutional: Negative for fever, appetite change,  and unexpected weight change. pos for continued fatigue Eyes: Negative for pain and visual disturbance.  Respiratory: Negative for cough and shortness of breath.  pos for throat clearing  Cardiovascular: Negative for cp or palpitations    Gastrointestinal: pos for intermittent heartburn/ dysphagia / stool changes, neg for blood in stool or dark stool  Genitourinary: Negative for urgency and frequency.  Skin: Negative for pallor or rash   Neurological: Negative for weakness, light-headedness, numbness and headaches.  Hematological: Negative for adenopathy. Does not bruise/bleed easily.  Psychiatric/Behavioral: Negative for dysphoric mood. The patient is not nervous/anxious.         Objective:   Physical Exam  Constitutional: She appears well-developed and well-nourished. No distress.  HENT:  Head: Normocephalic and atraumatic.  Mouth/Throat: Oropharynx is clear and moist. No oropharyngeal exudate.  Eyes: Conjunctivae normal and EOM are normal. Pupils are equal, round, and reactive to light. Right eye exhibits no discharge. Left eye exhibits no discharge. No scleral icterus.  Neck: Normal range of motion. Neck supple. No JVD present. Carotid bruit is not present. No thyromegaly present.  Cardiovascular: Normal rate, regular rhythm, normal heart sounds and intact distal pulses.  Exam reveals no gallop.   Pulmonary/Chest: Effort normal and breath sounds normal. No respiratory  distress. She has no wheezes.  Abdominal: Soft. Bowel sounds are normal. She exhibits no distension, no abdominal bruit and no mass.  Musculoskeletal: She exhibits no  edema.  Lymphadenopathy:    She has no cervical adenopathy.  Neurological: She is alert. She has normal reflexes. No cranial nerve deficit. She exhibits normal muscle tone. Coordination normal.  Skin: Skin is warm and dry. No rash noted. No erythema. No pallor.  Psychiatric: She has a normal mood and affect.          Assessment & Plan:

## 2012-01-28 ENCOUNTER — Ambulatory Visit (AMBULATORY_SURGERY_CENTER): Payer: Medicare Other | Admitting: Internal Medicine

## 2012-01-28 ENCOUNTER — Encounter: Payer: Self-pay | Admitting: Internal Medicine

## 2012-01-28 VITALS — BP 142/93 | HR 72 | Temp 98.4°F | Resp 20 | Ht 62.0 in | Wt 149.0 lb

## 2012-01-28 DIAGNOSIS — K222 Esophageal obstruction: Secondary | ICD-10-CM

## 2012-01-28 DIAGNOSIS — R1013 Epigastric pain: Secondary | ICD-10-CM

## 2012-01-28 DIAGNOSIS — K219 Gastro-esophageal reflux disease without esophagitis: Secondary | ICD-10-CM

## 2012-01-28 DIAGNOSIS — Z1211 Encounter for screening for malignant neoplasm of colon: Secondary | ICD-10-CM

## 2012-01-28 DIAGNOSIS — D126 Benign neoplasm of colon, unspecified: Secondary | ICD-10-CM

## 2012-01-28 DIAGNOSIS — K5730 Diverticulosis of large intestine without perforation or abscess without bleeding: Secondary | ICD-10-CM

## 2012-01-28 DIAGNOSIS — R933 Abnormal findings on diagnostic imaging of other parts of digestive tract: Secondary | ICD-10-CM

## 2012-01-28 DIAGNOSIS — Z86010 Personal history of colonic polyps: Secondary | ICD-10-CM

## 2012-01-28 MED ORDER — SODIUM CHLORIDE 0.9 % IV SOLN: 500.0000 mL | INTRAVENOUS | Status: DC

## 2012-01-28 MED ORDER — HYOSCYAMINE SULFATE 0.125 MG SL SUBL: 0.1250 mg | SUBLINGUAL_TABLET | SUBLINGUAL | Status: DC | PRN

## 2012-01-28 NOTE — Op Note (Signed)
[facility] [address] [place], [address]   ENDOSCOPY PROCEDURE REPORT  PATIENT: Jodi, Bartlett  MR#: [id number] BIRTHDATE: 03/14/1936 , 76  yrs. old GENDER: Female ENDOSCOPIST: Roxy Cedar, MD REFERRED BY:  .  Self / Office, PROCEDURE DATE:  01/28/2012 PROCEDURE:  EGD, diagnostic ASA CLASS:     Class II INDICATIONS:  chest pain. MEDICATIONS: MAC sedation, administered by CRNA and propofol (Diprivan) 80mg  IV TOPICAL ANESTHETIC: Cetacaine Spray  DESCRIPTION OF PROCEDURE: After the risks benefits and alternatives of the procedure were thoroughly explained, informed consent was obtained.  The LB [id number] endoscope was introduced through the mouth and advanced to the second portion of the duodenum. Without limitations.  The instrument was slowly withdrawn as the mucosa was fully examined.    EXAM: The proximal and mid esophagus appeared normal.  The distal esophagus revealed a benign large caliber ring like stricture.  No active esophagitis.  The stomach revealed diffusely atrophic mucosa , but was otherwise normal. The duodenal bulb and post bulbar duodenum were normal.  Retroflexed views revealed a moderate hiatal hernia.     The scope was then withdrawn from the patient and the procedure completed.  COMPLICATIONS: There were no complications. ENDOSCOPIC IMPRESSION: 1. Benign large caliber ring like stricture. 2. Atrophic gastric mucosa 3. Otherwise normal exam  RECOMMENDATIONS: 1.  PRESCRIBE LEVSIN 0.125MG ; # 60; 1-2 SL Q 4-6 HOURS PRN PAIN 2.  OP follow-up in 4-6 weeks.  REPEAT EXAM:  eSigned:  Roxy Cedar, MD 01/28/2012 3:54 PM   NG:EXBMW Fransisca Connors, MD and The Patient

## 2012-01-28 NOTE — Progress Notes (Signed)
[facility] PRESCRIPTION SENT ELECTRONICALLY AND FOLLOW UP CALL TO VERIFY RECEIPT., SPOKE WITH NANCY.

## 2012-01-28 NOTE — Progress Notes (Signed)
Patient did not experience any of the following events: a burn prior to discharge; a fall within the facility; wrong site/side/patient/procedure/implant event; or a hospital transfer or hospital admission upon discharge from the facility. (G8907) Patient did not have preoperative order for IV antibiotic SSI prophylaxis. (G8918)  

## 2012-01-28 NOTE — Op Note (Signed)
[facility] [address] [place], [address]   COLONOSCOPY PROCEDURE REPORT  PATIENT: Jodi Bartlett, Jodi Bartlett  MR#: [id number] BIRTHDATE: [birth year] , 76  yrs. old GENDER: Female ENDOSCOPIST: Roxy Cedar, MD REFERRED BY:.  Self / Office PROCEDURE DATE:  01/28/2012 PROCEDURE:   Colonoscopy with biopsies ASA CLASS:   Class II INDICATIONS:patient's personal history of adenomatous colon polyps and chronic diarrhea, abdominal discomfort, wt loss. MEDICATIONS: MAC sedation, administered by CRNA and propofol (Diprivan) 340mg  IV  DESCRIPTION OF PROCEDURE:   After the risks benefits and alternatives of the procedure were thoroughly explained, informed consent was obtained.  A digital rectal exam revealed no abnormalities of the rectum.   The LB CF-[id number]  endoscope was introduced through the anus and advanced to the cecum, which was identified by both the appendix and ileocecal valve. No adverse events experienced.   The quality of the prep was excellent, using MoviPrep  The instrument was then slowly withdrawn as the colon was fully examined.      COLON FINDINGS: The mucosa appeared normal in the terminal ileum. Severe diverticulosis was noted in the sigmoid colon.   The colon mucosa was otherwise normal. Random colon bx taken to r/o microscopic colitis.  Retroflexed views revealed no abnormalities. The time to cecum=10 minutes 36 seconds.  Withdrawal time=12 minutes 58 seconds.  The scope was withdrawn and the procedure completed. COMPLICATIONS: There were no complications.  ENDOSCOPIC IMPRESSION: 1.   Normal mucosa in the terminal ileum 2.   Severe diverticulosis was noted in the sigmoid colon 3.   The colon mucosa was otherwise normal s/p biopsies 4.   NO POLYPS SEEN  RECOMMENDATIONS: 1.  Await biopsy results 2.  Upper endoscopy today (SEE REPORT) 3.  No additional routine surviellance recommended   eSigned:  Roxy Cedar, MD 01/28/2012 3:43  PM cc: Judy Pimple, MD and The Patient   PATIENT NAME:  [name], [name] MR#: [id number]

## 2012-01-28 NOTE — Patient Instructions (Addendum)
Call for an appointment with Dr. Marina Goodell in 4-6 weeks.   YOU HAD AN ENDOSCOPIC PROCEDURE TODAY AT THE [facility]: Refer to the procedure report that was given to you for any specific questions about what was found during the examination.  If the procedure report does not answer your questions, please call your gastroenterologist to clarify.  If you requested that your care partner not be given the details of your procedure findings, then the procedure report has been included in a sealed envelope for you to review at your convenience later.  YOU SHOULD EXPECT: Some feelings of bloating in the abdomen. Passage of more gas than usual.  Walking can help get rid of the air that was put into your GI tract during the procedure and reduce the bloating. If you had a lower endoscopy (such as a colonoscopy or flexible sigmoidoscopy) you may notice spotting of blood in your stool or on the toilet paper. If you underwent a bowel prep for your procedure, then you may not have a normal bowel movement for a few days.  DIET: Your first meal following the procedure should be a light meal and then it is ok to progress to your normal diet.  A half-sandwich or bowl of soup is an example of a good first meal.  Heavy or fried foods are harder to digest and may make you feel nauseous or bloated.  Likewise meals heavy in dairy and vegetables can cause extra gas to form and this can also increase the bloating.  Drink plenty of fluids but you should avoid alcoholic beverages for 24 hours.  ACTIVITY: Your care partner should take you home directly after the procedure.  You should plan to take it easy, moving slowly for the rest of the day.  You can resume normal activity the day after the procedure however you should NOT DRIVE or use heavy machinery for 24 hours (because of the sedation medicines used during the test).    SYMPTOMS TO REPORT IMMEDIATELY: A gastroenterologist can be reached at any hour.  During normal  business hours, 8:30 AM to 5:00 PM Monday through Friday, call [phone].  After hours and on weekends, please call the GI answering service at [phone] who will take a message and have the physician on call contact you.   Following lower endoscopy (colonoscopy or flexible sigmoidoscopy):  Excessive amounts of blood in the stool  Significant tenderness or worsening of abdominal pains  Swelling of the abdomen that is new, acute  Fever of 100F or higher  Following upper endoscopy (EGD)  Vomiting of blood or coffee ground material  New chest pain or pain under the shoulder blades  Painful or persistently difficult swallowing  New shortness of breath  Fever of 100F or higher  Black, tarry-looking stools  FOLLOW UP: If any biopsies were taken you will be contacted by phone or by letter within the next 1-3 weeks.  Call your gastroenterologist if you have not heard about the biopsies in 3 weeks.  Our staff will call the home number listed on your records the next business day following your procedure to check on you and address any questions or concerns that you may have at that time regarding the information given to you following your procedure. This is a courtesy call and so if there is no answer at the home number and we have not heard from you through the emergency physician on call, we will assume that you have returned to  your regular daily activities without incident.  SIGNATURES/CONFIDENTIALITY: You and/or your care partner have signed paperwork which will be entered into your electronic medical record.  These signatures attest to the fact that that the information above on your After Visit Summary has been reviewed and is understood.  Full responsibility of the confidentiality of this discharge information lies with you and/or your care-partner.

## 2012-01-29 ENCOUNTER — Telehealth: Payer: Self-pay | Admitting: *Deleted

## 2012-01-29 NOTE — Telephone Encounter (Signed)
  Follow up Call-  Call back number 01/28/2012  Post procedure Call Back phone  # [phone] hm  Permission to leave phone message Yes     Patient questions:  Do you have a fever, pain , or abdominal swelling? no Pain Score  0 *  Have you tolerated food without any problems? yes  Have you been able to return to your normal activities? yes  Do you have any questions about your discharge instructions: Diet   no Medications  no Follow up visit  no  Do you have questions or concerns about your Care? no  Actions: * If pain score is 4 or above: No action needed, pain <4.  Pt states, "I had a rough night.  I coughed and coughed.  I just don't feel good."  She denies pain or fever and was able to tolerate food.  Writer told her to take her time getting back to her normal activities and to call office if needed. Understanding voiced

## 2012-02-04 ENCOUNTER — Encounter: Payer: Self-pay | Admitting: Internal Medicine

## 2012-02-25 ENCOUNTER — Ambulatory Visit: Payer: Medicare Other

## 2012-03-02 ENCOUNTER — Encounter: Payer: Self-pay | Admitting: Family Medicine

## 2012-03-02 ENCOUNTER — Ambulatory Visit (INDEPENDENT_AMBULATORY_CARE_PROVIDER_SITE_OTHER)
Admission: RE | Admit: 2012-03-02 | Discharge: 2012-03-02 | Disposition: A | Discharge status: Home / Self Care | Payer: Medicare Other | Source: Ambulatory Visit | Attending: Family Medicine | Admitting: Family Medicine

## 2012-03-02 ENCOUNTER — Ambulatory Visit (INDEPENDENT_AMBULATORY_CARE_PROVIDER_SITE_OTHER): Payer: Medicare Other | Admitting: Family Medicine

## 2012-03-02 VITALS — BP 104/56 | HR 87 | Temp 97.9°F | Ht 62.0 in | Wt 142.8 lb

## 2012-03-02 DIAGNOSIS — K219 Gastro-esophageal reflux disease without esophagitis: Secondary | ICD-10-CM

## 2012-03-02 DIAGNOSIS — J984 Other disorders of lung: Secondary | ICD-10-CM

## 2012-03-02 DIAGNOSIS — M79609 Pain in unspecified limb: Secondary | ICD-10-CM

## 2012-03-02 DIAGNOSIS — M79641 Pain in right hand: Secondary | ICD-10-CM

## 2012-03-02 DIAGNOSIS — E538 Deficiency of other specified B group vitamins: Secondary | ICD-10-CM

## 2012-03-02 DIAGNOSIS — J479 Bronchiectasis, uncomplicated: Secondary | ICD-10-CM | POA: Insufficient documentation

## 2012-03-02 DIAGNOSIS — N644 Mastodynia: Secondary | ICD-10-CM

## 2012-03-02 DIAGNOSIS — R05 Cough: Secondary | ICD-10-CM

## 2012-03-02 DIAGNOSIS — R634 Abnormal weight loss: Secondary | ICD-10-CM

## 2012-03-02 MED ORDER — CYANOCOBALAMIN 1000 MCG/ML IJ SOLN
1000.0000 ug | Freq: Once | INTRAMUSCULAR | Status: AC
  Administered 2012-03-02: 1000 ug via INTRAMUSCULAR

## 2012-03-02 NOTE — Patient Instructions (Addendum)
I think it is a good idea to keep your GI appt tomorrow for multiple symptoms Call back if you want to go ahead with the mammogram xrays now  Use an ace bandage on hand and wrist -not too tight- for support and continue cold compresses We will do pulmonary consult at check out

## 2012-03-02 NOTE — Progress Notes (Signed)
Subjective:    Patient ID: Jodi Bartlett, female    DOB: 1936-01-15, 76 y.o.   MRN: [id number]  HPI Is coughing all day and night , also heartburn and also a hand problem Saw GI  Had stricture and this was dilated - that helped the choking kind of cough  Since then has had a persistent cough that is different  occ a thick sputum green - but not every time  Exactly the same night and day   Is still having heartburn/ acid reflux symptoms  Given levsin-no improvement  (for chest pain that goes through to her back) She has a hiatal hernia  She has not let her GI doctor know she is still having acid reflux  Currently on daily protonix -does not think that is helping  She sees Dr Marina Goodell - has a follow up appt this week   Has never seen a lung doctor  Has atelectasis on CT- has re check 10th of dec  Not wheezing or sob  Does have soreness in her chest when she breathes at times  Still does not have any energy  Lost weight again  Appetite is ok -- eating is just the same   Right hand hurts  She caught herself on it when she was getting up from a squat on Friday  Started swelling the next day  Not bruised at all  Hurts in her hand and also her wrist  Is putting cold packs on it  Took ibuprofen once yesterday No tylenol   Hurts worse upon extending all fingers   Patient Active Problem List  Diagnosis  . VITAMIN B12 DEFICIENCY  . UNSPECIFIED VITAMIN D DEFICIENCY  . ANXIETY  . DEPRESSION  . HYPERTENSION  . ESOPHAGEAL STRICTURE  . GERD  . DIVERTICULOSIS-COLON  . IBS  . BACK PAIN  . BUNION  . COLONIC POLYPS, HX OF  . ULCERATIVE COLITIS, HX OF  . Weight loss  . Head injury, closed  . Anemia  . Lung abnormality  . Cough  . Breast pain, right  . Hand pain, right   Past Medical History  Diagnosis Date  . Anxiety   . Depressed   . GERD (gastroesophageal reflux disease)   . HTN (hypertension)   . Back pain   . OA (ocular albinism)   . Dizziness     vertigo  .  Anemia     hx of  . Arthritis   . Interstitial cystitis   . IBS (irritable bowel syndrome)   . UTI (lower urinary tract infection)     hx of    . Stricture and stenosis of esophagus 2009    EGD   . Hx of colonic polyps 2010    Colonoscopy  . Diverticulosis 2010    Colonoscopy  . Ovarian cancer   . Hiatal hernia     EGD   . Allergy   . Blood transfusion without reported diagnosis    Past Surgical History  Procedure Date  . Hysterectomy (other)     age 63; ovarian cancer (1962)  . Cholecystectomy 1994  . Right shoulder surgery   . Left knee surgery   . Incisional hernia repair     after ccy  . Carpal tunnel release-right   . Breast biopsy- benign   . Bunion/morton's neuroma 4/07    right foot   . Colonoscopy     very difficul- supp with BE past  . Colitis transfusion     hosp at  25  . Hemorrhoid surgery   . Egd tight ues sphincter 7/09    with dialtion  . Foot surgery 10/09    TMT fusion and bunionectomy  . Ct head (other) 6/10    small vessel chronic change/ no acute findings  . Stress cardiolite 9/10  . Appendectomy   . Left shoulder replacement    History  Substance Use Topics  . Smoking status: Never Smoker   . Smokeless tobacco: Never Used  . Alcohol Use: Yes     Comment: social wine    Family History  Problem Relation Age of Onset  . Coronary artery disease Father   . Hypertension Father   . Heart disease Father     CAD  . Hypertension Sister   . Hyperlipidemia Sister   . Stroke Mother   . Colon cancer Neg Hx   . Esophageal cancer Neg Hx   . Rectal cancer Neg Hx   . Stomach cancer Neg Hx    No Known Allergies Current Outpatient Prescriptions on File Prior to Visit  Medication Sig Dispense Refill  . ALPRAZolam (XANAX) 0.25 MG tablet Take 0.25 mg by mouth every 4 (four) hours as needed.        Marland Kitchen amitriptyline (ELAVIL) 100 MG tablet Take 1 tablet (100 mg total) by mouth at bedtime.  90 tablet  3  . bisoprolol (ZEBETA) 5 MG tablet Take 1/2  tablet by mouth daily  45 tablet  3  . buPROPion (WELLBUTRIN SR) 150 MG 12 hr tablet Take 150 mg by mouth daily. Take 2       . cholecalciferol (VITAMIN D) 1000 UNITS tablet Take 1,000 Units by mouth 2 (two) times daily.       . cyanocobalamin (,VITAMIN B-12,) 1000 MCG/ML injection Inject 1,000 mcg into the muscle every 30 (thirty) days.       . hyoscyamine (LEVSIN SL) 0.125 MG SL tablet Place 1 tablet (0.125 mg total) under the tongue every 4 (four) hours as needed for cramping.  30 tablet  0  . naproxen sodium (ANAPROX) 220 MG tablet Take 220 mg by mouth as needed.      . pantoprazole (PROTONIX) 40 MG tablet Take 1 tablet (40 mg total) by mouth daily.  30 tablet  11  . triamterene-hydrochlorothiazide (DYAZIDE) 37.5-25 MG per capsule Take 1 each (1 capsule total) by mouth daily.  90 capsule  3        Review of Systems Review of Systems  Constitutional: Negative for fever, appetite change,  and pos for weight change.  Eyes: Negative for pain and visual disturbance.  ENt neg for dysphagia  Respiratory: Negative for wheeze  and shortness of breath.  pos for cough  Cardiovascular: Negative for cp or palpitations    Gastrointestinal: Negative for nausea, diarrhea and constipation. pos for frequent heartburn  Genitourinary: Negative for urgency and frequency.  Skin: Negative for pallor or rash   Neurological: Negative for weakness, light-headedness, numbness and headaches.  Hematological: Negative for adenopathy. Does not bruise/bleed easily.  Psychiatric/Behavioral: Negative for dysphoric mood. The patient is anxious         Objective:   Physical Exam  Constitutional: She appears well-developed and well-nourished. No distress.  HENT:  Head: Normocephalic and atraumatic.  Mouth/Throat: Oropharynx is clear and moist.  Eyes: Conjunctivae normal and EOM are normal. Pupils are equal, round, and reactive to light. Right eye exhibits no discharge. Left eye exhibits no discharge. No scleral  icterus.  Neck: Normal range  of motion. Neck supple. No JVD present. Carotid bruit is not present. No thyromegaly present.  Cardiovascular: Normal rate, regular rhythm, normal heart sounds and intact distal pulses.  Exam reveals no gallop.   Pulmonary/Chest: Effort normal and breath sounds normal. No respiratory distress. She has no wheezes. She has no rales.       No crackles   Abdominal: Soft. Bowel sounds are normal. She exhibits no distension, no abdominal bruit and no mass. There is tenderness. There is no rebound and no guarding.       Mild epigastric tenderness No guarding or rebound Neg murphy sign  Genitourinary: There is breast tenderness. No breast swelling, discharge or bleeding.       Breast exam: No mass, nodules, thickening, bulging, retraction, inflamation, nipple discharge or skin changes noted.  No axillary or clavicular LA.  Mild tenderness laterally L breast   Chaperoned exam.    Musculoskeletal: She exhibits edema and tenderness.       R hand - tenderness and swelling/ slt ecchymosis over dorsal hand proximal to 2nd and 3rd phalynx up to the wrist  Nl perf and sensation Nl rom  No tenderness in snuffbox area   Lymphadenopathy:    She has no cervical adenopathy.  Neurological: She is alert. She has normal reflexes. No cranial nerve deficit. She exhibits normal muscle tone. Coordination normal.  Skin: Skin is warm and dry. No rash noted. No erythema. No pallor.  Psychiatric: Thought content normal. Her mood appears anxious. Her speech is tangential.       Somewhat anxious today          Assessment & Plan:

## 2012-03-03 ENCOUNTER — Ambulatory Visit: Payer: Medicare Other | Admitting: Internal Medicine

## 2012-03-05 NOTE — Assessment & Plan Note (Signed)
No improvement on protonix re: heartburn symptoms  Pt is no longer having dysphagia , however  Has multiple GI c/o Urged her to keep her f/u with Dr Marina Goodell

## 2012-03-05 NOTE — Assessment & Plan Note (Signed)
B12 shot today. 

## 2012-03-05 NOTE — Assessment & Plan Note (Signed)
Unexplained - but pt does have persistent heartburn  For CT re check of lungs on the 10th  Ref done to pulmonary for visit after that  Urged to update if fever /sob or other symptoms

## 2012-03-05 NOTE — Assessment & Plan Note (Signed)
Nl exam today  Pt declines mammo or other screening  Urged her to re consider if symptoms persist - she will let me know At this time states she would not tx breast cancer if she had it

## 2012-03-05 NOTE — Assessment & Plan Note (Signed)
Xray neg for fx Suspect strain  Given swelling - inst to use ace bandage - not too firmly Ice and elevate Update if not starting to improve in a week or if worsening

## 2012-03-05 NOTE — Assessment & Plan Note (Signed)
CT prior showed area though to be atelectasis Re check is scheduled for dec 10th  Pt now has persistent cough and continues to loose wt  Will ref to pulm for eval after she has her CT

## 2012-03-05 NOTE — Assessment & Plan Note (Signed)
Still unexplained - as pt states her eating has not changed at all  She does seem more anxious and stressed Has f/u planned with GI  Made ref to pulmonary as well after her CT re check

## 2012-03-06 ENCOUNTER — Telehealth: Payer: Self-pay | Admitting: Family Medicine

## 2012-03-06 NOTE — Telephone Encounter (Signed)
Pt.notified

## 2012-03-06 NOTE — Telephone Encounter (Signed)
Called to check on pt per your request. Pt said she is not feeling any better, she is still very fatigue and has no energy, her cough is the same, and pt wants to know if you can call her in a cough med or suggest a cough med OTC she can take. Pt did say that her hand is doing a lot better, the swelling is almost completley gone

## 2012-03-06 NOTE — Telephone Encounter (Signed)
I'm so glad her hand is better!  For cough I recommend mucinex DM otc - and see the pulmonary doctor as planned

## 2012-03-10 ENCOUNTER — Ambulatory Visit: Payer: Self-pay | Admitting: Family Medicine

## 2012-03-11 ENCOUNTER — Encounter: Payer: Self-pay | Admitting: Family Medicine

## 2012-03-12 ENCOUNTER — Encounter: Payer: Self-pay | Admitting: *Deleted

## 2012-03-17 ENCOUNTER — Telehealth: Payer: Self-pay | Admitting: Family Medicine

## 2012-03-17 ENCOUNTER — Institutional Professional Consult (permissible substitution): Payer: Medicare Other | Admitting: Pulmonary Disease

## 2012-03-17 MED ORDER — GUAIFENESIN-CODEINE 100-10 MG/5ML PO SYRP: 5.0000 mL | ORAL_SOLUTION | Freq: Four times a day (QID) | ORAL | Status: DC | PRN

## 2012-03-17 NOTE — Telephone Encounter (Signed)
Pt isn't allergic to codeine so called in Rx as prescribed, advise pt to use with caution due to sedating, pt did receive the letter an said she is going to keep appt with pulmonary

## 2012-03-17 NOTE — Telephone Encounter (Signed)
If she is not allergic to codeine please call in robitussin with codeine - see px  Warn her this can cause sedation- use great caution Please keep pulmonary visit and make sure she got the letter about her CT scan since the note indicates her phone was turned off and she was sent a letter instead thanks

## 2012-03-17 NOTE — Telephone Encounter (Signed)
Pt asking for RX for her cough that she has had "for months".  States that OTC meds are not helping.  Last OV 03/02/12.  She could not make it to her pulmonary appt and has rescheduled for 04/14/12 but states that she needs something before then.  Please advise.

## 2012-04-02 ENCOUNTER — Ambulatory Visit (INDEPENDENT_AMBULATORY_CARE_PROVIDER_SITE_OTHER): Payer: Medicare Other | Admitting: *Deleted

## 2012-04-02 ENCOUNTER — Telehealth: Payer: Self-pay | Admitting: *Deleted

## 2012-04-02 DIAGNOSIS — E538 Deficiency of other specified B group vitamins: Secondary | ICD-10-CM

## 2012-04-02 MED ORDER — CYANOCOBALAMIN 1000 MCG/ML IJ SOLN
1000.0000 ug | Freq: Once | INTRAMUSCULAR | Status: AC
  Administered 2012-04-02: 1000 ug via INTRAMUSCULAR

## 2012-04-02 MED ORDER — HYDROCOD POLST-CHLORPHEN POLST 10-8 MG/5ML PO LQCR: 5.0000 mL | Freq: Two times a day (BID) | ORAL | Status: DC | PRN

## 2012-04-02 NOTE — Telephone Encounter (Signed)
Let her know that tussionex is very very strong and sedating and also has potential for addiction so use only if absolutely necessary Px written for call in

## 2012-04-02 NOTE — Telephone Encounter (Signed)
Pt came in for a B12 injection today and wanted me to let you know that the Robitussin [facility] that you prescribed isn't helping her cough, pt is scheduled to see pulmon doctor on 04/14/12, but wanted to see if you could prescribed something different until then, pt said she has used Tussinex in the past and that has helped, please advise

## 2012-04-02 NOTE — Telephone Encounter (Signed)
Rx called in to pharm. as prescribed, left voicemail letting pt know Rx was called in an only use if necessary because its strong, sedating, and can be addicting

## 2012-04-14 ENCOUNTER — Encounter: Payer: Self-pay | Admitting: Pulmonary Disease

## 2012-04-14 ENCOUNTER — Ambulatory Visit (INDEPENDENT_AMBULATORY_CARE_PROVIDER_SITE_OTHER): Payer: Medicare Other | Admitting: Pulmonary Disease

## 2012-04-14 VITALS — BP 126/70 | HR 88 | Temp 98.5°F | Ht 62.0 in | Wt 145.0 lb

## 2012-04-14 DIAGNOSIS — R222 Localized swelling, mass and lump, trunk: Secondary | ICD-10-CM

## 2012-04-14 DIAGNOSIS — R918 Other nonspecific abnormal finding of lung field: Secondary | ICD-10-CM

## 2012-04-14 NOTE — Patient Instructions (Addendum)
We will make arrangements for you to have a bronchoscopy at [facility] next Wednesday (Jan 22) at 07:30.   Do not eat after midnight on Jan 21. You will need a driver to take you home afterwards.  We will see you back in clinic in 2-3 weeks or sooner if needed.

## 2012-04-14 NOTE — Assessment & Plan Note (Signed)
I am very concerned about the lung mass seen on the December 2013 chest CT. I explained to Jodi Bartlett that the differential diagnosis here includes lung cancer versus organizing pneumonia versus an atypical infection (i.e. fungal). Considering her weight loss and the characteristics of the image on CT I am very concerned about malignancy.  Her daughter was with her today and we discussed this differential diagnosis. They understand the gravity of the situation and realized that really our only choice is to proceed with a bronchoscopy. Other potential approaches to getting a diagnosis of be a CT guided needle biopsy versus a open lung biopsy. This lesion should be amenable a flexible bronchoscopy.  Plan: -Flexible optical bronchoscopy to be performed on 04/23/2011 at 7:30 the morning at [facility] as this time he works best for her family. She was instructed to not eat after midnight the night before the procedure.

## 2012-04-14 NOTE — Progress Notes (Signed)
Subjective:    Patient ID: Jodi Bartlett, female    DOB: April 09, 1935, 77 y.o.   MRN: [id number]  HPI  Jodi Bartlett is a very pleasant 77 year old female with a past medical history significant for ovarian cancer who comes to our clinic today for evaluation of a lung mass. She had a normal childhood without respiratory illnesses and has never smoked a cigarette. At age 46 she developed a mass in her abdomen and was found to have ovarian cancer. She was treated with a chemotherapy regimen and total abdominal hysterectomy. She does not remember what chemotherapy drug she received. She responded well to the therapy and has not had any evidence of recurrence to her knowledge.  Over the years she has experienced abdominal pain and recently underwent a colonoscopy and a CT of her abdomen. The colonoscopy (by report) was normal but the CT of her abdomen revealed findings suggestive of a lung mass in the left lung. CT chest was performed in 03/10/2012 to better characterize this lesion and showed a large left upper lobe mass.  In the last 3 months she has lost 14 pounds despite having a full appetite and eating large meals. She is also had a nagging, hacking, dry cough which has persisted for the last 3 months as well. For several years she has experienced an atypical chest pain which is substernal and radiates through to her back. She has had multiple evaluations for this and has been told recently that is do to esophageal spasm. She denies a significant change in her baseline reflux symptoms lately.  In the last several months she has had no fevers but she has experienced some chills.   Past Medical History  Diagnosis Date  . Anxiety   . Depressed   . GERD (gastroesophageal reflux disease)   . HTN (hypertension)   . Back pain   . OA (ocular albinism)   . Dizziness     vertigo  . Anemia     hx of  . Arthritis   . Interstitial cystitis   . IBS (irritable bowel syndrome)   . UTI (lower urinary  tract infection)     hx of    . Stricture and stenosis of esophagus 2009    EGD   . Hx of colonic polyps 2010    Colonoscopy  . Diverticulosis 2010    Colonoscopy  . Ovarian cancer   . Hiatal hernia     EGD   . Allergy   . Blood transfusion without reported diagnosis      Family History  Problem Relation Age of Onset  . Coronary artery disease Father   . Hypertension Father   . Heart disease Father     CAD  . Hypertension Sister   . Hyperlipidemia Sister   . Stroke Mother   . Colon cancer Neg Hx   . Esophageal cancer Neg Hx   . Rectal cancer Neg Hx   . Stomach cancer Neg Hx      History   Social History  . Marital Status: Widowed    Spouse Name: N/A    Number of Children: 1  . Years of Education: N/A   Occupational History  . COSMETOLOGIST     Retired    Social History Main Topics  . Smoking status: Never Smoker   . Smokeless tobacco: Never Used  . Alcohol Use: Yes     Comment: social wine   . Drug Use: No  . Sexually Active: Not on  file   Other Topics Concern  . Not on file   Social History Narrative   1 daughter. 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 Daily caffeine      No Known Allergies   Outpatient Prescriptions Prior to Visit  Medication Sig Dispense Refill  . ALPRAZolam (XANAX) 0.25 MG tablet Take 0.25 mg by mouth every 4 (four) hours as needed.        Marland Kitchen amitriptyline (ELAVIL) 100 MG tablet Take 1 tablet (100 mg total) by mouth at bedtime.  90 tablet  3  . bisoprolol (ZEBETA) 5 MG tablet Take 1/2 tablet by mouth daily  45 tablet  3  . buPROPion (WELLBUTRIN SR) 150 MG 12 hr tablet Take 300 mg by mouth daily.       . cholecalciferol (VITAMIN D) 1000 UNITS tablet Take 1,000 Units by mouth 2 (two) times daily.       . cyanocobalamin (,VITAMIN B-12,) 1000 MCG/ML injection Inject 1,000 mcg into the muscle every 30 (thirty) days.       . hyoscyamine (LEVSIN SL) 0.125 MG SL tablet Place 1 tablet (0.125 mg total) under the  tongue every 4 (four) hours as needed for cramping.  30 tablet  0  . naproxen sodium (ANAPROX) 220 MG tablet Take 220 mg by mouth as needed.      . pantoprazole (PROTONIX) 40 MG tablet Take 1 tablet (40 mg total) by mouth daily.  30 tablet  11  . triamterene-hydrochlorothiazide (DYAZIDE) 37.5-25 MG per capsule Take 1 each (1 capsule total) by mouth daily.  90 capsule  3  . [DISCONTINUED] chlorpheniramine-HYDROcodone (TUSSIONEX PENNKINETIC ER) 10-8 MG/5ML LQCR Take 5 mLs by mouth every 12 (twelve) hours as needed.  120 mL  0   Last reviewed on 04/14/2012  3:23 PM by Christen Butter, CMA    Review of Systems  Constitutional: Positive for chills. Negative for fever and unexpected weight change.  HENT: Positive for trouble swallowing. Negative for ear pain, nosebleeds, congestion, sore throat, rhinorrhea, sneezing, dental problem, voice change, postnasal drip and sinus pressure.   Eyes: Negative for visual disturbance.  Respiratory: Positive for cough. Negative for choking and shortness of breath.   Cardiovascular: Positive for chest pain. Negative for leg swelling.  Gastrointestinal: Negative for vomiting, abdominal pain and diarrhea.  Genitourinary: Negative for difficulty urinating.  Musculoskeletal: Positive for arthralgias.  Skin: Negative for rash.  Neurological: Positive for headaches. Negative for tremors and syncope.  Hematological: Bruises/bleeds easily.       Objective:   Physical Exam  Filed Vitals:   04/14/12 1523  BP: 126/70  Pulse: 88  Temp: 98.5 F (36.9 C)   room air  Gen: well appearing, no acute distress HEENT: NCAT, PERRL, EOMi, OP clear, neck supple without masses, no lymphadenopathy PULM: Few crackles left lung, no egophony: otherwise clear to auscultation CV: RRR, no mgr, no JVD AB: BS+, soft, nontender, no hsm Ext: warm, no edema, no clubbing, no cyanosis Derm: no rash or skin breakdown Neuro: A&Ox4, CN II-XII intact, strength 5/5 in all 4  extremities  03/10/2012 CT chest>> personally reviewed by me. There is a large mass in the left upper lobe with irregular borders and consolidative consistency. There are multiple airways running through it which appear to be patent. The airways which appear to have direct contact with the mass are the anterior segment of the left upper lobe as well as the superior segment of the lingula. There is also hilar and mediastinal  lymphadenopathy. None of the nodes are larger than 1 cm but the subcarinal nodes the largest is 0.9 cm.     Assessment & Plan:   Lung mass I am very concerned about the lung mass seen on the December 2013 chest CT. I explained to Ms. Esperanza that the differential diagnosis here includes lung cancer versus organizing pneumonia versus an atypical infection (i.e. fungal). Considering her weight loss and the characteristics of the image on CT I am very concerned about malignancy.  Her daughter was with her today and we discussed this differential diagnosis. They understand the gravity of the situation and realized that really our only choice is to proceed with a bronchoscopy. Other potential approaches to getting a diagnosis of be a CT guided needle biopsy versus a open lung biopsy. This lesion should be amenable a flexible bronchoscopy.  Plan: -Flexible optical bronchoscopy to be performed on 04/23/2011 at 7:30 the morning at [facility] as this time he works best for her family. She was instructed to not eat after midnight the night before the procedure.   Updated Medication List Outpatient Encounter Prescriptions as of 04/14/2012  Medication Sig Dispense Refill  . ALPRAZolam (XANAX) 0.25 MG tablet Take 0.25 mg by mouth every 4 (four) hours as needed.        Marland Kitchen amitriptyline (ELAVIL) 100 MG tablet Take 1 tablet (100 mg total) by mouth at bedtime.  90 tablet  3  . bisoprolol (ZEBETA) 5 MG tablet Take 1/2 tablet by mouth daily  45 tablet  3  . buPROPion (WELLBUTRIN SR) 150  MG 12 hr tablet Take 300 mg by mouth daily.       . cholecalciferol (VITAMIN D) 1000 UNITS tablet Take 1,000 Units by mouth 2 (two) times daily.       . cyanocobalamin (,VITAMIN B-12,) 1000 MCG/ML injection Inject 1,000 mcg into the muscle every 30 (thirty) days.       . hyoscyamine (LEVSIN SL) 0.125 MG SL tablet Place 1 tablet (0.125 mg total) under the tongue every 4 (four) hours as needed for cramping.  30 tablet  0  . naproxen sodium (ANAPROX) 220 MG tablet Take 220 mg by mouth as needed.      . pantoprazole (PROTONIX) 40 MG tablet Take 1 tablet (40 mg total) by mouth daily.  30 tablet  11  . triamterene-hydrochlorothiazide (DYAZIDE) 37.5-25 MG per capsule Take 1 each (1 capsule total) by mouth daily.  90 capsule  3  . [DISCONTINUED] chlorpheniramine-HYDROcodone (TUSSIONEX PENNKINETIC ER) 10-8 MG/5ML LQCR Take 5 mLs by mouth every 12 (twelve) hours as needed.  120 mL  0

## 2012-04-20 ENCOUNTER — Telehealth: Payer: Self-pay | Admitting: *Deleted

## 2012-04-20 DIAGNOSIS — Z139 Encounter for screening, unspecified: Secondary | ICD-10-CM

## 2012-04-20 NOTE — Telephone Encounter (Signed)
Message copied by Christen Butter on Mon Apr 20, 2012  8:56 AM ------      Message from: Max Fickle B      Created: Mon Apr 20, 2012  8:33 AM      Regarding: Labs       Verlon Au,            Can you have [name] get a PT/INR and CBC today or tomorrow morning?  Forgot to order it last week.  Need to get results before her Wed AM bronch.            Thanks,      Kipp Brood

## 2012-04-20 NOTE — Telephone Encounter (Signed)
LMTCB for the pt 

## 2012-04-21 ENCOUNTER — Other Ambulatory Visit (INDEPENDENT_AMBULATORY_CARE_PROVIDER_SITE_OTHER): Payer: Medicare Other

## 2012-04-21 ENCOUNTER — Telehealth: Payer: Self-pay | Admitting: Pulmonary Disease

## 2012-04-21 DIAGNOSIS — Z139 Encounter for screening, unspecified: Secondary | ICD-10-CM

## 2012-04-21 LAB — CBC WITH DIFFERENTIAL/PLATELET
Basophils Absolute: 0.1 10*3/uL (ref 0.0–0.1)
Basophils Relative: 0.6 % (ref 0.0–3.0)
Eosinophils Absolute: 0.4 10*3/uL (ref 0.0–0.7)
Hemoglobin: 10.4 g/dL — ABNORMAL LOW (ref 12.0–15.0)
Lymphocytes Relative: 21 % (ref 12.0–46.0)
Lymphs Abs: 1.9 10*3/uL (ref 0.7–4.0)
MCHC: 33.2 g/dL (ref 30.0–36.0)
MCV: 91.6 fl (ref 78.0–100.0)
Monocytes Absolute: 0.5 10*3/uL (ref 0.1–1.0)
Neutro Abs: 6 10*3/uL (ref 1.4–7.7)
RDW: 14.5 % (ref 11.5–14.6)

## 2012-04-21 LAB — PROTIME-INR: Prothrombin Time: 11 s (ref 10.2–12.4)

## 2012-04-21 NOTE — Telephone Encounter (Signed)
Bronch does not require precert and bernadette is aware Tobe Sos

## 2012-04-21 NOTE — Telephone Encounter (Signed)
Spoke with the pt and notified needs to have PT/INR and CBC done prior to having bronch done tomorrow. Pt verbalized understanding, will come to [facility] lab this am to have this done.

## 2012-04-22 ENCOUNTER — Encounter (HOSPITAL_COMMUNITY): Payer: Self-pay | Admitting: Respiratory Therapy

## 2012-04-22 ENCOUNTER — Ambulatory Visit (HOSPITAL_COMMUNITY): Payer: Medicare Other

## 2012-04-22 ENCOUNTER — Ambulatory Visit (HOSPITAL_COMMUNITY)
Admission: RE | Admit: 2012-04-22 | Discharge: 2012-04-22 | Disposition: A | Discharge status: Home / Self Care | Payer: Medicare Other | Source: Ambulatory Visit | Attending: Pulmonary Disease | Admitting: Pulmonary Disease

## 2012-04-22 ENCOUNTER — Encounter (HOSPITAL_COMMUNITY)
Admission: RE | Disposition: A | Discharge status: Home / Self Care | Payer: Self-pay | Source: Ambulatory Visit | Attending: Pulmonary Disease

## 2012-04-22 DIAGNOSIS — I10 Essential (primary) hypertension: Secondary | ICD-10-CM | POA: Insufficient documentation

## 2012-04-22 DIAGNOSIS — Z85118 Personal history of other malignant neoplasm of bronchus and lung: Secondary | ICD-10-CM | POA: Insufficient documentation

## 2012-04-22 DIAGNOSIS — K219 Gastro-esophageal reflux disease without esophagitis: Secondary | ICD-10-CM | POA: Insufficient documentation

## 2012-04-22 DIAGNOSIS — R634 Abnormal weight loss: Secondary | ICD-10-CM

## 2012-04-22 DIAGNOSIS — Z9221 Personal history of antineoplastic chemotherapy: Secondary | ICD-10-CM | POA: Insufficient documentation

## 2012-04-22 DIAGNOSIS — R05 Cough: Secondary | ICD-10-CM

## 2012-04-22 DIAGNOSIS — R918 Other nonspecific abnormal finding of lung field: Secondary | ICD-10-CM

## 2012-04-22 DIAGNOSIS — J984 Other disorders of lung: Secondary | ICD-10-CM

## 2012-04-22 DIAGNOSIS — R222 Localized swelling, mass and lump, trunk: Secondary | ICD-10-CM

## 2012-04-22 DIAGNOSIS — Z90710 Acquired absence of both cervix and uterus: Secondary | ICD-10-CM | POA: Insufficient documentation

## 2012-04-22 DIAGNOSIS — Z79899 Other long term (current) drug therapy: Secondary | ICD-10-CM | POA: Insufficient documentation

## 2012-04-22 HISTORY — PX: VIDEO BRONCHOSCOPY: SHX5072

## 2012-04-22 SURGERY — BRONCHOSCOPY, WITH FLUOROSCOPY
Anesthesia: Moderate Sedation | Laterality: Bilateral

## 2012-04-22 MED ORDER — FENTANYL CITRATE 0.05 MG/ML IJ SOLN
INTRAMUSCULAR | Status: DC | PRN
  Administered 2012-04-22 (×2): 25 ug via INTRAVENOUS

## 2012-04-22 MED ORDER — LIDOCAINE HCL 1 % IJ SOLN
INTRAMUSCULAR | Status: DC | PRN
  Administered 2012-04-22: 6 mL via RESPIRATORY_TRACT

## 2012-04-22 MED ORDER — LIDOCAINE HCL 2 % EX GEL
CUTANEOUS | Status: DC | PRN
  Administered 2012-04-22: 1

## 2012-04-22 MED ORDER — PHENYLEPHRINE HCL 0.25 % NA SOLN
NASAL | Status: DC | PRN
  Administered 2012-04-22: 1

## 2012-04-22 MED ORDER — MIDAZOLAM HCL 10 MG/2ML IJ SOLN
INTRAMUSCULAR | Status: AC
  Filled 2012-04-22: qty 4

## 2012-04-22 MED ORDER — FENTANYL CITRATE 0.05 MG/ML IJ SOLN
INTRAMUSCULAR | Status: AC
Start: 2012-04-22 — End: 2012-04-22
  Filled 2012-04-22: qty 4

## 2012-04-22 MED ORDER — MIDAZOLAM HCL 10 MG/2ML IJ SOLN
INTRAMUSCULAR | Status: DC | PRN
  Administered 2012-04-22 (×2): 1 mg via INTRAVENOUS

## 2012-04-22 MED ORDER — SODIUM CHLORIDE 0.9 % IV SOLN
INTRAVENOUS | Status: DC
  Administered 2012-04-22: 07:00:00 via INTRAVENOUS

## 2012-04-22 NOTE — H&P (Addendum)
  I have seen and examined Jodi Bartlett this morning and she is doing OK.  No changes from my note one week ago.  She is ready for her bronchoscopy for the LUL mass.    Gen: well appearing, no acute distress HEENT: NCAT, PERRL, EOMi, OP clear, PULM: CTA B CV: RRR, no mgr, no JVD Neuro: A&Ox4, CN II-XII intact, strength 5/5 in all 4 extremities

## 2012-04-22 NOTE — Progress Notes (Signed)
Video Bronchoscopy Done  Intervention Bronchial washings done Intervention Bronchial Biopsy done  Procedure tolerated well

## 2012-04-22 NOTE — Op Note (Signed)
PCCM Bronchoscopy Procedure Note  The patient was informed of the risks (including but not limited to bleeding, infection, respiratory failure, lung injury, tooth/oral injury) and benefits of the procedure and gave consent, see chart.  Indication: LUL mass  Location: [facility]  Condition pre procedure: Stable, ASA 2  Medications for procedure: Fentanyl , Versed 2mg ; Lidocaine 1% plain 12 cc on cords, trachea, and bilateral mainstem bronchi; 6cc in nebulizer  Procedure description: The bronchoscope was introduced through the nose and passed to the bilateral lungs to the level of the subsegmental bronchi throughout the tracheobronchial tree.  Airway exam revealed no airway lesion identified no endobronchial lesion or mass.  There were no secretions, no inflammation.  The larynx was normal in shape and function.  Procedures performed:  1) Transbronchial biopsies x5 in the superior segment of the lingula under fluoro guidance; minimal bleeding 2) BAL LUL lingula 60cc saline injected, 15cc blood tinged fluid returned  Specimens sent:  1) BAL >> cytology, and bacterial, fungal, AFB culture 2) Transbronchial biopsies for histology  Bleeding: minimal  Condition post procedure: Good, stable  Patient instructions:  We will call you with results from the biopsy and bal when available.  [name] PCCM Pager: [phone] Cell: [phone] If no response, call [phone]

## 2012-04-23 ENCOUNTER — Encounter (HOSPITAL_COMMUNITY): Payer: Self-pay | Admitting: Pulmonary Disease

## 2012-04-24 LAB — CULTURE, RESPIRATORY W GRAM STAIN: Special Requests: NORMAL

## 2012-04-27 ENCOUNTER — Telehealth: Payer: Self-pay | Admitting: Pulmonary Disease

## 2012-04-27 ENCOUNTER — Ambulatory Visit (INDEPENDENT_AMBULATORY_CARE_PROVIDER_SITE_OTHER): Payer: Medicare Other | Admitting: Pulmonary Disease

## 2012-04-27 ENCOUNTER — Encounter: Payer: Self-pay | Admitting: Pulmonary Disease

## 2012-04-27 VITALS — BP 140/80 | HR 80 | Temp 98.2°F | Ht 62.0 in | Wt 142.0 lb

## 2012-04-27 DIAGNOSIS — R222 Localized swelling, mass and lump, trunk: Secondary | ICD-10-CM

## 2012-04-27 DIAGNOSIS — R918 Other nonspecific abnormal finding of lung field: Secondary | ICD-10-CM

## 2012-04-27 NOTE — Telephone Encounter (Signed)
Spoke to pt she is aware pft's scheduled at [facility] 04/30/12@11 :00am order faxed also Tobe Sos

## 2012-04-27 NOTE — Assessment & Plan Note (Signed)
Unfortunately we did not get an answer from Ms. Ruble's recent bronchoscopy with transbronchial biopsy and bal of the left upper lobe mass.  The mass was seen under fluoro during the procedure and on the CXR post procedure.  I'm still worried about lung cancer vs organizing pneumonia.    Plan: -we have made a referral to Dr. Hulda Marin, the thoracic surgeon with the [facility] for an open lung biopsy -Full PFT's ordered at [facility]

## 2012-04-27 NOTE — Progress Notes (Signed)
Subjective:    Patient ID: Jodi Bartlett, female    DOB: 12/12/1935, 77 y.o.   MRN: [id number]  Synopsis: Jodi Bartlett was first seen by the [facility] office in January 2014 for evaluation of a lung mass.  The lesion was seen on CT in 03/2012 when ordered in follow up after a CT abdomen study showed a lung abnormality.  She  HPI  April 27 2012 follow up -- Jodi Bartlett still has cough and fatigue and just doesn't feel like herself.  Unfortunately we were unable to get an answer from the bronchoscopy last week.  She said that she tolerated the bronchoscopy well.  She has not developed new chest pain or hemoptysis in the last week.  No new fevers or chills.     Past Medical History  Diagnosis Date  . Anxiety   . Depressed   . GERD (gastroesophageal reflux disease)   . HTN (hypertension)   . Back pain   . OA (ocular albinism)   . Dizziness     vertigo  . Anemia     hx of  . Arthritis   . Interstitial cystitis   . IBS (irritable bowel syndrome)   . UTI (lower urinary tract infection)     hx of    . Stricture and stenosis of esophagus 2009    EGD   . Hx of colonic polyps 2010    Colonoscopy  . Diverticulosis 2010    Colonoscopy  . Ovarian cancer   . Hiatal hernia     EGD   . Allergy   . Blood transfusion without reported diagnosis     Review of Systems     Objective:   Physical Exam  Filed Vitals:   04/27/12 0908  BP: 140/80  Pulse: 80  Temp: 98.2 F (36.8 C)  TempSrc: Oral  Height: 5\' 2"  (1.575 m)  Weight: 142 lb (64.411 kg)  SpO2: 92%   Gen: well appearing, no acute distress HEENT: NCAT, PERRL, EOMi, OP clear, neck supple without masses PULM: few crackles upper lobes CV: RRR, no mgr, no JVD AB: BS+, soft, nontender, no hsm Ext: warm, no edema, no clubbing, no cyanosis        Assessment & Plan:   Lung mass Unfortunately we did not get an answer from Jodi Bartlett's recent bronchoscopy with transbronchial biopsy and bal of the left upper lobe  mass.  The mass was seen under fluoro during the procedure and on the CXR post procedure.  I'm still worried about lung cancer vs organizing pneumonia.    Plan: -we have made a referral to Dr. Hulda Marin, the thoracic surgeon with the [facility] for an open lung biopsy -Full PFT's ordered at [facility]   Updated Medication List Outpatient Encounter Prescriptions as of 04/27/2012  Medication Sig Dispense Refill  . ALPRAZolam (XANAX) 0.25 MG tablet Take 0.25 mg by mouth every 4 (four) hours as needed.        Marland Kitchen amitriptyline (ELAVIL) 100 MG tablet Take 1 tablet (100 mg total) by mouth at bedtime.  90 tablet  3  . bisoprolol (ZEBETA) 5 MG tablet Take 1/2 tablet by mouth daily  45 tablet  3  . buPROPion (WELLBUTRIN SR) 150 MG 12 hr tablet Take 300 mg by mouth daily.       . cholecalciferol (VITAMIN D) 1000 UNITS tablet Take 1,000 Units by mouth 2 (two) times daily.       . cyanocobalamin (,VITAMIN B-12,) 1000  MCG/ML injection Inject 1,000 mcg into the muscle every 30 (thirty) days.       . hyoscyamine (LEVSIN SL) 0.125 MG SL tablet Place 1 tablet (0.125 mg total) under the tongue every 4 (four) hours as needed for cramping.  30 tablet  0  . naproxen sodium (ANAPROX) 220 MG tablet Take 220 mg by mouth as needed.      . pantoprazole (PROTONIX) 40 MG tablet Take 1 tablet (40 mg total) by mouth daily.  30 tablet  11  . triamterene-hydrochlorothiazide (DYAZIDE) 37.5-25 MG per capsule Take 1 each (1 capsule total) by mouth daily.  90 capsule  3

## 2012-04-27 NOTE — Patient Instructions (Addendum)
Follow up with Dr. Thelma Barge as scheduled by Glenna Fellows  We will see you back in one month

## 2012-04-29 ENCOUNTER — Telehealth: Payer: Self-pay | Admitting: *Deleted

## 2012-04-29 ENCOUNTER — Ambulatory Visit: Payer: Self-pay | Admitting: Cardiothoracic Surgery

## 2012-04-29 ENCOUNTER — Telehealth: Payer: Self-pay | Admitting: Pulmonary Disease

## 2012-04-29 MED ORDER — AMITRIPTYLINE HCL 100 MG PO TABS: 100.0000 mg | ORAL_TABLET | Freq: Every day | ORAL | Status: DC

## 2012-04-29 NOTE — Telephone Encounter (Signed)
Called pharm and let them know and called in new Rx for #30 [phone] refills

## 2012-04-29 NOTE — Telephone Encounter (Signed)
The pt is calling because she is afraid the orads will be too icy in the morning and she will not be able to make her appt for PFT at [facility]. She does not know if the test is needed before she sees Dr. Inez Catalina on Mon., 05/04/12. I tried to contact Dr. Inez Catalina office with no success. I gave the pt the # to call [facility] in the [place] so she can reschedule the PFT if the roads look too bad.  [phone]. Will forward to Dr. Kendrick Fries so he is aware.

## 2012-04-29 NOTE — Telephone Encounter (Signed)
OK to reschedule for later this week

## 2012-04-29 NOTE — Telephone Encounter (Signed)
Received a faxed request from pharmacy for prior auth of amitriptyline 100mg . Per insurance pt can get 30 day supply without prior auth but 90 day requires prior auth. Forms printed and placed in your in box.

## 2012-04-29 NOTE — Telephone Encounter (Signed)
Pt calling again in ref to previous msg.Jodi Bartlett ° ° °

## 2012-04-29 NOTE — Telephone Encounter (Signed)
I looked at prior auth- and have to verify that she has had allergies or intolerances of other agents (which she has not) so we will have to change her to 30 day supply Go ahead and re write her px for 30 day supply with a year of refils, thanks

## 2012-05-04 ENCOUNTER — Ambulatory Visit: Payer: Self-pay | Admitting: Cardiothoracic Surgery

## 2012-05-05 ENCOUNTER — Ambulatory Visit: Payer: Self-pay | Admitting: Pulmonary Disease

## 2012-05-05 LAB — PULMONARY FUNCTION TEST

## 2012-05-07 ENCOUNTER — Telehealth: Payer: Self-pay | Admitting: Pulmonary Disease

## 2012-05-07 NOTE — Telephone Encounter (Signed)
I spoke with pt. She stated she saw oncology yesterday-Dr. Thelma Barge over at [facility]. She stated he keep advising her nothing would grow so fast from a CT scan done in 12/2011-03/2012. Pt stated she has only had 1 CT scan done of her lungs and the other one done in sept 2013 was abdomen and pelvis. She is confused to what he was saying. He sent her to have another CXR done and is awaiting these results. She just wanted to make Dr. Kendrick Fries aware of what was going on.

## 2012-05-08 ENCOUNTER — Other Ambulatory Visit: Payer: Self-pay | Admitting: Pulmonary Disease

## 2012-05-08 MED ORDER — LEVOFLOXACIN 500 MG PO TABS: 500.0000 mg | ORAL_TABLET | Freq: Every day | ORAL | Status: DC

## 2012-05-08 NOTE — Telephone Encounter (Signed)
rx for the levaquin 500 mg #7  1 daily.  This has been sent to the pharmacy.  i called the pt and she has appt with [facility] in [place] on 2-18 at 11;15 and pt is aware. Nothing further is needed.

## 2012-05-19 ENCOUNTER — Ambulatory Visit (INDEPENDENT_AMBULATORY_CARE_PROVIDER_SITE_OTHER): Payer: Medicare Other | Admitting: Pulmonary Disease

## 2012-05-19 VITALS — BP 120/80 | HR 85 | Ht 63.0 in | Wt 144.0 lb

## 2012-05-19 DIAGNOSIS — R918 Other nonspecific abnormal finding of lung field: Secondary | ICD-10-CM

## 2012-05-19 DIAGNOSIS — R222 Localized swelling, mass and lump, trunk: Secondary | ICD-10-CM

## 2012-05-19 MED ORDER — HYDROCOD POLST-CHLORPHEN POLST 10-8 MG/5ML PO LQCR: 5.0000 mL | Freq: Every evening | ORAL | Status: DC | PRN

## 2012-05-19 NOTE — Patient Instructions (Signed)
Use the tussionex at night as needed for the cough; do not take this and drive  For chest congestion: Take robitussion or mucinex  For cough: Take tussionex as needed at night and delsym during the day  We will send you for a CT scan at [facility]  We will see you back in one month

## 2012-05-19 NOTE — Assessment & Plan Note (Signed)
The lung mass has dramatically decreased in size as noted on the 12/02/2012 chest x-ray performed at [facility]. I explained to [facility] that I thought the differential diagnosis included either a result bacterial pneumonia versus an atypical (inflammatory) pneumonia.  At this point we will continue to follow the abnormality with a repeat CT scan. Also provide her with a cough regimen as well. If she develops worsening weight loss, hemoptysis, or if the imaging abnormality is persistent then we will need to consider an alternative approach to biopsy.

## 2012-05-19 NOTE — Progress Notes (Signed)
Subjective:    Patient ID: Jodi Bartlett, female    DOB: 1936/02/18, 77 y.o.   MRN: [id number]  Synopsis: Jodi Bartlett was first seen by the [facility] Pulmonary office in January 2014 for evaluation of a lung mass.  The lesion was seen on CT in 03/2012 when ordered in follow up after a CT abdomen study showed a lung abnormality.  She  HPI   April 27 2012 follow up -- Jodi Bartlett still has cough and fatigue and just doesn't feel like herself.  Unfortunately we were unable to get an answer from the bronchoscopy last week.  She said that she tolerated the bronchoscopy well.  She has not developed new chest pain or hemoptysis in the last week.  No new fevers or chills.    05/19/12 ROV -- Jodi Bartlett has continued to cough since her last visit. She took the Levaquin we called in and she says that it did not affect the cough. She still has some chest pain on the left side. Her weight is up by about 2 pounds. She is not taking anything for cough either over-the-counter or prescription. She does not have fevers or chills. She occasionally produces some clear sputum.   Past Medical History  Diagnosis Date  . Anxiety   . Depressed   . GERD (gastroesophageal reflux disease)   . HTN (hypertension)   . Back pain   . OA (ocular albinism)   . Dizziness     vertigo  . Anemia     hx of  . Arthritis   . Interstitial cystitis   . IBS (irritable bowel syndrome)   . UTI (lower urinary tract infection)     hx of    . Stricture and stenosis of esophagus 2009    EGD   . Hx of colonic polyps 2010    Colonoscopy  . Diverticulosis 2010    Colonoscopy  . Ovarian cancer   . Hiatal hernia     EGD   . Allergy   . Blood transfusion without reported diagnosis     Review of Systems      Objective:   Physical Exam   Filed Vitals:   05/19/12 1125  BP: 120/80  Pulse: 85  Height: 5\' 3"  (1.6 m)  Weight: 144 lb (65.318 kg)  SpO2: 98%   Gen: well appearing, no acute distress HEENT: NCAT, PERRL, EOMi,  OP clear, neck supple without masses PULM: Clear to auscultation bilaterally CV: RRR, no mgr, no JVD AB: BS+, soft, nontender, no hsm Ext: warm, no edema, no clubbing, no cyanosis        Assessment & Plan:   Lung mass The lung mass has dramatically decreased in size as noted on the 12/02/2012 chest x-ray performed at [facility]. I explained to [facility] that I thought the differential diagnosis included either a result bacterial pneumonia versus an atypical (inflammatory) pneumonia.  At this point we will continue to follow the abnormality with a repeat CT scan. Also provide her with a cough regimen as well. If she develops worsening weight loss, hemoptysis, or if the imaging abnormality is persistent then we will need to consider an alternative approach to biopsy.     Updated Medication List Outpatient Encounter Prescriptions as of 05/19/2012  Medication Sig Dispense Refill  . ALPRAZolam (XANAX) 0.25 MG tablet Take 0.25 mg by mouth every 4 (four) hours as needed.        Marland Kitchen amitriptyline (ELAVIL) 100 MG tablet Take 1  tablet (100 mg total) by mouth at bedtime.  30 tablet  11  . bisoprolol (ZEBETA) 5 MG tablet Take 1/2 tablet by mouth daily  45 tablet  3  . buPROPion (WELLBUTRIN SR) 150 MG 12 hr tablet Take 300 mg by mouth daily.       . cholecalciferol (VITAMIN D) 1000 UNITS tablet Take 1,000 Units by mouth 2 (two) times daily.       . cyanocobalamin (,VITAMIN B-12,) 1000 MCG/ML injection Inject 1,000 mcg into the muscle every 30 (thirty) days.       . hyoscyamine (LEVSIN SL) 0.125 MG SL tablet Place 1 tablet (0.125 mg total) under the tongue every 4 (four) hours as needed for cramping.  30 tablet  0  . naproxen sodium (ANAPROX) 220 MG tablet Take 220 mg by mouth as needed.      . pantoprazole (PROTONIX) 40 MG tablet Take 1 tablet (40 mg total) by mouth daily.  30 tablet  11  . triamterene-hydrochlorothiazide (DYAZIDE) 37.5-25 MG per capsule Take 1 each (1 capsule  total) by mouth daily.  90 capsule  3  . [DISCONTINUED] levofloxacin (LEVAQUIN) 500 MG tablet Take 1 tablet (500 mg total) by mouth daily.  7 tablet  0   No facility-administered encounter medications on file as of 05/19/2012.

## 2012-05-20 ENCOUNTER — Encounter: Payer: Self-pay | Admitting: Pulmonary Disease

## 2012-05-20 ENCOUNTER — Telehealth: Payer: Self-pay | Admitting: Pulmonary Disease

## 2012-05-20 ENCOUNTER — Telehealth: Payer: Self-pay | Admitting: *Deleted

## 2012-05-20 LAB — FUNGUS CULTURE W SMEAR: Fungal Smear: NONE SEEN

## 2012-05-20 NOTE — Telephone Encounter (Signed)
I spoke with the pt and she wanted to verify time and date of CT scan. PEr pcc ct is set for 06-18-12 at 11am at [facility]. Carron Curie, CMA

## 2012-05-20 NOTE — Telephone Encounter (Signed)
Message copied by Caryl Ada on Wed May 20, 2012 12:13 PM ------      Message from: Max Fickle B      Created: Wed May 20, 2012 12:10 PM       L,            Please let her know that I have seen her PFT's and they were COMPLETELY NORMAL.  I was surprised that they looked so good.  This is good for her.            Kipp Brood ------

## 2012-05-20 NOTE — Telephone Encounter (Signed)
Pt is aware of PFT results. 

## 2012-06-02 ENCOUNTER — Ambulatory Visit: Payer: Medicare Other | Admitting: Pulmonary Disease

## 2012-06-06 LAB — AFB CULTURE WITH SMEAR (NOT AT ARMC): Special Requests: NORMAL

## 2012-06-16 ENCOUNTER — Ambulatory Visit: Payer: Medicare Other | Admitting: Pulmonary Disease

## 2012-06-18 ENCOUNTER — Ambulatory Visit: Payer: Self-pay | Admitting: Pulmonary Disease

## 2012-06-22 ENCOUNTER — Telehealth: Payer: Self-pay | Admitting: *Deleted

## 2012-06-22 ENCOUNTER — Encounter: Payer: Self-pay | Admitting: Pulmonary Disease

## 2012-06-22 NOTE — Telephone Encounter (Signed)
Pt is aware of results. 

## 2012-06-22 NOTE — Telephone Encounter (Signed)
LMTCB

## 2012-06-22 NOTE — Telephone Encounter (Signed)
Message copied by Caryl Ada on Mon Jun 22, 2012  1:31 PM ------      Message from: Max Fickle B      Created: Mon Jun 22, 2012  9:33 AM       L,            Please let her know that her March 20 CT chest showed complete resolution of the mass. I am happy to show her the images in the office if she would like.            B ------

## 2012-06-22 NOTE — Telephone Encounter (Signed)
Pt returned call. Jodi Bartlett  

## 2012-06-23 ENCOUNTER — Encounter: Payer: Self-pay | Admitting: Pulmonary Disease

## 2012-06-23 ENCOUNTER — Ambulatory Visit (INDEPENDENT_AMBULATORY_CARE_PROVIDER_SITE_OTHER): Payer: Medicare Other | Admitting: Pulmonary Disease

## 2012-06-23 ENCOUNTER — Ambulatory Visit: Payer: Medicare Other | Admitting: Pulmonary Disease

## 2012-06-23 VITALS — BP 110/64 | HR 67 | Temp 97.5°F | Ht 63.5 in | Wt 146.0 lb

## 2012-06-23 DIAGNOSIS — R079 Chest pain, unspecified: Secondary | ICD-10-CM | POA: Insufficient documentation

## 2012-06-23 DIAGNOSIS — R918 Other nonspecific abnormal finding of lung field: Secondary | ICD-10-CM

## 2012-06-23 DIAGNOSIS — R222 Localized swelling, mass and lump, trunk: Secondary | ICD-10-CM

## 2012-06-23 NOTE — Progress Notes (Signed)
Subjective:    Patient ID: Jodi Bartlett, female    DOB: 10-20-1935, 77 y.o.   MRN: [id number]  Synopsis: Jodi Bartlett was first seen by the [facility] Pulmonary office in January 2014 for evaluation of a lung mass.  The lesion was seen on CT in 03/2012 when ordered in follow up after a CT abdomen study showed a lung abnormality.  She underwent a bronchoscopy in January 2014 which revealed no abnormalities on both transbronchial biopsy, and BAL for culture and cytology. A repeat chest CT performed in March 2014 showed near complete resolution of the consolidation/mass seen previously in December 2013.  HPI   April 27 2012 follow up -- Jodi Bartlett still has cough and fatigue and just doesn't feel like herself.  Unfortunately we were unable to get an answer from the bronchoscopy last week.  She said that she tolerated the bronchoscopy well.  She has not developed new chest pain or hemoptysis in the last week.  No new fevers or chills.    05/19/12 ROV -- Jodi Bartlett has continued to cough since her last visit. She took the Levaquin we called in and she says that it did not affect the cough. She still has some chest pain on the left side. Her weight is up by about 2 pounds. She is not taking anything for cough either over-the-counter or prescription. She does not have fevers or chills. She occasionally produces some clear sputum.  06/23/12 ROV -- Jodi Bartlett says that her cough has nearly completely resolved since the last visit. She still has some fatigue and states that she regularly has sharp, substernal chest pain nearly every day. This occurs with exertion and sometimes at rest. She has tried taking nitroglycerin sublingual tablets but this has not helped. She states that it does not correlate with eating. Sometimes it is associated with dizziness. She has not had a stress test recently. She continues to follow with her gastroenterologist in [place], Dr. Marina Goodell, for her chronic abdominal bloating and epigastric  pain.    Past Medical History  Diagnosis Date  . Anxiety   . Depressed   . GERD (gastroesophageal reflux disease)   . HTN (hypertension)   . Back pain   . OA (ocular albinism)   . Dizziness     vertigo  . Anemia     hx of  . Arthritis   . Interstitial cystitis   . IBS (irritable bowel syndrome)   . UTI (lower urinary tract infection)     hx of    . Stricture and stenosis of esophagus 2009    EGD   . Hx of colonic polyps 2010    Colonoscopy  . Diverticulosis 2010    Colonoscopy  . Ovarian cancer   . Hiatal hernia     EGD   . Allergy   . Blood transfusion without reported diagnosis     Review of Systems  Constitutional: Positive for fatigue. Negative for chills and unexpected weight change.  HENT: Negative for congestion, rhinorrhea and postnasal drip.   Respiratory: Negative for cough and shortness of breath.   Cardiovascular: Positive for chest pain. Negative for palpitations and leg swelling.       Objective:   Physical Exam   Filed Vitals:   06/23/12 1131  BP: 110/64  Pulse: 67  Temp: 97.5 F (36.4 C)  TempSrc: Oral  Height: 5' 3.5" (1.613 m)  Weight: 146 lb (66.225 kg)  SpO2: 98%   Gen: well appearing, no acute distress  HEENT: NCAT, OP clear PULM: Clear to auscultation bilaterally CV: RRR, no mgr, no JVD AB: BS+, soft, nontender, no hsm Ext: warm, no edema, no clubbing, no cyanosis  06/18/2012 CT chest >> complete resolution of the lung mass, no mediastinal lymphadenopathy     Assessment & Plan:   Lung mass I am pleased that on the CT scan performed 5 days ago we see complete resolution of the mass. He does appear to be a small amount of scarring in the left lung. I explained to Jodi Bartlett that I think that the most likely explanation for the lung mass was lobar pneumonia versus much less likely an inflammatory pneumonia such as cryptogenic organizing pneumonia.  Given the fact that she has normal pulmonary function testing, she is no further  cough and shortness of breath has improved there is really no reason for her to continue to followup with [place]. Have advised that she can followup with [place] on an as-needed basis.  Chest pain Though I believe that her sharp, substernal chest pain is most likely related to her gastroesophageal reflux and other gastrointestinal issues, it has been a while since she has had a cardiac workup. This is atypical angina but given her extensive workup in the last several months is reasonable for her to see a cardiologist to consider either cardiac stress testing or other evaluation.  I will refer her to [facility] cardiology here in [place].    Updated Medication List Outpatient Encounter Prescriptions as of 06/23/2012  Medication Sig Dispense Refill  . ALPRAZolam (XANAX) 0.25 MG tablet Take 0.25 mg by mouth every 4 (four) hours as needed.        Marland Kitchen amitriptyline (ELAVIL) 100 MG tablet Take 1 tablet (100 mg total) by mouth at bedtime.  30 tablet  11  . bisoprolol (ZEBETA) 5 MG tablet Take 1/2 tablet by mouth daily  45 tablet  3  . buPROPion (WELLBUTRIN SR) 150 MG 12 hr tablet Take 300 mg by mouth daily.       . chlorpheniramine-HYDROcodone (TUSSIONEX PENNKINETIC ER) 10-8 MG/5ML LQCR Take 5 mLs by mouth at bedtime as needed (cough).  140 mL  1  . cholecalciferol (VITAMIN D) 1000 UNITS tablet Take 1,000 Units by mouth 2 (two) times daily.       . cyanocobalamin (,VITAMIN B-12,) 1000 MCG/ML injection Inject 1,000 mcg into the muscle every 30 (thirty) days.       . hyoscyamine (LEVSIN SL) 0.125 MG SL tablet Place 1 tablet (0.125 mg total) under the tongue every 4 (four) hours as needed for cramping.  30 tablet  0  . naproxen sodium (ANAPROX) 220 MG tablet Take 220 mg by mouth as needed.      . pantoprazole (PROTONIX) 40 MG tablet Take 1 tablet (40 mg total) by mouth daily.  30 tablet  11  . triamterene-hydrochlorothiazide (DYAZIDE) 37.5-25 MG per capsule Take 1 each (1 capsule total) by mouth daily.  90 capsule   3   No facility-administered encounter medications on file as of 06/23/2012.

## 2012-06-23 NOTE — Patient Instructions (Signed)
We will refer you to Dr. Mariah Milling for chest pain  If he says your heart is OK, then we recommend that you start exercising regularly with a goal to walk 25 minutes daily  We will see you back as needed

## 2012-06-23 NOTE — Assessment & Plan Note (Signed)
I am pleased that on the CT scan performed 5 days ago we see complete resolution of the mass. He does appear to be a small amount of scarring in the left lung. I explained to Jodi Bartlett that I think that the most likely explanation for the lung mass was lobar pneumonia versus much less likely an inflammatory pneumonia such as cryptogenic organizing pneumonia.  Given the fact that she has normal pulmonary function testing, she is no further cough and shortness of breath has improved there is really no reason for her to continue to followup with [place]. Have advised that she can followup with [place] on an as-needed basis.

## 2012-06-23 NOTE — Assessment & Plan Note (Signed)
Though I believe that her sharp, substernal chest pain is most likely related to her gastroesophageal reflux and other gastrointestinal issues, it has been a while since she has had a cardiac workup. This is atypical angina but given her extensive workup in the last several months is reasonable for her to see a cardiologist to consider either cardiac stress testing or other evaluation.  I will refer her to [facility] cardiology here in [place].

## 2012-07-06 ENCOUNTER — Encounter: Payer: Self-pay | Admitting: Pulmonary Disease

## 2012-07-17 ENCOUNTER — Encounter: Payer: Self-pay | Admitting: Cardiovascular Disease

## 2012-07-17 ENCOUNTER — Ambulatory Visit (INDEPENDENT_AMBULATORY_CARE_PROVIDER_SITE_OTHER): Payer: Medicare Other | Admitting: Cardiovascular Disease

## 2012-07-17 VITALS — BP 122/80 | HR 100 | Ht 63.5 in | Wt 150.2 lb

## 2012-07-17 DIAGNOSIS — D649 Anemia, unspecified: Secondary | ICD-10-CM

## 2012-07-17 DIAGNOSIS — R0789 Other chest pain: Secondary | ICD-10-CM

## 2012-07-17 DIAGNOSIS — R42 Dizziness and giddiness: Secondary | ICD-10-CM

## 2012-07-17 DIAGNOSIS — G4700 Insomnia, unspecified: Secondary | ICD-10-CM

## 2012-07-17 DIAGNOSIS — K219 Gastro-esophageal reflux disease without esophagitis: Secondary | ICD-10-CM

## 2012-07-17 DIAGNOSIS — R0602 Shortness of breath: Secondary | ICD-10-CM

## 2012-07-17 DIAGNOSIS — R079 Chest pain, unspecified: Secondary | ICD-10-CM

## 2012-07-17 DIAGNOSIS — I10 Essential (primary) hypertension: Secondary | ICD-10-CM

## 2012-07-17 NOTE — Progress Notes (Signed)
Patient ID: Jodi Bartlett, female    DOB: 1935-08-07, 77 y.o.   MRN: [id number]  HPI Comments: Jodi Bartlett is a very pleasant 77 year old woman, with long history of GERD, anemia, low vitamin D., insomnia,prior EGD showing moderate size hiatal hernia, recent evaluation by Dr. Kendrick Fries a pulmonary for consolidation in her lung. She presents for symptoms of shortness of breath and chest pain.  Recent consolidation seen on CT scan resolved and followup studies. She did have bronchoscopy, followup chest x-ray and CT. Dramatic improvement of this consolidation. Despite this, she continues to have some shortness of breath and chest pain symptoms.   Her biggest complaint isBurning in her chest at nighttime. She will wake up, sometimes with severe GERD symptoms. This seems to happen on a daily basis.  She does not sleep well secondary to her GI issues. She has chronic fatigue.  Evaluation of her CT scan shows Mild plaquing in her aorta in the aortic arch, descending thoracic aorta. Mild coronary calcification with significant motion artifact. Reports of the CT scan and followup scans were evaluated.  EKG shows normal sinus rhythm with rate 100 beats per minute, no significant ST or T wave changes. Heart rate did improve on clinical exam   Outpatient Encounter Prescriptions as of 07/17/2012  Medication Sig Dispense Refill  . ALPRAZolam (XANAX) 0.25 MG tablet Take 0.25 mg by mouth every 4 (four) hours as needed.        Marland Kitchen amitriptyline (ELAVIL) 100 MG tablet Take 1 tablet (100 mg total) by mouth at bedtime.  30 tablet  11  . bisoprolol (ZEBETA) 5 MG tablet Take 1/2 tablet by mouth daily  45 tablet  3  . buPROPion (WELLBUTRIN SR) 150 MG 12 hr tablet Take 300 mg by mouth daily.       . cholecalciferol (VITAMIN D) 1000 UNITS tablet Take 1,000 Units by mouth 2 (two) times daily.       . cyanocobalamin (,VITAMIN B-12,) 1000 MCG/ML injection Inject 1,000 mcg into the muscle every 30 (thirty) days.       .  pantoprazole (PROTONIX) 40 MG tablet Take 1 tablet (40 mg total) by mouth daily.  30 tablet  11  . triamterene-hydrochlorothiazide (DYAZIDE) 37.5-25 MG per capsule Take 1 each (1 capsule total) by mouth daily.  90 capsule  3  . [DISCONTINUED] chlorpheniramine-HYDROcodone (TUSSIONEX PENNKINETIC ER) 10-8 MG/5ML LQCR Take 5 mLs by mouth at bedtime as needed (cough).  140 mL  1  . [DISCONTINUED] hyoscyamine (LEVSIN SL) 0.125 MG SL tablet Place 1 tablet (0.125 mg total) under the tongue every 4 (four) hours as needed for cramping.  30 tablet  0  . [DISCONTINUED] naproxen sodium (ANAPROX) 220 MG tablet Take 220 mg by mouth as needed.       No facility-administered encounter medications on file as of 07/17/2012.     Review of Systems  Constitutional: Negative.   HENT: Negative.   Eyes: Negative.   Respiratory: Positive for shortness of breath.   Cardiovascular: Positive for chest pain.  Gastrointestinal: Negative.        Severe GERD symptoms, chest pain with nausea, sometimes vomiting  Musculoskeletal: Negative.   Skin: Negative.   Neurological: Negative.   Psychiatric/Behavioral: Negative.   All other systems reviewed and are negative.    BP 122/80  Pulse 100  Ht 5' 3.5" (1.613 m)  Wt 150 lb 4 oz (68.153 kg)  BMI 26.19 kg/m2  Physical Exam  Nursing note and vitals reviewed. Constitutional: She  is oriented to person, place, and time. She appears well-developed and well-nourished.  HENT:  Head: Normocephalic.  Nose: Nose normal.  Mouth/Throat: Oropharynx is clear and moist.  Eyes: Conjunctivae are normal. Pupils are equal, round, and reactive to light.  Neck: Normal range of motion. Neck supple. No JVD present.  Cardiovascular: Normal rate, regular rhythm, S1 normal, S2 normal, normal heart sounds and intact distal pulses.  Exam reveals no gallop and no friction rub.   No murmur heard. Pulmonary/Chest: Effort normal and breath sounds normal. No respiratory distress. She has no  wheezes. She has no rales. She exhibits no tenderness.  Abdominal: Soft. Bowel sounds are normal. She exhibits no distension. There is no tenderness.  Musculoskeletal: Normal range of motion. She exhibits no edema and no tenderness.  Lymphadenopathy:    She has no cervical adenopathy.  Neurological: She is alert and oriented to person, place, and time. Coordination normal.  Skin: Skin is warm and dry. No rash noted. No erythema.  Psychiatric: She has a normal mood and affect. Her behavior is normal. Judgment and thought content normal.    Assessment and Plan

## 2012-07-17 NOTE — Assessment & Plan Note (Signed)
Significant GERD symptoms. We have suggested she add Pepcid at nighttime for symptoms. Also put the head of her bed frame on a block to prevent worsening reflux. She uses several pillows which may make her symptoms worse.

## 2012-07-17 NOTE — Assessment & Plan Note (Signed)
Blood pressure is well controlled on today's visit. No changes made to the medications. 

## 2012-07-17 NOTE — Assessment & Plan Note (Signed)
She reports poor sleep on a chronic basis. Uncertain if this is from severe GERD symptoms at nighttime or other issues. Likely contributing to her fatigue

## 2012-07-17 NOTE — Assessment & Plan Note (Signed)
No lab work since last July. Prior anemia on a chronic basis. This and her insomnia could be contributing to her fatigue

## 2012-07-17 NOTE — Patient Instructions (Addendum)
If you have palpitations, take extra 1/2 dose of bisoprolol  Raise the head of your bed for heart burn relief Try pepcid for heartburn at night  Please hold the bisoprolol the morning of the stress test No food the morning of the stress test No caffeine for 24 hours before the stress test  Please call us if you have new issues that need to be addressed before your next appt.

## 2012-07-17 NOTE — Assessment & Plan Note (Signed)
CT scan showing coronary calcification consistent with CAD. Chest pain symptoms that have been severe. Some atypical features as they occur at rest also raising the concern of worsening GERD. Prior EGD showed a moderate hiatal hernia. Stress test will be ordered next week to rule out ischemia.

## 2012-07-22 ENCOUNTER — Ambulatory Visit: Payer: Self-pay | Admitting: Cardiovascular Disease

## 2012-07-22 DIAGNOSIS — R079 Chest pain, unspecified: Secondary | ICD-10-CM

## 2012-08-21 ENCOUNTER — Telehealth: Payer: Self-pay

## 2012-08-21 NOTE — Telephone Encounter (Signed)
Pt would like test results, states she had a stress test and echo.

## 2012-08-21 NOTE — Telephone Encounter (Signed)
lmtcb

## 2012-08-27 NOTE — Telephone Encounter (Signed)
lmtcb

## 2012-09-23 ENCOUNTER — Other Ambulatory Visit: Payer: Self-pay | Admitting: *Deleted

## 2012-09-23 MED ORDER — BISOPROLOL FUMARATE 5 MG PO TABS: ORAL_TABLET | ORAL | Status: DC

## 2012-10-30 ENCOUNTER — Other Ambulatory Visit: Payer: Self-pay | Admitting: Family Medicine

## 2012-10-30 MED ORDER — TRIAMTERENE-HCTZ 37.5-25 MG PO CAPS: 1.0000 | ORAL_CAPSULE | Freq: Every day | ORAL | Status: DC

## 2012-10-30 MED ORDER — PANTOPRAZOLE SODIUM 40 MG PO TBEC: 40.0000 mg | DELAYED_RELEASE_TABLET | Freq: Every day | ORAL | Status: DC

## 2012-11-16 ENCOUNTER — Telehealth: Payer: Self-pay

## 2012-11-16 NOTE — Telephone Encounter (Signed)
[employer] left v/m amitriptyline approved 11/13/12 -11/13/13. Approval letter to follow. [facility] said rx went thru.

## 2012-12-03 ENCOUNTER — Telehealth: Payer: Self-pay | Admitting: Family Medicine

## 2012-12-03 ENCOUNTER — Encounter: Payer: Self-pay | Admitting: Internal Medicine

## 2012-12-03 ENCOUNTER — Ambulatory Visit (INDEPENDENT_AMBULATORY_CARE_PROVIDER_SITE_OTHER): Payer: Medicare Other | Admitting: Internal Medicine

## 2012-12-03 VITALS — BP 120/70 | HR 82 | Temp 97.9°F | Wt 146.0 lb

## 2012-12-03 DIAGNOSIS — S335XXA Sprain of ligaments of lumbar spine, initial encounter: Secondary | ICD-10-CM | POA: Insufficient documentation

## 2012-12-03 MED ORDER — HYDROCODONE-ACETAMINOPHEN 5-325 MG PO TABS: 0.5000 | ORAL_TABLET | Freq: Every evening | ORAL | Status: DC | PRN

## 2012-12-03 NOTE — Telephone Encounter (Signed)
Patient Information:  Caller Name: Chrishauna  Phone: [phone]  Patient: Cailah, Reach  Gender: Female  DOB: [birth year]  Age: 77 Years  PCP: [facility] [employer] (Family Practice)  Office Follow Up:  Does the office need to follow up with this patient?: No  Instructions For The Office: N/A   Symptoms  Reason For Call & Symptoms: Pt calling that 12/01/12 she turned and had a pain in her back, in the lumbar area and more on the right there.  Pt rates pain as an 8.  She took Aleve  at 0930 this AM and it did not help.  Reviewed Health History In EMR: No  Reviewed Medications In EMR: No  Reviewed Allergies In EMR: Yes  Reviewed Surgeries / Procedures: Yes  Date of Onset of Symptoms: 12/01/2012  Treatments Tried: Aleve  Treatments Tried Worked: No  Guideline(s) Used:  Back Injury  Back Pain  Disposition Per Guideline:   Go to Office Now  Reason For Disposition Reached:   Severe back pain  Advice Given:  Activity  Keep doing your day-to-day activities if it is not too painful. Staying active is better than resting.  Avoid anything that makes your pain worse. Avoid heavy lifting, twisting, and too much exercise until your back heals.  Sleep:  Sleep on your side with a pillow between your knees. If you sleep on your back, put a pillow under your knees.  Avoid sleeping on your stomach.  Your mattress should be firm. Avoid waterbeds.  Pain Medicines:  For pain relief, take acetaminophen, ibuprofen, or naproxen.  Use the lowest amount of medicine that makes your pain feel better.  Patient Will Follow Care Advice:  YES  Appointment Scheduled:  12/03/2012 13:45:00 Appointment Scheduled Provider:  Tillman Abide [facility])

## 2012-12-03 NOTE — Progress Notes (Signed)
Subjective:    Patient ID: Jodi Bartlett, female    DOB: Jul 10, 1935, 77 y.o.   MRN: [id number]  HPI 2 days ago, was working in her flowers Twisted and noted immediate pain across both sides in low back Had to stop the work Didn't treat in any way  Really worsened upon awakening today Worse with deep breath Some limitation in walk and movement---causes pain No radiation  Tried 1 aleve--this AM and 2 yesterday. Not clearly helpful No heat or cold--not sure which to do  Current Outpatient Prescriptions on File Prior to Visit  Medication Sig Dispense Refill  . ALPRAZolam (XANAX) 0.25 MG tablet Take 0.25 mg by mouth every 4 (four) hours as needed.        Marland Kitchen amitriptyline (ELAVIL) 100 MG tablet Take 1 tablet (100 mg total) by mouth at bedtime.  30 tablet  11  . bisoprolol (ZEBETA) 5 MG tablet Take 1/2 tablet by mouth daily  45 tablet  0  . buPROPion (WELLBUTRIN SR) 150 MG 12 hr tablet Take 300 mg by mouth daily.       . cholecalciferol (VITAMIN D) 1000 UNITS tablet Take 1,000 Units by mouth 2 (two) times daily.       . cyanocobalamin (,VITAMIN B-12,) 1000 MCG/ML injection Inject 1,000 mcg into the muscle every 30 (thirty) days.       . pantoprazole (PROTONIX) 40 MG tablet Take 1 tablet (40 mg total) by mouth daily.  30 tablet  2  . triamterene-hydrochlorothiazide (DYAZIDE) 37.5-25 MG per capsule Take 1 each (1 capsule total) by mouth daily.  90 capsule  2   No current facility-administered medications on file prior to visit.    No Known Allergies  Past Medical History  Diagnosis Date  . Anxiety   . Depressed   . GERD (gastroesophageal reflux disease)   . HTN (hypertension)   . Back pain   . OA (ocular albinism)   . Dizziness     vertigo  . Anemia     hx of  . Arthritis   . Interstitial cystitis   . IBS (irritable bowel syndrome)   . UTI (lower urinary tract infection)     hx of    . Stricture and stenosis of esophagus 2009    EGD   . Hx of colonic polyps 2010   Colonoscopy  . Diverticulosis 2010    Colonoscopy  . Ovarian cancer   . Hiatal hernia     EGD   . Allergy   . Blood transfusion without reported diagnosis     Past Surgical History  Procedure Laterality Date  . Hysterectomy (other)      age 61; ovarian cancer (1962)  . Cholecystectomy  1994  . Right shoulder surgery    . Left knee surgery    . Incisional hernia repair      after ccy  . Carpal tunnel release-right    . Breast biopsy- benign    . Bunion/morton's neuroma  4/07    right foot   . Colonoscopy      very difficul- supp with BE past  . Colitis transfusion      hosp at 25  . Hemorrhoid surgery    . Egd tight ues sphincter  7/09    with dialtion  . Foot surgery  10/09    TMT fusion and bunionectomy  . Ct head (other)  6/10    small vessel chronic change/ no acute findings  . Stress cardiolite  9/10  .  Appendectomy    . Left shoulder replacement    . Video bronchoscopy  04/22/2012    Procedure: VIDEO BRONCHOSCOPY WITH FLUORO;  Surgeon: Lupita Leash, MD;  Location: [facility];  Service: Cardiopulmonary;  Laterality: Bilateral;    Family History  Problem Relation Age of Onset  . Coronary artery disease Father   . Hypertension Father   . Heart disease Father     CAD  . Hypertension Sister   . Hyperlipidemia Sister   . Stroke Mother   . Colon cancer Neg Hx   . Esophageal cancer Neg Hx   . Rectal cancer Neg Hx   . Stomach cancer Neg Hx     History   Social History  . Marital Status: Widowed    Spouse Name: N/A    Number of Children: 1  . Years of Education: N/A   Occupational History  . COSMETOLOGIST     Retired    Social History Main Topics  . Smoking status: Never Smoker   . Smokeless tobacco: Never Used  . Alcohol Use: Yes     Comment: social wine   . Drug Use: No  . Sexual Activity: Not on file   Other Topics Concern  . Not on file   Social History Narrative   1 daughter. 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    Daily caffeine     Review of Systems No leg weakness No incontinence or bladder changes No change in bowels No dysuria or other urinary symptoms    Objective:   Physical Exam  Constitutional: She appears well-developed and well-nourished. No distress.  Musculoskeletal:  Mild tenderness along the lumbar paraspinals bilaterally No distinct spine tenderness SLR essentially normal (slight pain on right with full flexion of left hip) Normal ROM of both hips  Neurological:  Slow but normal gait No focal leg weakness          Assessment & Plan:

## 2012-12-03 NOTE — Assessment & Plan Note (Signed)
Nothing to suggest radicular component Discussed heat, low dose aleve Will give hydrocodone for night

## 2012-12-03 NOTE — Telephone Encounter (Signed)
Patient Information:  Caller Name: Tanicia  Phone: [phone]  Patient: Renley, Gutman  Gender: Female  DOB: 28-Dec-1935  Age: 77 Years  PCP: [facility], [place] (Family Practice)  Office Follow Up:  Does the office need to follow up with this patient?: No  Instructions For The Office: N/A   Symptoms  Reason For Call & Symptoms: Pt calling that 12/01/12 she turned and had a pain in her back, in the lumbar area and more on the right side.  Pt rates pain as an 8.  She took Aleve  at 0930 this AM and it did not help.  Reviewed Health History In EMR: No  Reviewed Medications In EMR: No  Reviewed Allergies In EMR: Yes  Reviewed Surgeries / Procedures: Yes  Date of Onset of Symptoms: 12/01/2012  Treatments Tried: Aleve  Treatments Tried Worked: No  Guideline(s) Used:  Back Injury  Back Pain  Disposition Per Guideline:   Go to Office Now  Reason For Disposition Reached:   Severe back pain  Advice Given:  Activity  Keep doing your day-to-day activities if it is not too painful. Staying active is better than resting.  Avoid anything that makes your pain worse. Avoid heavy lifting, twisting, and too much exercise until your back heals.  Sleep:  Sleep on your side with a pillow between your knees. If you sleep on your back, put a pillow under your knees.  Avoid sleeping on your stomach.  Your mattress should be firm. Avoid waterbeds.  Pain Medicines:  For pain relief, take acetaminophen, ibuprofen, or naproxen.  Use the lowest amount of medicine that makes your pain feel better.  Patient Will Follow Care Advice:  YES  Appointment Scheduled:  12/03/2012 13:45:00 Appointment Scheduled Provider:  Tillman Abide [facility])

## 2012-12-03 NOTE — Patient Instructions (Signed)
Please try heat on your back. You can continue the aleve-- 1 twice a day for the next week or so till your back is better. Use the hydrocodone at night until the pain is better

## 2012-12-03 NOTE — Telephone Encounter (Signed)
Will evaluate at visit 

## 2012-12-18 ENCOUNTER — Other Ambulatory Visit: Payer: Self-pay | Admitting: *Deleted

## 2012-12-18 MED ORDER — BISOPROLOL FUMARATE 5 MG PO TABS: ORAL_TABLET | ORAL | Status: DC

## 2013-01-29 ENCOUNTER — Other Ambulatory Visit: Payer: Self-pay | Admitting: Family Medicine

## 2013-01-29 MED ORDER — PANTOPRAZOLE SODIUM 40 MG PO TBEC: 40.0000 mg | DELAYED_RELEASE_TABLET | Freq: Every day | ORAL | Status: DC

## 2013-03-17 ENCOUNTER — Other Ambulatory Visit: Payer: Self-pay | Admitting: Family Medicine

## 2013-03-17 NOTE — Telephone Encounter (Signed)
Electronic refill request, no recent/future appts. with you, please advise  

## 2013-03-17 NOTE — Telephone Encounter (Signed)
Please schedule f/u in the spring and refil until then

## 2013-03-19 NOTE — Telephone Encounter (Signed)
LM on machine requesting a call back

## 2013-03-22 NOTE — Telephone Encounter (Signed)
Attempted to contact pt. Unable to leave message vm is full

## 2013-03-30 NOTE — Telephone Encounter (Signed)
Left voicemail requesting pt to call office 

## 2013-04-06 NOTE — Telephone Encounter (Signed)
Letter mailed

## 2013-05-06 ENCOUNTER — Other Ambulatory Visit: Payer: Self-pay | Admitting: Family Medicine

## 2013-05-07 NOTE — Telephone Encounter (Signed)
Please schedule a f/u for late spring and refill until then, thanks

## 2013-05-07 NOTE — Telephone Encounter (Signed)
No future/recent appt, please advise

## 2013-05-10 NOTE — Telephone Encounter (Signed)
Left voicemail requesting pt to call office 

## 2013-05-11 ENCOUNTER — Ambulatory Visit: Payer: Self-pay | Admitting: Family Medicine

## 2013-05-12 NOTE — Telephone Encounter (Signed)
appt scheduled and med refilled 

## 2013-05-14 ENCOUNTER — Telehealth: Payer: Self-pay | Admitting: Family Medicine

## 2013-05-14 DIAGNOSIS — S62609A Fracture of unspecified phalanx of unspecified finger, initial encounter for closed fracture: Secondary | ICD-10-CM

## 2013-05-14 DIAGNOSIS — Z8781 Personal history of (healed) traumatic fracture: Secondary | ICD-10-CM | POA: Insufficient documentation

## 2013-05-14 NOTE — Telephone Encounter (Signed)
Message copied by Abner Greenspan on Fri May 14, 2013  7:54 AM ------      Message from: Haynes Bast      Created: Thu May 13, 2013  4:59 PM      Regarding: RE: Ortho Referral       Sorry Just saw your message, YES!       ----- Message -----         From: Abner Greenspan, MD         Sent: 05/13/2013   2:31 PM           To: Haynes Bast      Subject: RE: Ortho Referral                                       Is the diagnosis finger fracture?       ----- Message -----         From: Haynes Bast         Sent: 05/13/2013   1:30 PM           To: Abner Greenspan, MD      Subject: Ortho Referral                                           Dr T, Please put in a Ortho Referral , patient fell and went to [place], She wanted [facility] at first but then changed her mind and I got her in today at [facility]. The Urgent Care notes are in your in box and I will fax them to Ortho. Thanks, marion              ------

## 2013-05-14 NOTE — Telephone Encounter (Signed)
Ref to ortho for finger fx

## 2013-06-10 ENCOUNTER — Other Ambulatory Visit: Payer: Self-pay | Admitting: Family Medicine

## 2013-07-09 ENCOUNTER — Ambulatory Visit (INDEPENDENT_AMBULATORY_CARE_PROVIDER_SITE_OTHER)
Admission: RE | Admit: 2013-07-09 | Discharge: 2013-07-09 | Disposition: A | Discharge status: Home / Self Care | Payer: Medicare HMO | Source: Ambulatory Visit | Attending: Internal Medicine | Admitting: Internal Medicine

## 2013-07-09 ENCOUNTER — Ambulatory Visit (INDEPENDENT_AMBULATORY_CARE_PROVIDER_SITE_OTHER): Payer: Medicare HMO | Admitting: Internal Medicine

## 2013-07-09 ENCOUNTER — Encounter: Payer: Self-pay | Admitting: Internal Medicine

## 2013-07-09 VITALS — BP 110/74 | HR 73 | Temp 98.0°F | Wt 144.5 lb

## 2013-07-09 DIAGNOSIS — R141 Gas pain: Secondary | ICD-10-CM

## 2013-07-09 DIAGNOSIS — R109 Unspecified abdominal pain: Secondary | ICD-10-CM

## 2013-07-09 DIAGNOSIS — R142 Eructation: Secondary | ICD-10-CM

## 2013-07-09 DIAGNOSIS — R140 Abdominal distension (gaseous): Secondary | ICD-10-CM

## 2013-07-09 DIAGNOSIS — R143 Flatulence: Secondary | ICD-10-CM

## 2013-07-09 DIAGNOSIS — K5900 Constipation, unspecified: Secondary | ICD-10-CM

## 2013-07-09 LAB — COMPREHENSIVE METABOLIC PANEL
ALK PHOS: 61 U/L (ref 39–117)
ALT: 20 U/L (ref 0–35)
AST: 22 U/L (ref 0–37)
Albumin: 3.7 g/dL (ref 3.5–5.2)
BILIRUBIN TOTAL: 0.6 mg/dL (ref 0.3–1.2)
BUN: 13 mg/dL (ref 6–23)
CO2: 28 mEq/L (ref 19–32)
CREATININE: 1.2 mg/dL (ref 0.4–1.2)
Calcium: 9.7 mg/dL (ref 8.4–10.5)
Chloride: 105 mEq/L (ref 96–112)
GFR: 47.87 mL/min — ABNORMAL LOW (ref >60.00)
GLUCOSE: 94 mg/dL (ref 70–99)
Potassium: 4.4 mEq/L (ref 3.5–5.1)
SODIUM: 140 meq/L (ref 135–145)
TOTAL PROTEIN: 6.8 g/dL (ref 6.0–8.3)

## 2013-07-09 LAB — CBC
HCT: 30.7 % — ABNORMAL LOW (ref 36.0–46.0)
Hemoglobin: 10.3 g/dL — ABNORMAL LOW (ref 12.0–15.0)
MCHC: 33.7 g/dL (ref 30.0–36.0)
MCV: 92.3 fl (ref 78.0–100.0)
Platelets: 303 10*3/uL (ref 150.0–400.0)
RBC: 3.32 Mil/uL — ABNORMAL LOW (ref 3.87–5.11)
RDW: 13.8 % (ref 11.5–14.6)
WBC: 9.5 10*3/uL (ref 4.5–10.5)

## 2013-07-09 NOTE — Progress Notes (Signed)
Subjective:    Patient ID: Jodi Bartlett, female    DOB: 1935-08-06, 78 y.o.   MRN: [id number]  HPI  Pt presents to the clinic today wit hc/o abdominal cramping and bloating. She reports her last BM was 3 days ago. It was loose. She did have some vomiting 3 days ago but none since.She denies fever, chills or body aches. She has seen blood in her stool but that is normal for her (history of UC) Her last colonoscopy was 2 years ago.  She does have a history of IBS and UC.  Review of Systems      Past Medical History  Diagnosis Date  . Anxiety   . Depressed   . GERD (gastroesophageal reflux disease)   . HTN (hypertension)   . Back pain   . OA (ocular albinism)   . Dizziness     vertigo  . Anemia     hx of  . Arthritis   . Interstitial cystitis   . IBS (irritable bowel syndrome)   . UTI (lower urinary tract infection)     hx of    . Stricture and stenosis of esophagus 2009    EGD   . Hx of colonic polyps 2010    Colonoscopy  . Diverticulosis 2010    Colonoscopy  . Ovarian cancer   . Hiatal hernia     EGD   . Allergy   . Blood transfusion without reported diagnosis     Current Outpatient Prescriptions  Medication Sig Dispense Refill  . ALPRAZolam (XANAX) 0.25 MG tablet Take 0.25 mg by mouth every 4 (four) hours as needed.        Marland Kitchen amitriptyline (ELAVIL) 100 MG tablet Take 1 tablet (100 mg total) by mouth at bedtime.  30 tablet  11  . bisoprolol (ZEBETA) 5 MG tablet TAKE ONE-HALF (1/2) TABLET DAILY  45 tablet  0  . buPROPion (WELLBUTRIN SR) 150 MG 12 hr tablet Take 300 mg by mouth daily.       . cholecalciferol (VITAMIN D) 1000 UNITS tablet Take 1,000 Units by mouth 2 (two) times daily.       . cyanocobalamin (,VITAMIN B-12,) 1000 MCG/ML injection Inject 1,000 mcg into the muscle every 30 (thirty) days.       . pantoprazole (PROTONIX) 40 MG tablet TAKE ONE (1) TABLET EACH DAY  30 tablet  2  . triamterene-hydrochlorothiazide (DYAZIDE) 37.5-25 MG per capsule Take 1 each  (1 capsule total) by mouth daily.  90 capsule  2   No current facility-administered medications for this visit.    No Known Allergies  Family History  Problem Relation Age of Onset  . Coronary artery disease Father   . Hypertension Father   . Heart disease Father     CAD  . Hypertension Sister   . Hyperlipidemia Sister   . Stroke Mother   . Colon cancer Neg Hx   . Esophageal cancer Neg Hx   . Rectal cancer Neg Hx   . Stomach cancer Neg Hx     History   Social History  . Marital Status: Widowed    Spouse Name: N/A    Number of Children: 1  . Years of Education: N/A   Occupational History  . COSMETOLOGIST     Retired    Social History Main Topics  . Smoking status: Never Smoker   . Smokeless tobacco: Never Used  . Alcohol Use: Yes     Comment: social wine   .  Drug Use: No  . Sexual Activity: Not on file   Other Topics Concern  . Not on file   Social History Narrative   1 daughter. 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    Daily caffeine      Constitutional: Denies fever, malaise, fatigue, headache or abrupt weight changes.  Gastrointestinal: Pt reports bloating, abdominal cramping and constipation.  Denies diarrhea or blood in the stool.    No other specific complaints in a complete review of systems (except as listed in HPI above).  Objective:   Physical Exam   BP 110/74  Pulse 73  Temp(Src) 98 F (36.7 C) (Oral)  Wt 144 lb 8 oz (65.545 kg)  SpO2 96% Wt Readings from Last 3 Encounters:  07/09/13 144 lb 8 oz (65.545 kg)  12/03/12 146 lb (66.225 kg)  07/17/12 150 lb 4 oz (68.153 kg)    General: Appears her stated age, well developed, well nourished in NAD. Cardiovascular: Normal rate and rhythm. S1,S2 noted.  No murmur, rubs or gallops noted. No JVD or BLE edema. No carotid bruits noted. Pulmonary/Chest: Normal effort and positive vesicular breath sounds. No respiratory distress. No wheezes, rales or ronchi noted.    Abdomen: Soft and diffusely tender. + rebound tenderness. Hyperactive bowel sounds, no bruits noted. No distention or masses noted. Liver, spleen and kidneys non palpable.   BMET    Component Value Date/Time   NA 137 11/28/2011 1551   K 4.3 11/28/2011 1551   CL 101 11/28/2011 1551   CO2 27 11/28/2011 1551   GLUCOSE 89 11/28/2011 1551   BUN 17 11/28/2011 1551   CREATININE 1.1 11/28/2011 1551   CALCIUM 9.3 11/28/2011 1551   GFRNONAA >60 05/26/2010 0420   GFRAA  Value: >60        The eGFR has been calculated using the MDRD equation. This calculation has not been validated in all clinical situations. eGFR's persistently <60 mL/min signify possible Chronic Kidney Disease. 05/26/2010 0420    Lipid Panel     Component Value Date/Time   CHOL 156 10/23/2011 1457   TRIG 57.0 10/23/2011 1457   HDL 66.60 10/23/2011 1457   CHOLHDL 2 10/23/2011 1457   VLDL 11.4 10/23/2011 1457   LDLCALC 78 10/23/2011 1457    CBC    Component Value Date/Time   WBC 8.9 04/21/2012 1453   RBC 3.42* 04/21/2012 1453   HGB 10.4* 04/21/2012 1453   HCT 31.3* 04/21/2012 1453   PLT 304.0 04/21/2012 1453   MCV 91.6 04/21/2012 1453   MCH 31.1 05/26/2010 0420   MCHC 33.2 04/21/2012 1453   RDW 14.5 04/21/2012 1453   LYMPHSABS 1.9 04/21/2012 1453   MONOABS 0.5 04/21/2012 1453   EOSABS 0.4 04/21/2012 1453   BASOSABS 0.1 04/21/2012 1453    Hgb A1C No results found for this basename: HGBA1C        Assessment & Plan:   Abdominal cramping and bloating:  She is constipated, will have her try mag citrate KUB stat CBC and CMET stat  If no improvement or pain worsens, go to ER immediately

## 2013-07-09 NOTE — Progress Notes (Signed)
Pre visit review using our clinic review tool, if applicable. No additional management support is needed unless otherwise documented below in the visit note. 

## 2013-07-09 NOTE — Patient Instructions (Addendum)

## 2013-08-03 ENCOUNTER — Ambulatory Visit (INDEPENDENT_AMBULATORY_CARE_PROVIDER_SITE_OTHER): Payer: Medicare HMO | Admitting: Family Medicine

## 2013-08-03 ENCOUNTER — Encounter: Payer: Self-pay | Admitting: Family Medicine

## 2013-08-03 ENCOUNTER — Ambulatory Visit: Payer: Medicare Other | Admitting: Family Medicine

## 2013-08-03 VITALS — BP 126/74 | HR 80 | Temp 98.3°F | Ht 62.0 in | Wt 149.0 lb

## 2013-08-03 DIAGNOSIS — I10 Essential (primary) hypertension: Secondary | ICD-10-CM

## 2013-08-03 DIAGNOSIS — D649 Anemia, unspecified: Secondary | ICD-10-CM

## 2013-08-03 DIAGNOSIS — F411 Generalized anxiety disorder: Secondary | ICD-10-CM

## 2013-08-03 DIAGNOSIS — E538 Deficiency of other specified B group vitamins: Secondary | ICD-10-CM

## 2013-08-03 DIAGNOSIS — F329 Major depressive disorder, single episode, unspecified: Secondary | ICD-10-CM

## 2013-08-03 DIAGNOSIS — E559 Vitamin D deficiency, unspecified: Secondary | ICD-10-CM

## 2013-08-03 DIAGNOSIS — Z23 Encounter for immunization: Secondary | ICD-10-CM

## 2013-08-03 DIAGNOSIS — F3289 Other specified depressive episodes: Secondary | ICD-10-CM

## 2013-08-03 LAB — LIPID PANEL
CHOL/HDL RATIO: 3
Cholesterol: 176 mg/dL (ref 0–200)
HDL: 55.6 mg/dL (ref >39.00)
LDL Cholesterol: 105 mg/dL — ABNORMAL HIGH (ref 0–99)
Triglycerides: 78 mg/dL (ref 0.0–149.0)
VLDL: 15.6 mg/dL (ref 0.0–40.0)

## 2013-08-03 LAB — VITAMIN B12: VITAMIN B 12: 1131 pg/mL — AB (ref 211–911)

## 2013-08-03 LAB — FERRITIN: FERRITIN: 8.4 ng/mL — AB (ref 10.0–291.0)

## 2013-08-03 LAB — IRON: Iron: 95 ug/dL (ref 42–145)

## 2013-08-03 MED ORDER — TRIAMTERENE-HCTZ 37.5-25 MG PO CAPS: 1.0000 | ORAL_CAPSULE | Freq: Every day | ORAL | Status: DC

## 2013-08-03 MED ORDER — BISOPROLOL FUMARATE 5 MG PO TABS: ORAL_TABLET | ORAL | Status: DC

## 2013-08-03 MED ORDER — PANTOPRAZOLE SODIUM 40 MG PO TBEC: DELAYED_RELEASE_TABLET | ORAL | Status: DC

## 2013-08-03 NOTE — Progress Notes (Signed)
Subjective:    Patient ID: Jodi Bartlett, female    DOB: [birth year], 78 y.o.   MRN: [id number]  HPI Is here for f/u of chronic health problems   Doing ok overall   Was in to see Webb Silversmith recently with constipation- it was miserable  She used mag citrate - and it worked well  She has not had bad constipation in a while   Last colonosc was in 2013  She still has intermittent epigastric pain - GI treats along with acid reflux  Takes protonix for that 40 mg daily Watches her diet pretty carefully  Still anemic Lab Results  Component Value Date   WBC 9.5 07/09/2013   HGB 10.3* 07/09/2013   HCT 30.7* 07/09/2013   MCV 92.3 07/09/2013   PLT 303.0 07/09/2013   not taking any iron for her anemia  She takes her B vitamins and eats very balanced meals also  She has seen GI This has been stable for the past year  She occ sees blood in stool - more often stool is light brown  She has had GI problems ever since her ovarian cancer surgery (hysterectomy)  bp is stable today  No cp or palpitations or headaches or edema  No side effects to medicines  BP Readings from Last 3 Encounters:  08/03/13 126/74  07/09/13 110/74  12/03/12 120/70       Chemistry      Component Value Date/Time   NA 140 07/09/2013 1158   K 4.4 07/09/2013 1158   CL 105 07/09/2013 1158   CO2 28 07/09/2013 1158   BUN 13 07/09/2013 1158   CREATININE 1.2 07/09/2013 1158      Component Value Date/Time   CALCIUM 9.7 07/09/2013 1158   ALKPHOS 61 07/09/2013 1158   AST 22 07/09/2013 1158   ALT 20 07/09/2013 1158   BILITOT 0.6 07/09/2013 1158      Mood has been ok for the most part- stable  She is very very active -and gets lonely at times  Wishes she had a partner to go dancing with  Sees psychiatry and is overall stable   Patient Active Problem List   Diagnosis Date Noted  . Finger fracture 05/14/2013  . Lumbar sprain 12/03/2012  . Insomnia 07/17/2012  . Chest pain 06/23/2012  . Lung mass 04/14/2012  . Cough  03/02/2012  . Breast pain, right 03/02/2012  . Hand pain, right 03/02/2012  . Lung abnormality 01/21/2012  . Anemia 11/13/2011  . Weight loss 11/06/2011  . Head injury, closed 11/06/2011  . DIVERTICULOSIS-COLON 05/29/2009  . ULCERATIVE COLITIS, HX OF 11/10/2008  . UNSPECIFIED VITAMIN D DEFICIENCY 10/11/2008  . VITAMIN B12 DEFICIENCY 10/04/2008  . BUNION 10/19/2007  . ESOPHAGEAL STRICTURE 10/13/2007  . IBS 10/01/2007  . ANXIETY 07/31/2007  . DEPRESSION 07/31/2007  . HYPERTENSION 07/31/2007  . GERD 07/31/2007  . BACK PAIN 07/31/2007  . COLONIC POLYPS, HX OF 07/31/2007   Past Medical History  Diagnosis Date  . Anxiety   . Depressed   . GERD (gastroesophageal reflux disease)   . HTN (hypertension)   . Back pain   . OA (ocular albinism)   . Dizziness     vertigo  . Anemia     hx of  . Arthritis   . Interstitial cystitis   . IBS (irritable bowel syndrome)   . UTI (lower urinary tract infection)     hx of    . Stricture and stenosis of esophagus 2009  EGD   . Hx of colonic polyps 2010    Colonoscopy  . Diverticulosis 2010    Colonoscopy  . Ovarian cancer   . Hiatal hernia     EGD   . Allergy   . Blood transfusion without reported diagnosis    Past Surgical History  Procedure Laterality Date  . Hysterectomy (other)      age 53; ovarian cancer (1962)  . Cholecystectomy  1994  . Right shoulder surgery    . Left knee surgery    . Incisional hernia repair      after ccy  . Carpal tunnel release-right    . Breast biopsy- benign    . Bunion/morton's neuroma  4/07    right foot   . Colonoscopy      very difficul- supp with BE past  . Colitis transfusion      hosp at 25  . Hemorrhoid surgery    . Egd tight ues sphincter  7/09    with dialtion  . Foot surgery  10/09    TMT fusion and bunionectomy  . Ct head (other)  6/10    small vessel chronic change/ no acute findings  . Stress cardiolite  9/10  . Appendectomy    . Left shoulder replacement    . Video  bronchoscopy  04/22/2012    Procedure: VIDEO BRONCHOSCOPY WITH FLUORO;  Surgeon: Juanito Doom, MD;  Location: [facility];  Service: Cardiopulmonary;  Laterality: Bilateral;   History  Substance Use Topics  . Smoking status: Never Smoker   . Smokeless tobacco: Never Used  . Alcohol Use: Yes     Comment: social wine    Family History  Problem Relation Age of Onset  . Coronary artery disease Father   . Hypertension Father   . Heart disease Father     CAD  . Hypertension Sister   . Hyperlipidemia Sister   . Stroke Mother   . Colon cancer Neg Hx   . Esophageal cancer Neg Hx   . Rectal cancer Neg Hx   . Stomach cancer Neg Hx    No Known Allergies Current Outpatient Prescriptions on File Prior to Visit  Medication Sig Dispense Refill  . ALPRAZolam (XANAX) 0.25 MG tablet Take 0.25 mg by mouth every 4 (four) hours as needed.        Marland Kitchen amitriptyline (ELAVIL) 100 MG tablet Take 1 tablet (100 mg total) by mouth at bedtime.  30 tablet  11  . buPROPion (WELLBUTRIN SR) 150 MG 12 hr tablet Take 300 mg by mouth daily.       . cholecalciferol (VITAMIN D) 1000 UNITS tablet Take 1,000 Units by mouth 2 (two) times daily.       . cyanocobalamin (,VITAMIN B-12,) 1000 MCG/ML injection Inject 1,000 mcg into the muscle every 30 (thirty) days.        No current facility-administered medications on file prior to visit.    Review of Systems    Review of Systems  Constitutional: Negative for fever, appetite change, and unexpected weight change.  Eyes: Negative for pain and visual disturbance.  Respiratory: Negative for cough and shortness of breath.   Cardiovascular: Negative for cp or palpitations    Gastrointestinal: Negative for nausea, diarrhea and constipation. pos for occ gerd and epigastric discomfort that is chronic and has been worked up  Genitourinary: Negative for urgency and frequency.  Skin: Negative for pallor or rash   Neurological: Negative for weakness, light-headedness, numbness  and headaches. Pos  for gradually dec balance with age and one fall w/o injury  Hematological: Negative for adenopathy. Does not bruise/bleed easily.  Psychiatric/Behavioral: Negative for dysphoric mood. The patient is not nervous/anxious.  (anx and depression are currently stable)    Objective:   Physical Exam  Constitutional: She appears well-developed and well-nourished. No distress.  Appears younger than stated age  HENT:  Head: Normocephalic and atraumatic.  Mouth/Throat: Oropharynx is clear and moist.  Eyes: Conjunctivae and EOM are normal. Pupils are equal, round, and reactive to light. No scleral icterus.  Neck: Normal range of motion. Neck supple. No JVD present. Carotid bruit is not present. No thyromegaly present.  Cardiovascular: Normal rate, regular rhythm, normal heart sounds and intact distal pulses.  Exam reveals no gallop.   No murmur heard. Pulmonary/Chest: Effort normal and breath sounds normal. No respiratory distress. She has no wheezes. She has no rales.  Abdominal: Soft. Bowel sounds are normal. She exhibits no distension and no abdominal bruit. There is no tenderness.  Musculoskeletal: She exhibits no edema and no tenderness.  Lymphadenopathy:    She has no cervical adenopathy.  Neurological: She is alert. She has normal reflexes. No cranial nerve deficit. She exhibits normal muscle tone. Coordination normal.  No tremor   Skin: Skin is warm and dry. No rash noted. No erythema. No pallor.  Psychiatric: She has a normal mood and affect.          Assessment & Plan:

## 2013-08-03 NOTE — Progress Notes (Signed)
Pre visit review using our clinic review tool, if applicable. No additional management support is needed unless otherwise documented below in the visit note. 

## 2013-08-03 NOTE — Patient Instructions (Addendum)
Labs today for B12 and D level and iron and cholesterol  Take care of yourself Eat regular meals  See GI if symptoms reflux symptoms worsen of if you have trouble swallowing  Pneumonia vaccine today  Schedule an annual exam in about 6 months with labs prior  Don't forget to schedule a mammogram  If you are interested in a shingles/zoster vaccine - call your insurance to check on coverage,( you should not get it within 1 month of other vaccines) , then call us for a prescription  for it to take to a pharmacy that gives the shot , or make a nurse visit to get it here depending on your coverage        Fall Prevention and Home Safety Falls cause injuries and can affect all age groups. It is possible to use preventive measures to significantly decrease the likelihood of falls. There are many simple measures which can make your home safer and prevent falls. OUTDOORS  Repair cracks and edges of walkways and driveways.  Remove high doorway thresholds.  Trim shrubbery on the main path into your home.  Have good outside lighting.  Clear walkways of tools, rocks, debris, and clutter.  Check that handrails are not broken and are securely fastened. Both sides of steps should have handrails.  Have leaves, snow, and ice cleared regularly.  Use sand or salt on walkways during winter months.  In the garage, clean up grease or oil spills. BATHROOM  Install night lights.  Install grab bars by the toilet and in the tub and shower.  Use non-skid mats or decals in the tub or shower.  Place a plastic non-slip stool in the shower to sit on, if needed.  Keep floors dry and clean up all water on the floor immediately.  Remove soap buildup in the tub or shower on a regular basis.  Secure bath mats with non-slip, double-sided rug tape.  Remove throw rugs and tripping hazards from the floors. BEDROOMS  Install night lights.  Make sure a bedside light is easy to reach.  Do not use  oversized bedding.  Keep a telephone by your bedside.  Have a firm chair with side arms to use for getting dressed.  Remove throw rugs and tripping hazards from the floor. KITCHEN  Keep handles on pots and pans turned toward the center of the stove. Use back burners when possible.  Clean up spills quickly and allow time for drying.  Avoid walking on wet floors.  Avoid hot utensils and knives.  Position shelves so they are not too high or low.  Place commonly used objects within easy reach.  If necessary, use a sturdy step stool with a grab bar when reaching.  Keep electrical cables out of the way.  Do not use floor polish or wax that makes floors slippery. If you must use wax, use non-skid floor wax.  Remove throw rugs and tripping hazards from the floor. STAIRWAYS  Never leave objects on stairs.  Place handrails on both sides of stairways and use them. Fix any loose handrails. Make sure handrails on both sides of the stairways are as long as the stairs.  Check carpeting to make sure it is firmly attached along stairs. Make repairs to worn or loose carpet promptly.  Avoid placing throw rugs at the top or bottom of stairways, or properly secure the rug with carpet tape to prevent slippage. Get rid of throw rugs, if possible.  Have an electrician put in a light  switch at the top and bottom of the stairs. OTHER FALL PREVENTION TIPS  Wear low-heel or rubber-soled shoes that are supportive and fit well. Wear closed toe shoes.  When using a stepladder, make sure it is fully opened and both spreaders are firmly locked. Do not climb a closed stepladder.  Add color or contrast paint or tape to grab bars and handrails in your home. Place contrasting color strips on first and last steps.  Learn and use mobility aids as needed. Install an electrical emergency response system.  Turn on lights to avoid dark areas. Replace light bulbs that burn out immediately. Get light switches  that glow.  Arrange furniture to create clear pathways. Keep furniture in the same place.  Firmly attach carpet with non-skid or double-sided tape.  Eliminate uneven floor surfaces.  Select a carpet pattern that does not visually hide the edge of steps.  Be aware of all pets. OTHER HOME SAFETY TIPS  Set the water temperature for 120 F (48.8 C).  Keep emergency numbers on or near the telephone.  Keep smoke detectors on every level of the home and near sleeping areas. Document Released: 03/08/2002 Document Revised: 09/17/2011 Document Reviewed: 06/07/2011 [employer] Patient Information 2014 [place].

## 2013-08-04 ENCOUNTER — Telehealth: Payer: Self-pay | Admitting: Family Medicine

## 2013-08-04 LAB — VITAMIN D 25 HYDROXY (VIT D DEFICIENCY, FRACTURES): VIT D 25 HYDROXY: 54 ng/mL (ref 30–89)

## 2013-08-04 NOTE — Assessment & Plan Note (Signed)
Stable Hb of 10.3 Has seen GI Had stopped her B12 shots  B12 level/ ferritin and iron levels today Some fatigue

## 2013-08-04 NOTE — Telephone Encounter (Signed)
Relevant patient education assigned to patient using Emmi. ° °

## 2013-08-04 NOTE — Assessment & Plan Note (Signed)
bp in fair control at this time  BP Readings from Last 1 Encounters:  08/03/13 126/74   No changes needed Disc lifstyle change with low sodium diet and exercise

## 2013-08-04 NOTE — Assessment & Plan Note (Signed)
Level today No longer takes shots  Remains anemic

## 2013-08-04 NOTE — Assessment & Plan Note (Signed)
D level today  Disc imp to bone and overall health   

## 2013-12-09 ENCOUNTER — Encounter: Payer: Self-pay | Admitting: Internal Medicine

## 2013-12-31 ENCOUNTER — Encounter: Payer: Self-pay | Admitting: Internal Medicine

## 2014-03-16 ENCOUNTER — Ambulatory Visit (INDEPENDENT_AMBULATORY_CARE_PROVIDER_SITE_OTHER)
Admission: RE | Admit: 2014-03-16 | Discharge: 2014-03-16 | Disposition: A | Discharge status: Home / Self Care | Payer: Commercial Managed Care - HMO | Source: Ambulatory Visit | Attending: Family Medicine | Admitting: Family Medicine

## 2014-03-16 ENCOUNTER — Encounter: Payer: Self-pay | Admitting: Family Medicine

## 2014-03-16 ENCOUNTER — Ambulatory Visit (INDEPENDENT_AMBULATORY_CARE_PROVIDER_SITE_OTHER): Payer: Commercial Managed Care - HMO | Admitting: Family Medicine

## 2014-03-16 VITALS — BP 142/78 | HR 68 | Temp 98.3°F | Ht 62.0 in | Wt 149.5 lb

## 2014-03-16 DIAGNOSIS — K589 Irritable bowel syndrome without diarrhea: Secondary | ICD-10-CM

## 2014-03-16 DIAGNOSIS — M545 Low back pain, unspecified: Secondary | ICD-10-CM | POA: Insufficient documentation

## 2014-03-16 DIAGNOSIS — Z23 Encounter for immunization: Secondary | ICD-10-CM

## 2014-03-16 NOTE — Assessment & Plan Note (Signed)
Lumbar pain -on and off for 2 y-worse lately (improved today)  No neuro s/s  Pain worst on L flex  Xray today  Uses aleve prn  Disc use of heat Info given on back pain /strain and prev of injury

## 2014-03-16 NOTE — Assessment & Plan Note (Signed)
Pt is having more bouts and more hard stools  utd colonosc 2013- reviewed  If symptoms persist she will f/u with GI or call for referral

## 2014-03-16 NOTE — Patient Instructions (Signed)
Xray of back today  Will make a plan based on results  If your bowel symptoms worsen-we will get you a GI appt , just let me know   Use heat on your back whenever you can

## 2014-03-16 NOTE — Progress Notes (Signed)
Subjective:    Patient ID: Jodi Bartlett, female    DOB: 07/24/1935, 78 y.o.   MRN: [id number]  HPI Here for back pain   On and off for 2 years  Worse the past several 2 weeks - was almost severe last week / thought about going to ER  Low back  Both sides  occ goes back and forth  Dull pain  Does not shoot down either leg  No comfortable position -she just keeps moving  No particular triggers   Aleve helps some   No urinary symptoms  No neurologic symptoms   More trouble with her IBS lately also  Stools are harder when she has constipation  colonosc 2013 - was ok with tics and nl bx   Lab Results  Component Value Date   WBC 9.5 07/09/2013   HGB 10.3* 07/09/2013   HCT 30.7* 07/09/2013   MCV 92.3 07/09/2013   PLT 303.0 07/09/2013   baseline for her for years   Patient Active Problem List   Diagnosis Date Noted  . H/O finger fracture 05/14/2013  . Lumbar sprain 12/03/2012  . Insomnia 07/17/2012  . Lung mass 04/14/2012  . Lung abnormality 01/21/2012  . Anemia 11/13/2011  . DIVERTICULOSIS-COLON 05/29/2009  . ULCERATIVE COLITIS, HX OF 11/10/2008  . UNSPECIFIED VITAMIN D DEFICIENCY 10/11/2008  . VITAMIN B12 DEFICIENCY 10/04/2008  . BUNION 10/19/2007  . ESOPHAGEAL STRICTURE 10/13/2007  . IBS 10/01/2007  . ANXIETY 07/31/2007  . DEPRESSION 07/31/2007  . HYPERTENSION 07/31/2007  . GERD 07/31/2007  . COLONIC POLYPS, HX OF 07/31/2007   Past Medical History  Diagnosis Date  . Anxiety   . Depressed   . GERD (gastroesophageal reflux disease)   . HTN (hypertension)   . Back pain   . OA (ocular albinism)   . Dizziness     vertigo  . Anemia     hx of  . Arthritis   . Interstitial cystitis   . IBS (irritable bowel syndrome)   . UTI (lower urinary tract infection)     hx of    . Stricture and stenosis of esophagus 2009    EGD   . Hx of colonic polyps 2010    Colonoscopy  . Diverticulosis 2010    Colonoscopy  . Ovarian cancer   . Hiatal hernia     EGD     . Allergy   . Blood transfusion without reported diagnosis    Past Surgical History  Procedure Laterality Date  . Hysterectomy (other)      age 28; ovarian cancer (1962)  . Cholecystectomy  1994  . Right shoulder surgery    . Left knee surgery    . Incisional hernia repair      after ccy  . Carpal tunnel release-right    . Breast biopsy- benign    . Bunion/morton's neuroma  4/07    right foot   . Colonoscopy      very difficul- supp with BE past  . Colitis transfusion      hosp at 25  . Hemorrhoid surgery    . Egd tight ues sphincter  7/09    with dialtion  . Foot surgery  10/09    TMT fusion and bunionectomy  . Ct head (other)  6/10    small vessel chronic change/ no acute findings  . Stress cardiolite  9/10  . Appendectomy    . Left shoulder replacement    . Video bronchoscopy  04/22/2012  Procedure: VIDEO BRONCHOSCOPY WITH FLUORO;  Surgeon: Juanito Doom, MD;  Location: [facility];  Service: Cardiopulmonary;  Laterality: Bilateral;   History  Substance Use Topics  . Smoking status: Never Smoker   . Smokeless tobacco: Never Used  . Alcohol Use: Yes     Comment: social wine    Family History  Problem Relation Age of Onset  . Coronary artery disease Father   . Hypertension Father   . Heart disease Father     CAD  . Hypertension Sister   . Hyperlipidemia Sister   . Stroke Mother   . Colon cancer Neg Hx   . Esophageal cancer Neg Hx   . Rectal cancer Neg Hx   . Stomach cancer Neg Hx    No Known Allergies Current Outpatient Prescriptions on File Prior to Visit  Medication Sig Dispense Refill  . ALPRAZolam (XANAX) 0.25 MG tablet Take 0.25 mg by mouth every 4 (four) hours as needed.      . bisoprolol (ZEBETA) 5 MG tablet TAKE ONE-HALF (1/2) TABLET DAILY 45 tablet 3  . buPROPion (WELLBUTRIN SR) 150 MG 12 hr tablet Take 300 mg by mouth daily.     . cholecalciferol (VITAMIN D) 1000 UNITS tablet Take 1,000 Units by mouth 2 (two) times daily.     .  cyanocobalamin (,VITAMIN B-12,) 1000 MCG/ML injection Inject 1,000 mcg into the muscle every 30 (thirty) days.     . pantoprazole (PROTONIX) 40 MG tablet TAKE ONE (1) TABLET EACH DAY 90 tablet 3  . triamterene-hydrochlorothiazide (DYAZIDE) 37.5-25 MG per capsule Take 1 each (1 capsule total) by mouth daily. 90 capsule 3   No current facility-administered medications on file prior to visit.     Review of Systems Review of Systems  Constitutional: Negative for fever, appetite change, fatigue and unexpected weight change.  Eyes: Negative for pain and visual disturbance.  Respiratory: Negative for cough and shortness of breath.   Cardiovascular: Negative for cp or palpitations    Gastrointestinal: Negative for nausea, pos for intermittent diarrhea and constipation   Genitourinary: Negative for urgency and frequency.  Skin: Negative for pallor or rash   MSK pos for low back pain  Neurological: Negative for weakness, light-headedness, numbness and headaches.  Hematological: Negative for adenopathy. Does not bruise/bleed easily.  Psychiatric/Behavioral: Negative for dysphoric mood. The patient is not nervous/anxious.         Objective:   Physical Exam  Constitutional: She appears well-nourished. No distress.  HENT:  Head: Normocephalic and atraumatic.  Eyes: Conjunctivae and EOM are normal. Pupils are equal, round, and reactive to light.  Neck: Normal range of motion. Neck supple.  Cardiovascular: Normal rate and regular rhythm.   Pulmonary/Chest: Effort normal and breath sounds normal. No respiratory distress. She has no wheezes. She has no rales.  Abdominal: Soft. Bowel sounds are normal. She exhibits no distension and no mass. There is no tenderness. There is no rebound and no guarding.  Musculoskeletal: She exhibits tenderness. She exhibits no edema.       Lumbar back: She exhibits decreased range of motion, tenderness and spasm. She exhibits no bony tenderness, no swelling, no edema  and no deformity.  Pain on L flex lumbar spine Nl flex and ext Nl gait  Tender over L lumbar musculature with some spasm  No scoliosis   Neg SLR Nl rom hips    Neurological: She is alert. She has normal reflexes.  Skin: Skin is warm and dry. No rash noted.  Psychiatric:  She has a normal mood and affect.          Assessment & Plan:   Problem List Items Addressed This Visit      Digestive   IBS    Pt is having more bouts and more hard stools  utd colonosc 2013- reviewed  If symptoms persist she will f/u with GI or call for referral       Other   Low back pain - Primary    Lumbar pain -on and off for 2 y-worse lately (improved today)  No neuro s/s  Pain worst on L flex  Xray today  Uses aleve prn  Disc use of heat Info given on back pain /strain and prev of injury    Relevant Orders      DG Lumbar Spine Complete (Completed)    Other Visit Diagnoses    Need for prophylactic vaccination and inoculation against influenza        Relevant Orders       Flu Vaccine QUAD 36+ mos PF IM (Fluarix Quad PF) (Completed)

## 2014-03-16 NOTE — Progress Notes (Signed)
Pre visit review using our clinic review tool, if applicable. No additional management support is needed unless otherwise documented below in the visit note. 

## 2014-03-17 ENCOUNTER — Telehealth: Payer: Self-pay | Admitting: Family Medicine

## 2014-03-17 DIAGNOSIS — M545 Low back pain, unspecified: Secondary | ICD-10-CM

## 2014-03-17 MED ORDER — CYCLOBENZAPRINE HCL 10 MG PO TABS: 5.0000 mg | ORAL_TABLET | Freq: Three times a day (TID) | ORAL | Status: DC | PRN

## 2014-03-17 NOTE — Telephone Encounter (Signed)
Ref to PT Please call in muscle relaxer- warn of sedation

## 2014-03-17 NOTE — Telephone Encounter (Signed)
-----   Message from Tammi Sou, [place] sent at 03/17/2014  1:31 PM EST ----- Pt notified of xray results and Dr. Marliss Coots comments. Pt does want to try PT but she would need it to be in [place]. Pt also wanted me to ask you if you could prescribe something to help with the back pain she is having

## 2014-03-17 NOTE — Telephone Encounter (Signed)
Rx sent to pharmacy and pt notified  ?

## 2014-06-09 ENCOUNTER — Telehealth: Payer: Self-pay

## 2014-06-09 NOTE — Telephone Encounter (Signed)
Thanks for letting me know  I would like to refer her to orthopedics for back pain-please ask if agreeable

## 2014-06-09 NOTE — Telephone Encounter (Signed)
Pt left v/m; pt finished PT last week and therapist told pt to contact PCP because pt did not make the type progress that therapist was hoping for. Pt wants to know what to do next.

## 2014-06-10 NOTE — Telephone Encounter (Signed)
Left message for patient to call office to advise her of ortho referral is she is ok with it.

## 2014-07-04 ENCOUNTER — Encounter: Payer: Self-pay | Admitting: Family Medicine

## 2014-07-04 ENCOUNTER — Ambulatory Visit (INDEPENDENT_AMBULATORY_CARE_PROVIDER_SITE_OTHER): Payer: Commercial Managed Care - HMO | Admitting: Family Medicine

## 2014-07-04 VITALS — BP 124/70 | HR 72 | Temp 98.0°F | Ht 62.0 in | Wt 152.8 lb

## 2014-07-04 DIAGNOSIS — E538 Deficiency of other specified B group vitamins: Secondary | ICD-10-CM

## 2014-07-04 DIAGNOSIS — I10 Essential (primary) hypertension: Secondary | ICD-10-CM

## 2014-07-04 DIAGNOSIS — M5441 Lumbago with sciatica, right side: Secondary | ICD-10-CM | POA: Diagnosis not present

## 2014-07-04 DIAGNOSIS — D649 Anemia, unspecified: Secondary | ICD-10-CM

## 2014-07-04 DIAGNOSIS — M5442 Lumbago with sciatica, left side: Secondary | ICD-10-CM

## 2014-07-04 MED ORDER — BISOPROLOL FUMARATE 5 MG PO TABS: ORAL_TABLET | ORAL | Status: DC

## 2014-07-04 MED ORDER — TRIAMTERENE-HCTZ 37.5-25 MG PO CAPS: 1.0000 | ORAL_CAPSULE | Freq: Every day | ORAL | Status: DC

## 2014-07-04 NOTE — Assessment & Plan Note (Addendum)
Now rad to both upper legs  Not improved with PT Ref to ortho xr-deg disc/facet dz and also scoliosis

## 2014-07-04 NOTE — Assessment & Plan Note (Signed)
Level on oral suppl today

## 2014-07-04 NOTE — Progress Notes (Signed)
Subjective:    Patient ID: Jodi Bartlett, female    DOB: Aug 22, 1935, 79 y.o.   MRN: [id number]  HPI Here for f/u of chronic medical problems   Has been doing fair  Still a lot of back problems and PT did not seem to do anything  Needs ref to ortho  Radiates to buttocks on both sides  "I just can't do anything" - is frustrated  For instance vacuuming a room    Wt is up 3 lb with bmi of 27  Taking care of herself and eating a healthy diet   Psychiatry changed her medicine for dep/anx - has caused her to gain weight and constipation , but it is helping her mood  Still cannot sleep well  Has f/u upcoming   bp is stable today  No cp or palpitations or headaches or edema  No side effects to medicines  BP Readings from Last 3 Encounters:  07/04/14 124/70  03/16/14 142/78  08/03/13 126/74     On zebeta and dyazide   No longer on B12 shots - and her level was ok on oral supplementation  Has chronic anemia - has had GI work up [employer] 2013)  Is constipated  Cannot tell if there is blood in the stool  Drinks a lot of water   Patient Active Problem List   Diagnosis Date Noted  . Low back pain 03/16/2014  . H/O finger fracture 05/14/2013  . Lumbar sprain 12/03/2012  . Insomnia 07/17/2012  . Lung mass 04/14/2012  . Lung abnormality 01/21/2012  . Anemia 11/13/2011  . DIVERTICULOSIS-COLON 05/29/2009  . ULCERATIVE COLITIS, HX OF 11/10/2008  . UNSPECIFIED VITAMIN D DEFICIENCY 10/11/2008  . B12 deficiency 10/04/2008  . BUNION 10/19/2007  . ESOPHAGEAL STRICTURE 10/13/2007  . IBS 10/01/2007  . ANXIETY 07/31/2007  . DEPRESSION 07/31/2007  . Essential hypertension 07/31/2007  . GERD 07/31/2007  . COLONIC POLYPS, HX OF 07/31/2007   Past Medical History  Diagnosis Date  . Anxiety   . Depressed   . GERD (gastroesophageal reflux disease)   . HTN (hypertension)   . Back pain   . OA (ocular albinism)   . Dizziness     vertigo  . Anemia     hx of  . Arthritis   .  Interstitial cystitis   . IBS (irritable bowel syndrome)   . UTI (lower urinary tract infection)     hx of    . Stricture and stenosis of esophagus 2009    EGD   . Hx of colonic polyps 2010    Colonoscopy  . Diverticulosis 2010    Colonoscopy  . Ovarian cancer   . Hiatal hernia     EGD   . Allergy   . Blood transfusion without reported diagnosis    Past Surgical History  Procedure Laterality Date  . Hysterectomy (other)      age 61; ovarian cancer (1962)  . Cholecystectomy  1994  . Right shoulder surgery    . Left knee surgery    . Incisional hernia repair      after ccy  . Carpal tunnel release-right    . Breast biopsy- benign    . Bunion/morton's neuroma  4/07    right foot   . Colonoscopy      very difficul- supp with BE past  . Colitis transfusion      hosp at 25  . Hemorrhoid surgery    . Egd tight ues sphincter  7/09  with dialtion  . Foot surgery  10/09    TMT fusion and bunionectomy  . Ct head (other)  6/10    small vessel chronic change/ no acute findings  . Stress cardiolite  9/10  . Appendectomy    . Left shoulder replacement    . Video bronchoscopy  04/22/2012    Procedure: VIDEO BRONCHOSCOPY WITH FLUORO;  Surgeon: Juanito Doom, MD;  Location: [facility];  Service: Cardiopulmonary;  Laterality: Bilateral;   History  Substance Use Topics  . Smoking status: Never Smoker   . Smokeless tobacco: Never Used  . Alcohol Use: Yes     Comment: social wine    Family History  Problem Relation Age of Onset  . Coronary artery disease Father   . Hypertension Father   . Heart disease Father     CAD  . Hypertension Sister   . Hyperlipidemia Sister   . Stroke Mother   . Colon cancer Neg Hx   . Esophageal cancer Neg Hx   . Rectal cancer Neg Hx   . Stomach cancer Neg Hx    No Known Allergies Current Outpatient Prescriptions on File Prior to Visit  Medication Sig Dispense Refill  . ALPRAZolam (XANAX) 0.25 MG tablet Take 0.25 mg by mouth every 4  (four) hours as needed.      Marland Kitchen buPROPion (WELLBUTRIN SR) 150 MG 12 hr tablet Take 300 mg by mouth daily.     . cholecalciferol (VITAMIN D) 1000 UNITS tablet Take 1,000 Units by mouth 2 (two) times daily.     . cyanocobalamin (,VITAMIN B-12,) 1000 MCG/ML injection Inject 1,000 mcg into the muscle every 30 (thirty) days.     Marland Kitchen gabapentin (NEURONTIN) 100 MG capsule Take 1-3 capsules by mouth at bedtime for RLS    . mirtazapine (REMERON) 15 MG tablet Take 1 tablet by mouth at bedtime.    . pantoprazole (PROTONIX) 40 MG tablet TAKE ONE (1) TABLET EACH DAY 90 tablet 3   No current facility-administered medications on file prior to visit.    Review of Systems Review of Systems  Constitutional: Negative for fever, appetite change, fatigue and unexpected weight change.  Eyes: Negative for pain and visual disturbance.  Respiratory: Negative for cough and shortness of breath.   Cardiovascular: Negative for cp or palpitations    Gastrointestinal: Negative for nausea, diarrhea and constipation.  Genitourinary: Negative for urgency and frequency.  Skin: Negative for pallor or rash   MSK pos for low back pain rad to buttocks , neg for joint swelling  Neurological: Negative for weakness, light-headedness, numbness and headaches.  Hematological: Negative for adenopathy. Does not bruise/bleed easily.  Psychiatric/Behavioral: Negative for dysphoric mood. The patient is not nervous/anxious.         Objective:   Physical Exam  Constitutional: She appears well-developed and well-nourished. No distress.  overwt and well app  HENT:  Head: Normocephalic and atraumatic.  Mouth/Throat: Oropharynx is clear and moist.  Eyes: Conjunctivae and EOM are normal. Pupils are equal, round, and reactive to light. Right eye exhibits no discharge. Left eye exhibits no discharge. No scleral icterus.  Neck: Normal range of motion. Neck supple. No JVD present. Carotid bruit is not present. No thyromegaly present.    Cardiovascular: Normal rate, regular rhythm, normal heart sounds and intact distal pulses.  Exam reveals no gallop.   Pulmonary/Chest: Effort normal and breath sounds normal. No respiratory distress. She has no wheezes. She has no rales.  Abdominal: Soft. Bowel sounds are normal.  She exhibits no distension, no abdominal bruit and no mass. There is no tenderness.  Musculoskeletal: She exhibits tenderness. She exhibits no edema.  R Lumbar muscular tenderness today Neg SLR  Lymphadenopathy:    She has no cervical adenopathy.  Neurological: She is alert. She has normal reflexes. No cranial nerve deficit. She exhibits normal muscle tone. Coordination normal.  Skin: Skin is warm and dry. No rash noted. No erythema. No pallor.  Psychiatric: She has a normal mood and affect.          Assessment & Plan:   Problem List Items Addressed This Visit      Cardiovascular and Mediastinum   Essential hypertension - Primary    bp in fair control at this time  BP Readings from Last 1 Encounters:  07/04/14 124/70   No changes needed Disc lifstyle change with low sodium diet and exercise  Lab today Refilled zebeta and dyazide       Relevant Medications   triamterene-hydrochlorothiazide (DYAZIDE) 37.5-25 MG per capsule   bisoprolol (ZEBETA) tablet   Other Relevant Orders   CBC with Differential/Platelet   TSH   Comprehensive metabolic panel   Lipid panel     Digestive   B12 deficiency    Level on oral suppl today      Relevant Orders   Vitamin B12     Other   Anemia    Chronic/ongoing Some RLS lately  On B12 oral  Ferritin today with cbc  Had GI w/u in the past       Relevant Orders   Ferritin   Low back pain    Now rad to both upper legs  Not improved with PT Ref to ortho xr-deg disc/facet dz and also scoliosis       Relevant Orders   Ambulatory referral to Orthopedic Surgery

## 2014-07-04 NOTE — Assessment & Plan Note (Signed)
Chronic/ongoing Some RLS lately  On B12 oral  Ferritin today with cbc  Had GI w/u in the past

## 2014-07-04 NOTE — Assessment & Plan Note (Signed)
bp in fair control at this time  BP Readings from Last 1 Encounters:  07/04/14 124/70   No changes needed Disc lifstyle change with low sodium diet and exercise  Lab today Refilled zebeta and dyazide

## 2014-07-04 NOTE — Patient Instructions (Signed)
Labs today  Stop at check out for referral to orthopedics for back pain

## 2014-07-04 NOTE — Progress Notes (Signed)
Pre visit review using our clinic review tool, if applicable. No additional management support is needed unless otherwise documented below in the visit note. 

## 2014-07-05 LAB — COMPREHENSIVE METABOLIC PANEL
ALBUMIN: 4.2 g/dL (ref 3.5–5.2)
ALT: 17 U/L (ref 0–35)
AST: 22 U/L (ref 0–37)
Alkaline Phosphatase: 77 U/L (ref 39–117)
BUN: 24 mg/dL — ABNORMAL HIGH (ref 6–23)
CALCIUM: 10.1 mg/dL (ref 8.4–10.5)
CHLORIDE: 103 meq/L (ref 96–112)
CO2: 28 mEq/L (ref 19–32)
Creatinine, Ser: 1.12 mg/dL (ref 0.40–1.20)
GFR: 50.21 mL/min — ABNORMAL LOW (ref >60.00)
GLUCOSE: 88 mg/dL (ref 70–99)
POTASSIUM: 4.6 meq/L (ref 3.5–5.1)
Sodium: 138 mEq/L (ref 135–145)
Total Bilirubin: 0.3 mg/dL (ref 0.2–1.2)
Total Protein: 7 g/dL (ref 6.0–8.3)

## 2014-07-05 LAB — CBC WITH DIFFERENTIAL/PLATELET
Basophils Absolute: 0.1 10*3/uL (ref 0.0–0.1)
Basophils Relative: 0.9 % (ref 0.0–3.0)
EOS ABS: 0.3 10*3/uL (ref 0.0–0.7)
Eosinophils Relative: 4.5 % (ref 0.0–5.0)
HCT: 31.1 % — ABNORMAL LOW (ref 36.0–46.0)
HEMOGLOBIN: 10.4 g/dL — AB (ref 12.0–15.0)
Lymphocytes Relative: 23.7 % (ref 12.0–46.0)
Lymphs Abs: 1.6 10*3/uL (ref 0.7–4.0)
MCHC: 33.4 g/dL (ref 30.0–36.0)
MCV: 86.5 fl (ref 78.0–100.0)
Monocytes Absolute: 0.5 10*3/uL (ref 0.1–1.0)
Monocytes Relative: 7.3 % (ref 3.0–12.0)
NEUTROS ABS: 4.2 10*3/uL (ref 1.4–7.7)
NEUTROS PCT: 63.6 % (ref 43.0–77.0)
Platelets: 288 10*3/uL (ref 150.0–400.0)
RBC: 3.6 Mil/uL — AB (ref 3.87–5.11)
RDW: 15.7 % — AB (ref 11.5–15.5)
WBC: 6.7 10*3/uL (ref 4.0–10.5)

## 2014-07-05 LAB — LIPID PANEL
CHOLESTEROL: 180 mg/dL (ref 0–200)
HDL: 61.9 mg/dL (ref >39.00)
LDL CALC: 102 mg/dL — AB (ref 0–99)
NONHDL: 118.1
Total CHOL/HDL Ratio: 3
Triglycerides: 82 mg/dL (ref 0.0–149.0)
VLDL: 16.4 mg/dL (ref 0.0–40.0)

## 2014-07-05 LAB — FERRITIN: Ferritin: 4.2 ng/mL — ABNORMAL LOW (ref 10.0–291.0)

## 2014-07-05 LAB — TSH: TSH: 4.07 u[IU]/mL (ref 0.35–4.50)

## 2014-07-05 LAB — VITAMIN B12: Vitamin B-12: 187 pg/mL — ABNORMAL LOW (ref 211–911)

## 2014-07-06 ENCOUNTER — Telehealth: Payer: Self-pay

## 2014-07-06 NOTE — Telephone Encounter (Signed)
-----   Message from Abner Greenspan, MD sent at 07/06/2014  1:41 PM EDT ----- Still iron def anemia - (she has had nl GI w/u)- if not taking iron I would like her to start otc ferrous sulfate approx 325 mg daily (if constipation from this take a stool softener) It is stable however she may feel better if it improved Also B12 is low  Please schedule 4 B12 shots weekly for 4 weeks and then lab in 5 weeks for cbc with diff and B12 level (for anemia and B12 def)  Thanks

## 2014-07-06 NOTE — Telephone Encounter (Signed)
Left message for patient to call back regarding her lab results

## 2014-07-07 NOTE — Telephone Encounter (Signed)
Pt left v/m returning call and request cb [phone].

## 2014-07-13 ENCOUNTER — Ambulatory Visit (INDEPENDENT_AMBULATORY_CARE_PROVIDER_SITE_OTHER): Payer: Commercial Managed Care - HMO | Admitting: *Deleted

## 2014-07-13 DIAGNOSIS — E538 Deficiency of other specified B group vitamins: Secondary | ICD-10-CM | POA: Diagnosis not present

## 2014-07-13 MED ORDER — CYANOCOBALAMIN 1000 MCG/ML IJ SOLN
1000.0000 ug | Freq: Once | INTRAMUSCULAR | Status: AC
  Administered 2014-07-13: 1000 ug via INTRAMUSCULAR

## 2014-07-19 ENCOUNTER — Ambulatory Visit (INDEPENDENT_AMBULATORY_CARE_PROVIDER_SITE_OTHER): Payer: Commercial Managed Care - HMO | Admitting: *Deleted

## 2014-07-19 DIAGNOSIS — D519 Vitamin B12 deficiency anemia, unspecified: Secondary | ICD-10-CM | POA: Diagnosis not present

## 2014-07-19 MED ORDER — CYANOCOBALAMIN 1000 MCG/ML IJ SOLN
1000.0000 ug | Freq: Once | INTRAMUSCULAR | Status: AC
  Administered 2014-07-19: 1000 ug via INTRAMUSCULAR

## 2014-07-19 NOTE — Op Note (Signed)
PATIENT NAME:  Jodi Bartlett, Jodi Bartlett MR#:  [id number] DATE OF BIRTH:  [birth year]  DATE OF PROCEDURE:  12/03/2011  PREOPERATIVE DIAGNOSIS: Visually significant cataract of the left eye.   POSTOPERATIVE DIAGNOSIS: Visually significant cataract of the left eye.   OPERATIVE PROCEDURE: Cataract extraction by phacoemulsification with implant of intraocular lens to left eye.   SURGEON: Birder Robson, MD.   ANESTHESIA:  1. Managed anesthesia care.  2. Topical tetracaine drops followed by 2% Xylocaine jelly applied in the preoperative holding area.   COMPLICATIONS: None.   TECHNIQUE:  Stop and chop.   DESCRIPTION OF PROCEDURE: The patient was examined and consented in the preoperative holding area where the aforementioned topical anesthesia was applied to the left eye and then brought back to the Operating Room where the left eye was prepped and draped in the usual sterile ophthalmic fashion and a lid speculum was placed. A paracentesis was created with the side port blade and the anterior chamber was filled with viscoelastic. A near clear corneal incision was performed with the steel keratome. A continuous curvilinear capsulorrhexis was performed with a cystotome followed by the capsulorrhexis forceps. Hydrodissection and hydrodelineation were carried out with BSS on a blunt cannula. The lens was removed in a stop and chop technique and the remaining cortical material was removed with the irrigation-aspiration handpiece. The capsular bag was inflated with viscoelastic and the Tecnis ZCB00 25.5-diopter lens, serial number [id number] was placed in the capsular bag without complication. The remaining viscoelastic was removed from the eye with the irrigation-aspiration handpiece. The wounds were hydrated. The anterior chamber was flushed with Miostat and the eye was inflated to physiologic pressure. The wounds were found to be water tight. The eye was dressed with Vigamox. The patient was given protective glasses  to wear throughout the day and a shield with which to sleep tonight. The patient was also given drops with which to begin a drop regimen today and will follow-up with me in one day.  ____________________________ Livingston Diones. Taichi Repka, MD wlp:slb D: 12/03/2011 12:26:39 ET T: 12/03/2011 12:45:38 ET JOB#: [id number]  cc: Carolynn Tuley L. Aniket Paye, MD, <Dictator> Livingston Diones Clorine Swing MD ELECTRONICALLY SIGNED 12/04/2011 13:06

## 2014-07-19 NOTE — Op Note (Signed)
PATIENT NAME:  Jodi Bartlett, Jodi Bartlett MR#:  [id number] DATE OF BIRTH:  Nov 03, 1935  DATE OF PROCEDURE:  11/19/2011  PREOPERATIVE DIAGNOSIS: Visually significant cataract of the right eye.   POSTOPERATIVE DIAGNOSIS: Visually significant cataract of the right eye.   OPERATIVE PROCEDURE: Cataract extraction by phacoemulsification with implant of intraocular lens to right eye.   SURGEON: Birder Robson, MD.   ANESTHESIA:  1. Managed anesthesia care.  2. Topical tetracaine drops followed by 2% Xylocaine jelly applied in the preoperative holding area.   COMPLICATIONS: None.   TECHNIQUE:  Stop and chop.   DESCRIPTION OF PROCEDURE: The patient was examined and consented in the preoperative holding area where the aforementioned topical anesthesia was applied to the right eye and then brought back to the Operating Room where the right eye was prepped and draped in the usual sterile ophthalmic fashion and a lid speculum was placed. A paracentesis was created with the side port blade and the anterior chamber was filled with viscoelastic. A near clear corneal incision was performed with the steel keratome. A continuous curvilinear capsulorrhexis was performed with a cystotome followed by the capsulorrhexis forceps. Hydrodissection and hydrodelineation were carried out with BSS on a blunt cannula. The lens was removed in a stop and chop technique and the remaining cortical material was removed with the irrigation-aspiration handpiece. The capsular bag was inflated with viscoelastic and the Tecnis ZCB00 26.0-diopter lens, serial number [id number] was placed in the capsular bag without complication. The remaining viscoelastic was removed from the eye with the irrigation-aspiration handpiece. The wounds were hydrated. The anterior chamber was flushed with Miostat and the eye was inflated to physiologic pressure. The wounds were found to be water tight. The eye was dressed with Vigamox. The patient was given protective  glasses to wear throughout the day and a shield with which to sleep tonight. The patient was also given drops with which to begin a drop regimen today and will follow-up with me in one day.   ____________________________ Livingston Diones. Bob Daversa, MD wlp:slb D: 11/19/2011 12:31:46 ET T: 11/19/2011 12:45:51 ET JOB#: [id number]  cc: Nathanal Hermiz L. Devontay Celaya, MD, <Dictator> Livingston Diones Jin Shockley MD ELECTRONICALLY SIGNED 11/21/2011 17:18

## 2014-07-26 ENCOUNTER — Ambulatory Visit (INDEPENDENT_AMBULATORY_CARE_PROVIDER_SITE_OTHER): Payer: Commercial Managed Care - HMO | Admitting: *Deleted

## 2014-07-26 DIAGNOSIS — D519 Vitamin B12 deficiency anemia, unspecified: Secondary | ICD-10-CM

## 2014-07-26 MED ORDER — CYANOCOBALAMIN 1000 MCG/ML IJ SOLN
1000.0000 ug | Freq: Once | INTRAMUSCULAR | Status: AC
  Administered 2014-07-26: 1000 ug via INTRAMUSCULAR

## 2014-08-02 ENCOUNTER — Ambulatory Visit (INDEPENDENT_AMBULATORY_CARE_PROVIDER_SITE_OTHER): Payer: Commercial Managed Care - HMO | Admitting: *Deleted

## 2014-08-02 DIAGNOSIS — D519 Vitamin B12 deficiency anemia, unspecified: Secondary | ICD-10-CM

## 2014-08-02 MED ORDER — CYANOCOBALAMIN 1000 MCG/ML IJ SOLN
1000.0000 ug | Freq: Once | INTRAMUSCULAR | Status: AC
  Administered 2014-08-02: 1000 ug via INTRAMUSCULAR

## 2014-08-03 ENCOUNTER — Other Ambulatory Visit: Payer: Self-pay | Admitting: Family Medicine

## 2014-08-03 DIAGNOSIS — D518 Other vitamin B12 deficiency anemias: Secondary | ICD-10-CM

## 2014-08-03 DIAGNOSIS — E538 Deficiency of other specified B group vitamins: Secondary | ICD-10-CM

## 2014-08-09 ENCOUNTER — Other Ambulatory Visit: Payer: Commercial Managed Care - HMO

## 2014-08-11 ENCOUNTER — Other Ambulatory Visit (INDEPENDENT_AMBULATORY_CARE_PROVIDER_SITE_OTHER): Payer: Commercial Managed Care - HMO

## 2014-08-11 DIAGNOSIS — E538 Deficiency of other specified B group vitamins: Secondary | ICD-10-CM | POA: Diagnosis not present

## 2014-08-11 DIAGNOSIS — D518 Other vitamin B12 deficiency anemias: Secondary | ICD-10-CM

## 2014-08-11 LAB — CBC WITH DIFFERENTIAL/PLATELET
BASOS ABS: 0 10*3/uL (ref 0.0–0.1)
Basophils Relative: 0.8 % (ref 0.0–3.0)
Eosinophils Absolute: 0.5 10*3/uL (ref 0.0–0.7)
Eosinophils Relative: 8.5 % — ABNORMAL HIGH (ref 0.0–5.0)
HCT: 29.9 % — ABNORMAL LOW (ref 36.0–46.0)
Hemoglobin: 10.2 g/dL — ABNORMAL LOW (ref 12.0–15.0)
LYMPHS ABS: 1.6 10*3/uL (ref 0.7–4.0)
Lymphocytes Relative: 29.1 % (ref 12.0–46.0)
MCHC: 34 g/dL (ref 30.0–36.0)
MCV: 85.3 fl (ref 78.0–100.0)
Monocytes Absolute: 0.5 10*3/uL (ref 0.1–1.0)
Monocytes Relative: 9.6 % (ref 3.0–12.0)
NEUTROS ABS: 2.9 10*3/uL (ref 1.4–7.7)
Neutrophils Relative %: 52 % (ref 43.0–77.0)
Platelets: 298 10*3/uL (ref 150.0–400.0)
RBC: 3.51 Mil/uL — AB (ref 3.87–5.11)
RDW: 16.5 % — AB (ref 11.5–15.5)
WBC: 5.5 10*3/uL (ref 4.0–10.5)

## 2014-08-11 LAB — VITAMIN B12: VITAMIN B 12: 556 pg/mL (ref 211–911)

## 2014-08-15 ENCOUNTER — Encounter: Payer: Self-pay | Admitting: *Deleted

## 2014-08-23 ENCOUNTER — Other Ambulatory Visit: Payer: Self-pay | Admitting: Family Medicine

## 2014-09-14 ENCOUNTER — Telehealth: Payer: Self-pay | Admitting: Family Medicine

## 2014-09-14 NOTE — Telephone Encounter (Incomplete)
Patient Name: Jodi Bartlett DOB: 1935-07-03 Initial Comment Caller States she has had diarrhea since 3am this morning. Nurse Assessment Nurse: Vallery Sa, RN, Cathy Date/Time (Eastern Time): 09/14/2014 1:45:36 PM Confirm and document reason for call. If symptomatic, describe symptoms. ---Caller states she developed diarrhea again about 3 am (6-7 episodes today). No vomiting or fever. Has the patient traveled out of the country within the last 30 days? ---No Does the patient require triage? ---Yes Related visit to physician within the last 2 weeks? ---No Does the PT have any chronic conditions? (i.e. diabetes, asthma, etc.) ---Yes List chronic conditions. ---Intestinal problems, High Blood Pressure Guidelines Guideline Title Affirmed Question Affirmed Notes Diarrhea [1] Age > 60 years AND [2] > 6 diarrhea stools in past 24 hours Final Disposition User See Physician within 4 Hours (or PCP triage) Vallery Sa, RN, Tye [place] Comments ***CALLBACK REQUESTED*** [name] declined to see the MD and asks to get a message to MD or MD's Nurse to see if they have any medication suggestions or think she should have a referral to GI MD.

## 2014-09-14 NOTE — Telephone Encounter (Signed)
Keep up fluid intake to avoid dehydration  Do not recommend she take anything for diarrhea since we do not know the cause  Watch for blood  Watch for s/s of dehydration  If no improvement tomorrow please let us know

## 2014-09-14 NOTE — Telephone Encounter (Signed)
Called and notified patient of Dr Marliss Coots comments. Patient verbalized understanding.

## 2014-10-25 ENCOUNTER — Telehealth: Payer: Self-pay | Admitting: Family Medicine

## 2014-10-25 NOTE — Telephone Encounter (Signed)
Error

## 2015-02-10 ENCOUNTER — Other Ambulatory Visit: Payer: Self-pay | Admitting: Family Medicine

## 2015-03-20 ENCOUNTER — Telehealth: Payer: Self-pay

## 2015-03-20 MED ORDER — TRAMADOL HCL 50 MG PO TABS: 50.0000 mg | ORAL_TABLET | Freq: Three times a day (TID) | ORAL | Status: DC | PRN

## 2015-03-20 NOTE — Telephone Encounter (Signed)
Rx called in as prescribed and pt notified and advise of Dr. Marliss Coots instructions and advise of fall risk/dizziness/drowsiness and verbalized understanding

## 2015-03-20 NOTE — Telephone Encounter (Signed)
Please call in tramadol for prn use  Take with food May cause dizziness/drowsiness- follow fall precautions See Dr Veverly Fells as planned

## 2015-03-20 NOTE — Telephone Encounter (Signed)
Pt felt like knee gave way on 03/15/15;03/18/15 knee very painful and swollen.pt did not have injury. Pt has appt with ortho Dr Veverly Fells on 03/22/15 at 8:30 AM but Dr Veverly Fells office said would need to get pain med from PCP until seen. Pt request pain med to [facility]. Pain level now is 10. Pt having difficulty bearing wt on leg. Pt request cb when med called to pharmacy.

## 2015-05-08 DIAGNOSIS — D485 Neoplasm of uncertain behavior of skin: Secondary | ICD-10-CM | POA: Diagnosis not present

## 2015-05-08 DIAGNOSIS — L821 Other seborrheic keratosis: Secondary | ICD-10-CM | POA: Diagnosis not present

## 2015-05-08 DIAGNOSIS — L570 Actinic keratosis: Secondary | ICD-10-CM | POA: Diagnosis not present

## 2015-05-08 DIAGNOSIS — L249 Irritant contact dermatitis, unspecified cause: Secondary | ICD-10-CM | POA: Diagnosis not present

## 2015-05-08 DIAGNOSIS — X32XXXA Exposure to sunlight, initial encounter: Secondary | ICD-10-CM | POA: Diagnosis not present

## 2015-05-08 DIAGNOSIS — D0439 Carcinoma in situ of skin of other parts of face: Secondary | ICD-10-CM | POA: Diagnosis not present

## 2015-06-06 DIAGNOSIS — M4697 Unspecified inflammatory spondylopathy, lumbosacral region: Secondary | ICD-10-CM | POA: Diagnosis not present

## 2015-06-06 DIAGNOSIS — M4806 Spinal stenosis, lumbar region: Secondary | ICD-10-CM | POA: Diagnosis not present

## 2015-06-07 ENCOUNTER — Other Ambulatory Visit: Payer: Self-pay | Admitting: Orthopedic Surgery

## 2015-06-07 DIAGNOSIS — M48061 Spinal stenosis, lumbar region without neurogenic claudication: Secondary | ICD-10-CM

## 2015-06-07 DIAGNOSIS — M47817 Spondylosis without myelopathy or radiculopathy, lumbosacral region: Secondary | ICD-10-CM

## 2015-06-19 DIAGNOSIS — F331 Major depressive disorder, recurrent, moderate: Secondary | ICD-10-CM | POA: Diagnosis not present

## 2015-06-23 ENCOUNTER — Ambulatory Visit
Admission: RE | Admit: 2015-06-23 | Discharge: 2015-06-23 | Disposition: A | Discharge status: Home / Self Care | Payer: PPO | Source: Ambulatory Visit | Attending: Orthopedic Surgery | Admitting: Orthopedic Surgery

## 2015-06-23 DIAGNOSIS — M48061 Spinal stenosis, lumbar region without neurogenic claudication: Secondary | ICD-10-CM

## 2015-06-23 DIAGNOSIS — M4697 Unspecified inflammatory spondylopathy, lumbosacral region: Secondary | ICD-10-CM | POA: Insufficient documentation

## 2015-06-23 DIAGNOSIS — M47817 Spondylosis without myelopathy or radiculopathy, lumbosacral region: Secondary | ICD-10-CM

## 2015-06-23 DIAGNOSIS — M4806 Spinal stenosis, lumbar region: Secondary | ICD-10-CM | POA: Diagnosis not present

## 2015-07-26 DIAGNOSIS — M4806 Spinal stenosis, lumbar region: Secondary | ICD-10-CM | POA: Diagnosis not present

## 2015-07-26 DIAGNOSIS — M5416 Radiculopathy, lumbar region: Secondary | ICD-10-CM | POA: Diagnosis not present

## 2015-07-26 DIAGNOSIS — M5136 Other intervertebral disc degeneration, lumbar region: Secondary | ICD-10-CM | POA: Diagnosis not present

## 2015-08-18 DIAGNOSIS — M5416 Radiculopathy, lumbar region: Secondary | ICD-10-CM | POA: Diagnosis not present

## 2015-08-18 DIAGNOSIS — M5136 Other intervertebral disc degeneration, lumbar region: Secondary | ICD-10-CM | POA: Diagnosis not present

## 2015-08-18 DIAGNOSIS — M4806 Spinal stenosis, lumbar region: Secondary | ICD-10-CM | POA: Diagnosis not present

## 2015-08-21 ENCOUNTER — Other Ambulatory Visit: Payer: Self-pay | Admitting: Family Medicine

## 2015-08-21 NOTE — Telephone Encounter (Signed)
Please schedule f/u in summer and refill until then thanks

## 2015-08-21 NOTE — Telephone Encounter (Signed)
Pt hasn't been seen in over a year and no future appts., please advise  

## 2015-08-22 NOTE — Telephone Encounter (Signed)
Left voicemail requesting pt to call office back 

## 2015-08-23 NOTE — Telephone Encounter (Signed)
appt scheduled and med refilled 

## 2015-09-18 DIAGNOSIS — F331 Major depressive disorder, recurrent, moderate: Secondary | ICD-10-CM | POA: Diagnosis not present

## 2015-10-04 DIAGNOSIS — M4697 Unspecified inflammatory spondylopathy, lumbosacral region: Secondary | ICD-10-CM | POA: Diagnosis not present

## 2015-10-04 DIAGNOSIS — M4806 Spinal stenosis, lumbar region: Secondary | ICD-10-CM | POA: Diagnosis not present

## 2015-10-04 DIAGNOSIS — M5416 Radiculopathy, lumbar region: Secondary | ICD-10-CM | POA: Diagnosis not present

## 2015-10-09 DIAGNOSIS — L821 Other seborrheic keratosis: Secondary | ICD-10-CM | POA: Diagnosis not present

## 2015-10-09 DIAGNOSIS — D225 Melanocytic nevi of trunk: Secondary | ICD-10-CM | POA: Diagnosis not present

## 2015-10-09 DIAGNOSIS — D2261 Melanocytic nevi of right upper limb, including shoulder: Secondary | ICD-10-CM | POA: Diagnosis not present

## 2015-10-09 DIAGNOSIS — Z85828 Personal history of other malignant neoplasm of skin: Secondary | ICD-10-CM | POA: Diagnosis not present

## 2015-10-25 ENCOUNTER — Ambulatory Visit (INDEPENDENT_AMBULATORY_CARE_PROVIDER_SITE_OTHER): Payer: PPO | Admitting: Family Medicine

## 2015-10-25 ENCOUNTER — Encounter: Payer: Self-pay | Admitting: Family Medicine

## 2015-10-25 VITALS — BP 122/68 | HR 88 | Temp 98.1°F | Ht 62.0 in | Wt 146.8 lb

## 2015-10-25 DIAGNOSIS — E559 Vitamin D deficiency, unspecified: Secondary | ICD-10-CM | POA: Diagnosis not present

## 2015-10-25 DIAGNOSIS — K219 Gastro-esophageal reflux disease without esophagitis: Secondary | ICD-10-CM | POA: Diagnosis not present

## 2015-10-25 DIAGNOSIS — I10 Essential (primary) hypertension: Secondary | ICD-10-CM

## 2015-10-25 DIAGNOSIS — D649 Anemia, unspecified: Secondary | ICD-10-CM | POA: Diagnosis not present

## 2015-10-25 DIAGNOSIS — E538 Deficiency of other specified B group vitamins: Secondary | ICD-10-CM

## 2015-10-25 DIAGNOSIS — F418 Other specified anxiety disorders: Secondary | ICD-10-CM

## 2015-10-25 DIAGNOSIS — F329 Major depressive disorder, single episode, unspecified: Secondary | ICD-10-CM

## 2015-10-25 DIAGNOSIS — F419 Anxiety disorder, unspecified: Secondary | ICD-10-CM

## 2015-10-25 DIAGNOSIS — F32A Depression, unspecified: Secondary | ICD-10-CM

## 2015-10-25 LAB — CBC WITH DIFFERENTIAL/PLATELET
BASOS PCT: 0.3 % (ref 0.0–3.0)
Basophils Absolute: 0 10*3/uL (ref 0.0–0.1)
EOS ABS: 0.3 10*3/uL (ref 0.0–0.7)
Eosinophils Relative: 2.1 % (ref 0.0–5.0)
HEMATOCRIT: 33.8 % — AB (ref 36.0–46.0)
Hemoglobin: 11.2 g/dL — ABNORMAL LOW (ref 12.0–15.0)
LYMPHS ABS: 1.7 10*3/uL (ref 0.7–4.0)
LYMPHS PCT: 14.4 % (ref 12.0–46.0)
MCHC: 33.1 g/dL (ref 30.0–36.0)
MCV: 87.3 fl (ref 78.0–100.0)
Monocytes Absolute: 0.8 10*3/uL (ref 0.1–1.0)
Monocytes Relative: 6.3 % (ref 3.0–12.0)
NEUTROS ABS: 9.2 10*3/uL — AB (ref 1.4–7.7)
Neutrophils Relative %: 76.9 % (ref 43.0–77.0)
PLATELETS: 360 10*3/uL (ref 150.0–400.0)
RBC: 3.87 Mil/uL (ref 3.87–5.11)
RDW: 15.2 % (ref 11.5–15.5)
WBC: 12 10*3/uL — ABNORMAL HIGH (ref 4.0–10.5)

## 2015-10-25 LAB — COMPREHENSIVE METABOLIC PANEL
ALT: 13 U/L (ref 0–35)
AST: 16 U/L (ref 0–37)
Albumin: 4.5 g/dL (ref 3.5–5.2)
Alkaline Phosphatase: 62 U/L (ref 39–117)
BUN: 17 mg/dL (ref 6–23)
CALCIUM: 10.1 mg/dL (ref 8.4–10.5)
CHLORIDE: 103 meq/L (ref 96–112)
CO2: 27 meq/L (ref 19–32)
Creatinine, Ser: 1.34 mg/dL — ABNORMAL HIGH (ref 0.40–1.20)
GFR: 40.68 mL/min — AB (ref >60.00)
Glucose, Bld: 97 mg/dL (ref 70–99)
Potassium: 4 mEq/L (ref 3.5–5.1)
Sodium: 140 mEq/L (ref 135–145)
Total Bilirubin: 0.4 mg/dL (ref 0.2–1.2)
Total Protein: 7.1 g/dL (ref 6.0–8.3)

## 2015-10-25 LAB — VITAMIN B12

## 2015-10-25 LAB — TSH: TSH: 2.18 u[IU]/mL (ref 0.35–4.50)

## 2015-10-25 LAB — FERRITIN: Ferritin: 10.9 ng/mL (ref 10.0–291.0)

## 2015-10-25 LAB — VITAMIN D 25 HYDROXY (VIT D DEFICIENCY, FRACTURES): VITD: 57.33 ng/mL (ref 30.00–100.00)

## 2015-10-25 MED ORDER — PANTOPRAZOLE SODIUM 40 MG PO TBEC: 40.0000 mg | DELAYED_RELEASE_TABLET | Freq: Every day | ORAL | 3 refills | Status: DC

## 2015-10-25 MED ORDER — CYANOCOBALAMIN 1000 MCG/ML IJ SOLN
1000.0000 ug | Freq: Once | INTRAMUSCULAR | Status: AC
  Administered 2015-10-25: 1000 ug via INTRAMUSCULAR

## 2015-10-25 MED ORDER — TRIAMTERENE-HCTZ 37.5-25 MG PO CAPS: ORAL_CAPSULE | ORAL | 3 refills | Status: DC

## 2015-10-25 MED ORDER — BISOPROLOL FUMARATE 5 MG PO TABS: ORAL_TABLET | ORAL | 3 refills | Status: DC

## 2015-10-25 NOTE — Progress Notes (Signed)
Subjective:    Patient ID: Jodi Bartlett, female    DOB: 1935-06-06, 80 y.o.   MRN: [id number]  HPI Here for f/u of chronic health problems   Feeling dizzy and weak lately- off and on for about a year - last week it was worse   Wt Readings from Last 3 Encounters:  10/25/15 146 lb 12 oz (66.6 kg)  07/04/14 152 lb 12.8 oz (69.3 kg)  03/16/14 149 lb 8 oz (67.8 kg)   bmi is 26.8 Wt is down 6 lb  Not trying to loose but less appetite   bp is stable today  No cp or palpitations or headaches or edema  No side effects to medicines  BP Readings from Last 3 Encounters:  10/25/15 122/68  07/04/14 124/70  03/16/14 (!) 142/78    On triamterene hctz and bisopropol  Hx of B12  Shots in the past  Takes it orally 1000 mcg daily Lab Results  Component Value Date   VITAMINB12 556 08/11/2014  feels tired -thinks she is low  Got some B12 shots in 2016   Hx of gerd On protonix 40 mg  esoph stricture in the past Still has some problems with acid reflux - burning and them occ vomits - more for past 3-4 mo  Not food related in her at all Needs to see GI  Wants to go to [place] - too hard for her to get to [place] anymore   Hx of vit D def Takes 2000 iu daily -taking regularly   Anxiety and depression hx  On wellbutrin -still really helps Worn out due to back pain and problems - but does not think it is from depression   Hx of anemia in the past Lab Results  Component Value Date   FERRITIN 4.2 (L) 07/04/2014   Lab Results  Component Value Date   WBC 5.5 08/11/2014   HGB 10.2 (L) 08/11/2014   HCT 29.9 (L) 08/11/2014   MCV 85.3 08/11/2014   PLT 298.0 08/11/2014   due for a check Esp in light of GI problems  Patient Active Problem List   Diagnosis Date Noted  . Low back pain 03/16/2014  . H/O finger fracture 05/14/2013  . Lumbar sprain 12/03/2012  . Insomnia 07/17/2012  . Lung mass 04/14/2012  . Lung abnormality 01/21/2012  . Anemia 11/13/2011  . DIVERTICULOSIS-COLON  05/29/2009  . ULCERATIVE COLITIS, HX OF 11/10/2008  . Vitamin D deficiency 10/11/2008  . B12 deficiency 10/04/2008  . BUNION 10/19/2007  . ESOPHAGEAL STRICTURE 10/13/2007  . IBS 10/01/2007  . ANXIETY 07/31/2007  . Anxiety and depression 07/31/2007  . Essential hypertension 07/31/2007  . GERD 07/31/2007  . COLONIC POLYPS, HX OF 07/31/2007   Past Medical History:  Diagnosis Date  . Allergy   . Anemia    hx of  . Anxiety   . Arthritis   . Back pain   . Blood transfusion without reported diagnosis   . Depressed   . Diverticulosis 2010   Colonoscopy  . Dizziness    vertigo  . GERD (gastroesophageal reflux disease)   . Hiatal hernia    EGD   . HTN (hypertension)   . Hx of colonic polyps 2010   Colonoscopy  . IBS (irritable bowel syndrome)   . Interstitial cystitis   . OA (ocular albinism) ([place])   . Ovarian cancer ([place])   . Stricture and stenosis of esophagus 2009   EGD   . UTI (lower urinary tract infection)  hx of     Past Surgical History:  Procedure Laterality Date  . APPENDECTOMY    . breast biopsy- benign    . bunion/morton's neuroma  4/07   right foot   . carpal tunnel release-right    . CHOLECYSTECTOMY  1994  . colitis transfusion     hosp at 25  . COLONOSCOPY     very difficul- supp with BE past  . CT HEAD (other)  6/10   small vessel chronic change/ no acute findings  . EGD tight ues sphincter  7/09   with dialtion  . FOOT SURGERY  10/09   TMT fusion and bunionectomy  . HEMORRHOID SURGERY    . hysterectomy (other)     age 44; ovarian cancer (1962)  . INCISIONAL HERNIA REPAIR     after ccy  . left knee surgery    . left shoulder replacement    . right shoulder surgery    . stress cardiolite  9/10  . VIDEO BRONCHOSCOPY  04/22/2012   Procedure: VIDEO BRONCHOSCOPY WITH FLUORO;  Surgeon: Juanito Doom, MD;  Location: [facility];  Service: Cardiopulmonary;  Laterality: Bilateral;   Social History  Substance Use Topics  . Smoking status:  Never Smoker  . Smokeless tobacco: Never Used  . Alcohol use Yes     Comment: social wine    Family History  Problem Relation Age of Onset  . Coronary artery disease Father   . Hypertension Father   . Heart disease Father     CAD  . Hypertension Sister   . Hyperlipidemia Sister   . Stroke Mother   . Colon cancer Neg Hx   . Esophageal cancer Neg Hx   . Rectal cancer Neg Hx   . Stomach cancer Neg Hx    No Known Allergies Current Outpatient Prescriptions on File Prior to Visit  Medication Sig Dispense Refill  . ALPRAZolam (XANAX) 0.25 MG tablet Take 0.25 mg by mouth at bedtime as needed.     Marland Kitchen buPROPion (WELLBUTRIN SR) 150 MG 12 hr tablet Take 300 mg by mouth daily.     Marland Kitchen gabapentin (NEURONTIN) 100 MG capsule Take 1-3 capsules by mouth at bedtime for RLS     No current facility-administered medications on file prior to visit.     Review of Systems Review of Systems  Constitutional: Negative for fever, appetite change, and unexpected weight change. pos for fatigue /malaise  Eyes: Negative for pain and visual disturbance.  Respiratory: Negative for cough and shortness of breath.   Cardiovascular: Negative for cp or palpitations    Gastrointestinal: Negative for nausea, diarrhea and constipation.  Genitourinary: Negative for urgency and frequency.  Skin: Negative for pallor or rash   MSK pos for low back pain that is severe at times and limiting mobility Neurological: Negative for weakness, light-headedness, numbness and headaches.  Hematological: Negative for adenopathy. Does not bruise/bleed easily.  Psychiatric/Behavioral: Negative for dysphoric mood. The patient is not nervous/anxious.(symptoms are well controlled with her current medication), pos for frustration over chronic pain          Objective:   Physical Exam  Constitutional: She appears well-developed and well-nourished. No distress.  overwt and well app  HENT:  Head: Normocephalic and atraumatic.    Mouth/Throat: Oropharynx is clear and moist.  Eyes: Conjunctivae and EOM are normal. Pupils are equal, round, and reactive to light.  Neck: Normal range of motion. Neck supple. No JVD present. Carotid bruit is not present. No thyromegaly  present.  Cardiovascular: Normal rate, regular rhythm, normal heart sounds and intact distal pulses.  Exam reveals no gallop.   Pulmonary/Chest: Effort normal and breath sounds normal. No respiratory distress. She has no wheezes. She has no rales.  No crackles  Abdominal: Soft. Bowel sounds are normal. She exhibits no distension, no abdominal bruit and no mass. There is no tenderness.  Musculoskeletal: She exhibits tenderness. She exhibits no edema.  Limited rom of LS with tenderness and slow gait  Lymphadenopathy:    She has no cervical adenopathy.  Neurological: She is alert. She has normal reflexes. She displays no atrophy. No cranial nerve deficit. She exhibits normal muscle tone. Coordination normal.  Skin: Skin is warm and dry. No rash noted. No pallor.  Psychiatric: She has a normal mood and affect.  Pt is frustrated over medical problems but still pleasant and talkative           Assessment & Plan:   Problem List Items Addressed This Visit      Cardiovascular and Mediastinum   Essential hypertension - Primary    bp in fair control at this time  BP Readings from Last 1 Encounters:  10/25/15 122/68   No changes needed Disc lifstyle change with low sodium diet and exercise        Relevant Medications   triamterene-hydrochlorothiazide (DYAZIDE) 37.5-25 MG capsule   bisoprolol (ZEBETA) 5 MG tablet   Other Relevant Orders   Comprehensive metabolic panel (Completed)   TSH (Completed)     Digestive   GERD    Worse lately with some symptoms of stricture Also extreme fatigue  Also past hx of anemia Check cbc/ferritin Continue protonix Ref to GI for f/u      Relevant Medications   pantoprazole (PROTONIX) 40 MG tablet   Other  Relevant Orders   Ambulatory referral to Gastroenterology   B12 deficiency    Check level on oral B12 Then give B12 shot  ? If reason for fatigue       Relevant Medications   cyanocobalamin ((VITAMIN B-12)) injection 1,000 mcg (Completed)   Other Relevant Orders   Vitamin B12 (Completed)     Other   Vitamin D deficiency    Level today Disc imp to overall and bone health Is more fatigued lately       Relevant Orders   VITAMIN D 25 Hydroxy (Vit-D Deficiency, Fractures) (Completed)   Anxiety and depression   Anemia   Relevant Medications   vitamin B-12 (CYANOCOBALAMIN) 1000 MCG tablet   cyanocobalamin ((VITAMIN B-12)) injection 1,000 mcg (Completed)   Other Relevant Orders   CBC with Differential/Platelet (Completed)   Ferritin (Completed)   Ambulatory referral to Gastroenterology    Other Visit Diagnoses   None.

## 2015-10-25 NOTE — Progress Notes (Signed)
Pre visit review using our clinic review tool, if applicable. No additional management support is needed unless otherwise documented below in the visit note. 

## 2015-10-25 NOTE — Patient Instructions (Addendum)
Labs today for anemia and B12 and fatigue  B12 shot today Schedule a physical and medicare visit in 6 months  Stop at check out for referral to GI

## 2015-10-26 NOTE — Assessment & Plan Note (Signed)
bp in fair control at this time  BP Readings from Last 1 Encounters:  10/25/15 122/68   No changes needed Disc lifstyle change with low sodium diet and exercise

## 2015-10-26 NOTE — Assessment & Plan Note (Signed)
Worse lately with some symptoms of stricture Also extreme fatigue  Also past hx of anemia Check cbc/ferritin Continue protonix Ref to GI for f/u

## 2015-10-26 NOTE — Assessment & Plan Note (Signed)
Level today Disc imp to overall and bone health Is more fatigued lately

## 2015-10-26 NOTE — Assessment & Plan Note (Signed)
Check level on oral B12 Then give B12 shot  ? If reason for fatigue

## 2015-10-30 ENCOUNTER — Ambulatory Visit (INDEPENDENT_AMBULATORY_CARE_PROVIDER_SITE_OTHER): Payer: PPO | Admitting: Family Medicine

## 2015-10-30 ENCOUNTER — Ambulatory Visit (INDEPENDENT_AMBULATORY_CARE_PROVIDER_SITE_OTHER)
Admission: RE | Admit: 2015-10-30 | Discharge: 2015-10-30 | Disposition: A | Discharge status: Home / Self Care | Payer: PPO | Source: Ambulatory Visit | Attending: Family Medicine | Admitting: Family Medicine

## 2015-10-30 ENCOUNTER — Encounter: Payer: Self-pay | Admitting: Family Medicine

## 2015-10-30 VITALS — BP 136/68 | HR 73 | Temp 98.1°F | Ht 62.0 in | Wt 149.8 lb

## 2015-10-30 DIAGNOSIS — J449 Chronic obstructive pulmonary disease, unspecified: Secondary | ICD-10-CM | POA: Diagnosis not present

## 2015-10-30 DIAGNOSIS — R5383 Other fatigue: Secondary | ICD-10-CM | POA: Insufficient documentation

## 2015-10-30 DIAGNOSIS — R42 Dizziness and giddiness: Secondary | ICD-10-CM

## 2015-10-30 DIAGNOSIS — D72829 Elevated white blood cell count, unspecified: Secondary | ICD-10-CM | POA: Diagnosis not present

## 2015-10-30 DIAGNOSIS — R5382 Chronic fatigue, unspecified: Secondary | ICD-10-CM

## 2015-10-30 DIAGNOSIS — R6889 Other general symptoms and signs: Secondary | ICD-10-CM | POA: Insufficient documentation

## 2015-10-30 DIAGNOSIS — D649 Anemia, unspecified: Secondary | ICD-10-CM

## 2015-10-30 DIAGNOSIS — I10 Essential (primary) hypertension: Secondary | ICD-10-CM

## 2015-10-30 DIAGNOSIS — E559 Vitamin D deficiency, unspecified: Secondary | ICD-10-CM

## 2015-10-30 DIAGNOSIS — R748 Abnormal levels of other serum enzymes: Secondary | ICD-10-CM | POA: Diagnosis not present

## 2015-10-30 DIAGNOSIS — E538 Deficiency of other specified B group vitamins: Secondary | ICD-10-CM

## 2015-10-30 DIAGNOSIS — F411 Generalized anxiety disorder: Secondary | ICD-10-CM

## 2015-10-30 LAB — POC URINALSYSI DIPSTICK (AUTOMATED)
Blood, UA: NEGATIVE
Glucose, UA: NEGATIVE
KETONES UA: NEGATIVE
NITRITE UA: NEGATIVE
PH UA: 6
Protein, UA: NEGATIVE
SPEC GRAV UA: 1.015
UROBILINOGEN UA: 0.2

## 2015-10-30 NOTE — Assessment & Plan Note (Signed)
Vitamin D level is therapeutic with current supplementation Disc importance of this to bone and overall health  

## 2015-10-30 NOTE — Assessment & Plan Note (Signed)
Suspect due to dec activity 2ndary to severe chronic back pain  EKG today is very reassuring

## 2015-10-30 NOTE — Assessment & Plan Note (Signed)
Very mild  Unsure of cause ua fairly bland-pending cx  cxr - pending rad review  Reassuring exam  Will re check in 2 wk

## 2015-10-30 NOTE — Assessment & Plan Note (Signed)
Pt has overwhelming fatigue  Also chronic back pain (moderate to severe) Exercise intolerance  Sedating medicines incl hydrocodone and gabapentin and xanax  Inc cr- ? Due to past nsaid  Inc wbc- pending cxr reading and also urine cx

## 2015-10-30 NOTE — Patient Instructions (Addendum)
You are on several sedating medicines including gabapentin and hydrocodone and xanax  Also chronic pain can cause fatigue (especially if it keeps you from getting good rest)   I think your GI appointment is very important - do proceed with that   I want to repeat labs in 2 weeks for your kidney function and blood count  EKG looks good today   After all of this if you want to work up the dizziness further - we could refer you to a neurologist - just keep me posted   Please call back to schedule a lab appointment (you do not have to see me or fast for that)

## 2015-10-30 NOTE — Assessment & Plan Note (Signed)
Chronic-waxes and wanes  Reassuring exam Pt may be open to eval by neuro in the future after GI visit and w/u for fatigue

## 2015-10-30 NOTE — Assessment & Plan Note (Signed)
Lab Results  Component Value Date   VITAMINB12 >1500 (H) 10/25/2015   This is controlled with oral supplementation

## 2015-10-30 NOTE — Progress Notes (Signed)
Subjective:    Patient ID: Jodi Bartlett, female    DOB: [birth year], 80 y.o.   MRN: [id number]  HPI Here for f/u of chronic health problems as well as fatigue    Is still  Very fatigued/NOT depressed  Also feels vaguely dizzy all the time Cannot "do anything" No head injuries or accidents or falls  But her back is very painful- keeps getting rounds of shots - and then will likely see a spine specialist (? Surgery)  Is now off of aleve (did take it)   Has xanax- occ takes it mid day -times when she really needs it  Takes hydrocodone -on average 2 pills per day  Only takes gabapentin at bedtime   Wt Readings from Last 3 Encounters:  10/30/15 149 lb 12 oz (67.9 kg)  10/25/15 146 lb 12 oz (66.6 kg)  07/04/14 152 lb 12.8 oz (69.3 kg)   bmi is 27.3 Not active because not feeling well   BP Readings from Last 3 Encounters:  10/30/15 136/68  10/25/15 122/68  07/04/14 124/70       Chemistry      Component Value Date/Time   NA 140 10/25/2015 1200   K 4.0 10/25/2015 1200   CL 103 10/25/2015 1200   CO2 27 10/25/2015 1200   BUN 17 10/25/2015 1200   CREATININE 1.34 (H) 10/25/2015 1200   CREATININE 0.95 12/10/2011 1300      Component Value Date/Time   CALCIUM 10.1 10/25/2015 1200   ALKPHOS 62 10/25/2015 1200   AST 16 10/25/2015 1200   ALT 13 10/25/2015 1200   BILITOT 0.4 10/25/2015 1200     Cr was up  Pt states she drinks a lot of water -20 oz - fills up 3-4 times per day and keeps jugs of water in the fridge too  No tea or soft drinks  Some coffee  Glucose is nl at 97 GFR is 40.68 Does take diazide    Lab Results  Component Value Date   FERRITIN 10.9 10/25/2015   Lab Results  Component Value Date   VITAMINB12 >1500 (H) 10/25/2015   Lab Results  Component Value Date   WBC 12.0 (H) 10/25/2015   HGB 11.2 (L) 10/25/2015   HCT 33.8 (L) 10/25/2015   MCV 87.3 10/25/2015   PLT 360.0 10/25/2015    Has appt upcoming to see GI for GERD with dysphasia - sept 6th    Wbc ct is slightly elevated   Lab Results  Component Value Date   TSH 2.18 10/25/2015    Results for orders placed or performed in visit on 10/30/15  POCT Urinalysis Dipstick (Automated)  Result Value Ref Range   Color, UA Yellow    Clarity, UA Hazy    Glucose, UA Negative    Bilirubin, UA Trace    Ketones, UA Negative    Spec Grav, UA 1.015    Blood, UA Negative    pH, UA 6.0    Protein, UA Negative    Urobilinogen, UA 0.2    Nitrite, UA Negative    Leukocytes, UA Trace (A) Negative   will plan to culture urine   CXR today- by my review looks good -pending eval from radiology   Pt worries about heart problems  Sometimes gets mid back pain (not cp) No sob  Tires out easily - intolerant of any exercise  Had myocardial scan in 2014 that was normal EKG is wnl with NSR rate of 67  Patient Active Problem  List   Diagnosis Date Noted  . Leukocytosis 10/30/2015  . Fatigue 10/30/2015  . Exercise intolerance 10/30/2015  . Dizziness 10/30/2015  . Low back pain 03/16/2014  . H/O finger fracture 05/14/2013  . Lumbar sprain 12/03/2012  . Insomnia 07/17/2012  . Lung mass 04/14/2012  . Lung abnormality 01/21/2012  . Anemia 11/13/2011  . DIVERTICULOSIS-COLON 05/29/2009  . ULCERATIVE COLITIS, HX OF 11/10/2008  . Vitamin D deficiency 10/11/2008  . B12 deficiency 10/04/2008  . BUNION 10/19/2007  . ESOPHAGEAL STRICTURE 10/13/2007  . IBS 10/01/2007  . Generalized anxiety disorder 07/31/2007  . Anxiety and depression 07/31/2007  . Essential hypertension 07/31/2007  . GERD 07/31/2007  . COLONIC POLYPS, HX OF 07/31/2007   Past Medical History:  Diagnosis Date  . Allergy   . Anemia    hx of  . Anxiety   . Arthritis   . Back pain   . Blood transfusion without reported diagnosis   . Depressed   . Diverticulosis 2010   Colonoscopy  . Dizziness    vertigo  . GERD (gastroesophageal reflux disease)   . Hiatal hernia    EGD   . HTN (hypertension)   . Hx of colonic polyps  2010   Colonoscopy  . IBS (irritable bowel syndrome)   . Interstitial cystitis   . OA (ocular albinism) ([place])   . Ovarian cancer ([place])   . Stricture and stenosis of esophagus 2009   EGD   . UTI (lower urinary tract infection)    hx of     Past Surgical History:  Procedure Laterality Date  . APPENDECTOMY    . breast biopsy- benign    . bunion/morton's neuroma  4/07   right foot   . carpal tunnel release-right    . CHOLECYSTECTOMY  1994  . colitis transfusion     hosp at 25  . COLONOSCOPY     very difficul- supp with BE past  . CT HEAD (other)  6/10   small vessel chronic change/ no acute findings  . EGD tight ues sphincter  7/09   with dialtion  . FOOT SURGERY  10/09   TMT fusion and bunionectomy  . HEMORRHOID SURGERY    . hysterectomy (other)     age 37; ovarian cancer (1962)  . INCISIONAL HERNIA REPAIR     after ccy  . left knee surgery    . left shoulder replacement    . right shoulder surgery    . stress cardiolite  9/10  . VIDEO BRONCHOSCOPY  04/22/2012   Procedure: VIDEO BRONCHOSCOPY WITH FLUORO;  Surgeon: Juanito Doom, MD;  Location: [facility];  Service: Cardiopulmonary;  Laterality: Bilateral;   Social History  Substance Use Topics  . Smoking status: Never Smoker  . Smokeless tobacco: Never Used  . Alcohol use Yes     Comment: social wine    Family History  Problem Relation Age of Onset  . Coronary artery disease Father   . Hypertension Father   . Heart disease Father     CAD  . Hypertension Sister   . Hyperlipidemia Sister   . Stroke Mother   . Colon cancer Neg Hx   . Esophageal cancer Neg Hx   . Rectal cancer Neg Hx   . Stomach cancer Neg Hx    No Known Allergies Current Outpatient Prescriptions on File Prior to Visit  Medication Sig Dispense Refill  . ALPRAZolam (XANAX) 0.25 MG tablet Take 0.25 mg by mouth at bedtime as needed.     Marland Kitchen  bisoprolol (ZEBETA) 5 MG tablet TAKE ONE-HALF (1/2) TABLET BY MOUTH DAILY 45 tablet 3  . buPROPion  (WELLBUTRIN SR) 150 MG 12 hr tablet Take 300 mg by mouth daily.     . Cholecalciferol (VITAMIN D3) 1000 units CAPS Take 2,000 Units by mouth daily.    Marland Kitchen gabapentin (NEURONTIN) 100 MG capsule Take 1-3 capsules by mouth at bedtime for RLS    . pantoprazole (PROTONIX) 40 MG tablet Take 1 tablet (40 mg total) by mouth daily. 90 tablet 3  . triamterene-hydrochlorothiazide (DYAZIDE) 37.5-25 MG capsule TAKE 1 CAPSULE BY MOUTH EVERY DAY. 90 capsule 3  . vitamin B-12 (CYANOCOBALAMIN) 1000 MCG tablet Take 1,000 mcg by mouth daily.     No current facility-administered medications on file prior to visit.      Review of Systems    Review of Systems  Constitutional: Negative for fever, appetite change, and unexpected weight change. pos for fatigue and malaise  Eyes: Negative for pain and visual disturbance.  Respiratory: Negative for cough and shortness of breath.   Cardiovascular: Negative for cp or palpitations   pos for occ pain between shoulder blades  Gastrointestinal: Negative for nausea, diarrhea and constipation. neg for blood in stool, pos for heartburn with dysphagia  Genitourinary: Negative for urgency and frequency. neg for dysuria MSK pos for chronic severe back pain  Skin: Negative for pallor or rash   Neurological: Negative for weakness, , numbness and headaches. pos for chronic intermittent dizziness Hematological: Negative for adenopathy. Does not bruise/bleed easily.  Psychiatric/Behavioral: Negative for dysphoric mood. The patient is nervous/anxious.      Objective:   Physical Exam  Constitutional: She is oriented to person, place, and time. She appears well-developed and well-nourished. No distress.  Well appearing   HENT:  Head: Normocephalic and atraumatic.  Right Ear: External ear normal.  Left Ear: External ear normal.  Nose: Nose normal.  Mouth/Throat: Oropharynx is clear and moist. No oropharyngeal exudate.  No sinus tenderness No temporal tenderness  No TMJ  tenderness Partial cerumen impaction bilat   Eyes: Conjunctivae and EOM are normal. Pupils are equal, round, and reactive to light. Right eye exhibits no discharge. Left eye exhibits no discharge. No scleral icterus.  No nystagmus  Neck: Normal range of motion and full passive range of motion without pain. Neck supple. No JVD present. Carotid bruit is not present. No tracheal deviation present. No thyromegaly present.  Cardiovascular: Normal rate, regular rhythm, normal heart sounds and intact distal pulses.  Exam reveals no gallop.   No murmur heard. Pulmonary/Chest: Effort normal and breath sounds normal. No respiratory distress. She has no wheezes. She has no rales.  No crackles  Abdominal: Soft. Bowel sounds are normal. She exhibits no distension, no abdominal bruit and no mass. There is no tenderness.  Musculoskeletal: She exhibits no edema or tenderness.  Poor rom of TS and LS  Slow gait due to chronic back pain   Lymphadenopathy:    She has no cervical adenopathy.  Neurological: She is alert and oriented to person, place, and time. She has normal strength and normal reflexes. She displays no atrophy and no tremor. No cranial nerve deficit or sensory deficit. She exhibits normal muscle tone. She displays a negative Romberg sign. Coordination and gait normal.  No focal cerebellar signs   Skin: Skin is warm and dry. No rash noted. No pallor.  Psychiatric: She has a normal mood and affect. Her behavior is normal. Thought content normal.  Mildly anxious/baseline Seems fatigued  Assessment & Plan:   Problem List Items Addressed This Visit      Cardiovascular and Mediastinum   Essential hypertension    bp in fair control at this time  BP Readings from Last 1 Encounters:  10/30/15 136/68  pt was mildly anxious today No changes needed Disc lifstyle change with low sodium diet and exercise  Labs reviewed         Digestive   B12 deficiency    Lab Results  Component  Value Date   VITAMINB12 >1500 (H) 10/25/2015   This is controlled with oral supplementation        Other   Vitamin D deficiency    Vitamin D level is therapeutic with current supplementation Disc importance of this to bone and overall health       Leukocytosis    Very mild  Unsure of cause ua fairly bland-pending cx  cxr - pending rad review  Reassuring exam  Will re check in 2 wk       Relevant Orders   DG Chest 2 View   POCT Urinalysis Dipstick (Automated) (Completed)   Urine culture   Generalized anxiety disorder    Pt sees psychiatry  Xanax prn- aware this can sedate and worsen fatigue       Fatigue - Primary    Pt has overwhelming fatigue  Also chronic back pain (moderate to severe) Exercise intolerance  Sedating medicines incl hydrocodone and gabapentin and xanax  Inc cr- ? Due to past nsaid  Inc wbc- pending cxr reading and also urine cx       Relevant Orders   DG Chest 2 View   EKG 12-Lead (Completed)   Exercise intolerance    Suspect due to dec activity 2ndary to severe chronic back pain  EKG today is very reassuring       Relevant Orders   EKG 12-Lead (Completed)   Dizziness    Chronic-waxes and wanes  Reassuring exam Pt may be open to eval by neuro in the future after GI visit and w/u for fatigue       Relevant Orders   EKG 12-Lead (Completed)   Anemia    Cbc and ferritin were improved at last check  Mild  Will f/u with GI as planned        Other Visit Diagnoses   None.

## 2015-10-30 NOTE — Assessment & Plan Note (Signed)
bp in fair control at this time  BP Readings from Last 1 Encounters:  10/30/15 136/68  pt was mildly anxious today No changes needed Disc lifstyle change with low sodium diet and exercise  Labs reviewed

## 2015-10-30 NOTE — Assessment & Plan Note (Signed)
Cbc and ferritin were improved at last check  Mild  Will f/u with GI as planned

## 2015-10-30 NOTE — Assessment & Plan Note (Signed)
Pt sees psychiatry  Xanax prn- aware this can sedate and worsen fatigue

## 2015-10-30 NOTE — Progress Notes (Signed)
Pre visit review using our clinic review tool, if applicable. No additional management support is needed unless otherwise documented below in the visit note. 

## 2015-10-31 LAB — URINE CULTURE: Organism ID, Bacteria: NO GROWTH

## 2015-11-03 DIAGNOSIS — M5136 Other intervertebral disc degeneration, lumbar region: Secondary | ICD-10-CM | POA: Diagnosis not present

## 2015-11-03 DIAGNOSIS — M5416 Radiculopathy, lumbar region: Secondary | ICD-10-CM | POA: Diagnosis not present

## 2015-11-03 DIAGNOSIS — M4806 Spinal stenosis, lumbar region: Secondary | ICD-10-CM | POA: Diagnosis not present

## 2015-11-07 ENCOUNTER — Telehealth: Payer: Self-pay | Admitting: Family Medicine

## 2015-11-07 NOTE — Telephone Encounter (Signed)
Patient Name: Jodi Bartlett  DOB: 04-13-1935    Initial Comment Caller says her kidney levels were out and her white cells were up las t week. she had a shot for her back pain on Friday, The doctor told not to take Aleeve or any OTC. She continues to have the pain; did not sleep at all. Wants the doctor to know becase she told her to inform her   Nurse Assessment  Nurse: Thad Ranger, RN, Langley Gauss Date/Time (Eastern Time): 11/07/2015 4:21:12 PM  Confirm and document reason for call. If symptomatic, describe symptoms. You must click the next button to save text entered. ---Caller says her kidney levels and her white cells were elevated last week. The doctor told not to take any NSAIDs OTC. She continues to have the back pain, not sleeping well, and she sees a back specialist that gives her injections in her back. Wants to know what she can take for the back pain since she can not take NSAIDs due to elevated kidney levels. States the MD told her she did not have a UTI.  Has the patient traveled out of the country within the last 30 days? ---Not Applicable  Does the patient have any new or worsening symptoms? ---No  Please document clinical information provided and list any resource used. ---Denies need for triage. Based on RN clinical knowledge, advised she can take OTC ES Tylenol 2 tabs po q 8 hrs prn back pain but do not take NSAIDs or ASA products a/o by MD. Verb understanding.     Guidelines    Guideline Title Affirmed Question Affirmed Notes       Final Disposition User   Clinical Call [place], RN, [employer]

## 2015-11-07 NOTE — Telephone Encounter (Signed)
norco is on her med list- it may be from [place]  Is she taking that?

## 2015-11-08 ENCOUNTER — Encounter: Payer: Self-pay | Admitting: Family Medicine

## 2015-11-08 DIAGNOSIS — R7989 Other specified abnormal findings of blood chemistry: Secondary | ICD-10-CM | POA: Insufficient documentation

## 2015-11-08 NOTE — Telephone Encounter (Signed)
Left voicemail requesting pt to call the office back 

## 2015-11-14 ENCOUNTER — Other Ambulatory Visit (INDEPENDENT_AMBULATORY_CARE_PROVIDER_SITE_OTHER): Payer: PPO

## 2015-11-14 DIAGNOSIS — I10 Essential (primary) hypertension: Secondary | ICD-10-CM

## 2015-11-14 DIAGNOSIS — D72829 Elevated white blood cell count, unspecified: Secondary | ICD-10-CM

## 2015-11-14 LAB — CBC WITH DIFFERENTIAL/PLATELET
BASOS PCT: 0.6 % (ref 0.0–3.0)
Basophils Absolute: 0 10*3/uL (ref 0.0–0.1)
EOS PCT: 5.7 % — AB (ref 0.0–5.0)
Eosinophils Absolute: 0.3 10*3/uL (ref 0.0–0.7)
HEMATOCRIT: 32.8 % — AB (ref 36.0–46.0)
HEMOGLOBIN: 10.8 g/dL — AB (ref 12.0–15.0)
LYMPHS PCT: 20.6 % (ref 12.0–46.0)
Lymphs Abs: 1.2 10*3/uL (ref 0.7–4.0)
MCHC: 32.9 g/dL (ref 30.0–36.0)
MCV: 88.2 fl (ref 78.0–100.0)
MONOS PCT: 8.8 % (ref 3.0–12.0)
Monocytes Absolute: 0.5 10*3/uL (ref 0.1–1.0)
Neutro Abs: 3.8 10*3/uL (ref 1.4–7.7)
Neutrophils Relative %: 64.3 % (ref 43.0–77.0)
Platelets: 366 10*3/uL (ref 150.0–400.0)
RBC: 3.72 Mil/uL — ABNORMAL LOW (ref 3.87–5.11)
RDW: 15.6 % — AB (ref 11.5–15.5)
WBC: 5.8 10*3/uL (ref 4.0–10.5)

## 2015-11-14 LAB — BASIC METABOLIC PANEL
BUN: 13 mg/dL (ref 6–23)
CALCIUM: 9.9 mg/dL (ref 8.4–10.5)
CO2: 28 mEq/L (ref 19–32)
Chloride: 102 mEq/L (ref 96–112)
Creatinine, Ser: 1.02 mg/dL (ref 0.40–1.20)
GFR: 55.73 mL/min — AB (ref >60.00)
GLUCOSE: 78 mg/dL (ref 70–99)
POTASSIUM: 4.1 meq/L (ref 3.5–5.1)
Sodium: 138 mEq/L (ref 135–145)

## 2015-11-14 NOTE — Telephone Encounter (Signed)
Left voicemail requesting pt to call office back 

## 2015-11-15 ENCOUNTER — Encounter: Payer: Self-pay | Admitting: *Deleted

## 2015-11-20 ENCOUNTER — Telehealth: Payer: Self-pay

## 2015-11-20 NOTE — Telephone Encounter (Signed)
Keep your GI appt- keep note of anything you have eaten lately that may be unusual.  Labs all looked ok.  Alert me if other symptoms develop.  Thanks for letting me know.

## 2015-11-20 NOTE — Telephone Encounter (Signed)
Pt left v/m; for 5 days pt has had white stools; normal but soft stools; no blood; has not changed diet. Pt has appt in Sept to see GI doctor.

## 2015-11-20 NOTE — Telephone Encounter (Signed)
Pt notified of Dr. Marliss Coots comments and verbalized understanding. Pt will keep appt with GI and she hasn't eaten anything abnormal

## 2015-11-27 DIAGNOSIS — M25551 Pain in right hip: Secondary | ICD-10-CM | POA: Diagnosis not present

## 2015-11-27 DIAGNOSIS — M4697 Unspecified inflammatory spondylopathy, lumbosacral region: Secondary | ICD-10-CM | POA: Diagnosis not present

## 2015-11-27 DIAGNOSIS — M1611 Unilateral primary osteoarthritis, right hip: Secondary | ICD-10-CM | POA: Diagnosis not present

## 2015-11-27 DIAGNOSIS — M1612 Unilateral primary osteoarthritis, left hip: Secondary | ICD-10-CM | POA: Diagnosis not present

## 2015-11-27 DIAGNOSIS — M4806 Spinal stenosis, lumbar region: Secondary | ICD-10-CM | POA: Diagnosis not present

## 2015-11-27 DIAGNOSIS — M5136 Other intervertebral disc degeneration, lumbar region: Secondary | ICD-10-CM | POA: Diagnosis not present

## 2015-11-27 DIAGNOSIS — M25552 Pain in left hip: Secondary | ICD-10-CM | POA: Diagnosis not present

## 2015-12-06 ENCOUNTER — Encounter: Payer: Self-pay | Admitting: Gastroenterology

## 2015-12-06 ENCOUNTER — Ambulatory Visit (INDEPENDENT_AMBULATORY_CARE_PROVIDER_SITE_OTHER): Payer: PPO | Admitting: Gastroenterology

## 2015-12-06 ENCOUNTER — Other Ambulatory Visit: Payer: Self-pay

## 2015-12-06 VITALS — BP 153/77 | HR 74 | Temp 98.1°F | Ht 62.0 in | Wt 151.0 lb

## 2015-12-06 DIAGNOSIS — K589 Irritable bowel syndrome without diarrhea: Secondary | ICD-10-CM | POA: Diagnosis not present

## 2015-12-06 DIAGNOSIS — K219 Gastro-esophageal reflux disease without esophagitis: Secondary | ICD-10-CM

## 2015-12-06 DIAGNOSIS — R140 Abdominal distension (gaseous): Secondary | ICD-10-CM

## 2015-12-06 NOTE — Progress Notes (Signed)
Gastroenterology Consultation  Referring Provider:     Abner Greenspan, MD Primary Care Physician:  Loura Pardon, MD Primary Gastroenterologist:  Dr. Allen Norris     Reason for Consultation:     Bloating and abdominal discomfort        HPI:   Jodi Bartlett is a 80 y.o. y/o female referred for consultation & management of Bloating and abdominal discomfort by Dr. Loura Pardon, MD.  This patient comes today with a history of long-standing GI problems. The patient has had multiple procedures done in [place] but states she still has her problems. The patient reports that she has a lot of reflux symptoms in the morning when she wakes up with a sour taste in her mouth. The patient reports she is on Protonix that she takes in the morning. She also reports that she has gas and bloating which results in her having a bowel movement that will relieve her symptoms but then result in significant amounts of diarrhea. The patient also reports that she does partake in dairy products including cheese and ice cream regularly. The patient states that she has some pain that starts between her shoulder blades and goes to her epigastric area. There is no report of any unexplained weight loss, fevers, chills, nausea or vomiting. The patient had been following up in [place] but did not want to travel there. She also reports chronic back pain.  Past Medical History:  Diagnosis Date  . Allergy   . Anemia    hx of  . Anxiety   . Arthritis   . Back pain   . Blood transfusion without reported diagnosis   . Depressed   . Diverticulosis 2010   Colonoscopy  . Dizziness    vertigo  . GERD (gastroesophageal reflux disease)   . Hiatal hernia    EGD   . HTN (hypertension)   . Hx of colonic polyps 2010   Colonoscopy  . IBS (irritable bowel syndrome)   . Interstitial cystitis   . OA (ocular albinism) ([place])   . Ovarian cancer ([place])   . Stricture and stenosis of esophagus 2009   EGD   . UTI (lower urinary tract  infection)    hx of      Past Surgical History:  Procedure Laterality Date  . APPENDECTOMY    . breast biopsy- benign    . bunion/morton's neuroma  4/07   right foot   . carpal tunnel release-right    . CHOLECYSTECTOMY  1994  . colitis transfusion     hosp at 25  . COLONOSCOPY     very difficul- supp with BE past  . CT HEAD (other)  6/10   small vessel chronic change/ no acute findings  . EGD tight ues sphincter  7/09   with dialtion  . FOOT SURGERY  10/09   TMT fusion and bunionectomy  . HEMORRHOID SURGERY    . hysterectomy (other)     age 55; ovarian cancer (1962)  . INCISIONAL HERNIA REPAIR     after ccy  . left knee surgery    . left shoulder replacement    . right shoulder surgery    . stress cardiolite  9/10  . VIDEO BRONCHOSCOPY  04/22/2012   Procedure: VIDEO BRONCHOSCOPY WITH FLUORO;  Surgeon: Juanito Doom, MD;  Location: [facility];  Service: Cardiopulmonary;  Laterality: Bilateral;    Prior to Admission medications   Medication Sig Start Date End Date Taking? Authorizing Provider  ALPRAZolam (  XANAX) 0.25 MG tablet Take 0.25 mg by mouth at bedtime as needed.    Yes Historical Provider, MD  bisoprolol (ZEBETA) 5 MG tablet TAKE ONE-HALF (1/2) TABLET BY MOUTH DAILY 10/25/15  Yes Abner Greenspan, MD  buPROPion (WELLBUTRIN SR) 150 MG 12 hr tablet Take 300 mg by mouth daily.    Yes Historical Provider, MD  Cholecalciferol (VITAMIN D3) 1000 units CAPS Take 2,000 Units by mouth daily.   Yes Historical Provider, MD  gabapentin (NEURONTIN) 100 MG capsule Take 1-3 capsules by mouth at bedtime for RLS 03/07/14  Yes Historical Provider, MD  HYDROcodone-acetaminophen (NORCO/VICODIN) 5-325 MG tablet Take one tablet by mouth every 4-6 hours as needed for pain 10/25/15  Yes Historical Provider, MD  pantoprazole (PROTONIX) 40 MG tablet Take 1 tablet (40 mg total) by mouth daily. 10/25/15  Yes Abner Greenspan, MD  triamterene-hydrochlorothiazide (DYAZIDE) 37.5-25 MG capsule TAKE 1  CAPSULE BY MOUTH EVERY DAY. 10/25/15  Yes Abner Greenspan, MD  vitamin B-12 (CYANOCOBALAMIN) 1000 MCG tablet Take 1,000 mcg by mouth daily.   Yes Historical Provider, MD    Family History  Problem Relation Age of Onset  . Coronary artery disease Father   . Hypertension Father   . Heart disease Father     CAD  . Stroke Mother   . Hypertension Sister   . Hyperlipidemia Sister   . Ovarian cancer Maternal Grandmother   . Colon cancer Neg Hx   . Esophageal cancer Neg Hx   . Rectal cancer Neg Hx   . Stomach cancer Neg Hx      Social History  Substance Use Topics  . Smoking status: Never Smoker  . Smokeless tobacco: Never Used  . Alcohol use Yes     Comment: social wine     Allergies as of 12/06/2015  . (No Known Allergies)    Review of Systems:    All systems reviewed and negative except where noted in HPI.   Physical Exam:  BP (!) 153/77 (BP Location: Right Arm, Patient Position: Sitting, Cuff Size: Normal)   Pulse 74   Temp 98.1 F (36.7 C) (Oral)   Ht 5\' 2"  (1.575 m)   Wt 151 lb (68.5 kg)   BMI 27.62 kg/m  No LMP recorded. Patient has had a hysterectomy. Psych:  Alert and cooperative. Normal mood and affect. General:   Alert,  Well-developed, well-nourished, pleasant and cooperative in NAD Head:  Normocephalic and atraumatic. Eyes:  Sclera clear, no icterus.   Conjunctiva pink. Ears:  Normal auditory acuity. Nose:  No deformity, discharge, or lesions. Mouth:  No deformity or lesions,oropharynx pink & moist. Neck:  Supple; no masses or thyromegaly. Lungs:  Respirations even and unlabored.  Clear throughout to auscultation.   No wheezes, crackles, or rhonchi. No acute distress. Heart:  Regular rate and rhythm; no murmurs, clicks, rubs, or gallops. Abdomen:  Normal bowel sounds.  No bruits.  Soft, non-tender and non-distended without masses, hepatosplenomegaly or hernias noted.  No guarding or rebound tenderness.  Negative Carnett sign.   Rectal:  Deferred.  Msk:   Symmetrical without gross deformities.  Good, equal movement & strength bilaterally. Pulses:  Normal pulses noted. Extremities:  No clubbing or edema.  No cyanosis. Neurologic:  Alert and oriented x3;  grossly normal neurologically. Skin:  Intact without significant lesions or rashes.  No jaundice. Lymph Nodes:  No significant cervical adenopathy. Psych:  Alert and cooperative. Normal mood and affect.  Imaging Studies: No results found.  Assessment  and Plan:   Jodi Bartlett is a 80 y.o. y/o female who has a long-standing history of GI symptoms. The patient had been followed in [place] with a gastrologist but did not want to travel there anymore. The patient was then told to be seen at the [facility] clinic but wanted something and [place] therefore she was sent to me. The patient has symptoms consistent with irritable bowel syndrome with her report of bloating passing mucus in her stools, feeling better after she moves her bowels and worse with stress. She has been told to increase fiber in her diet with Citrucel once to twice a day. She has also been told to take her Protonix at night so that she gets most of her acid suppression while she is laying down and he may have less symptoms in the morning. The patient has also been told to avoid dairy products for one week to see if she is feeling better. The patient has been explained the plan and that she has symptoms consistent with irritable bowel syndrome. She has been told to follow up as needed.   Note: This dictation was prepared with Dragon dictation along with smaller phrase technology. Any transcriptional errors that result from this process are unintentional.

## 2015-12-19 DIAGNOSIS — M1611 Unilateral primary osteoarthritis, right hip: Secondary | ICD-10-CM | POA: Diagnosis not present

## 2016-01-01 ENCOUNTER — Encounter: Payer: Self-pay | Admitting: Internal Medicine

## 2016-01-01 ENCOUNTER — Ambulatory Visit (INDEPENDENT_AMBULATORY_CARE_PROVIDER_SITE_OTHER): Payer: PPO | Admitting: Internal Medicine

## 2016-01-01 VITALS — BP 138/82 | HR 71 | Resp 16 | Ht 62.0 in | Wt 140.8 lb

## 2016-01-01 DIAGNOSIS — M48061 Spinal stenosis, lumbar region without neurogenic claudication: Secondary | ICD-10-CM

## 2016-01-01 DIAGNOSIS — K2100 Gastro-esophageal reflux disease with esophagitis, without bleeding: Secondary | ICD-10-CM

## 2016-01-01 DIAGNOSIS — R918 Other nonspecific abnormal finding of lung field: Secondary | ICD-10-CM

## 2016-01-01 DIAGNOSIS — Z1231 Encounter for screening mammogram for malignant neoplasm of breast: Secondary | ICD-10-CM

## 2016-01-01 DIAGNOSIS — Z23 Encounter for immunization: Secondary | ICD-10-CM

## 2016-01-01 DIAGNOSIS — M160 Bilateral primary osteoarthritis of hip: Secondary | ICD-10-CM | POA: Diagnosis not present

## 2016-01-01 DIAGNOSIS — K222 Esophageal obstruction: Secondary | ICD-10-CM

## 2016-01-01 DIAGNOSIS — E538 Deficiency of other specified B group vitamins: Secondary | ICD-10-CM | POA: Diagnosis not present

## 2016-01-01 DIAGNOSIS — I10 Essential (primary) hypertension: Secondary | ICD-10-CM | POA: Diagnosis not present

## 2016-01-01 DIAGNOSIS — K589 Irritable bowel syndrome without diarrhea: Secondary | ICD-10-CM | POA: Diagnosis not present

## 2016-01-01 DIAGNOSIS — E559 Vitamin D deficiency, unspecified: Secondary | ICD-10-CM | POA: Diagnosis not present

## 2016-01-01 NOTE — Progress Notes (Signed)
Date:  01/01/2016   Name:  Jodi Bartlett   DOB:  08/08/1935   MRN:  [id number]   Chief Complaint: Establish Care Hypertension  This is a chronic problem. The current episode started more than 1 year ago. The problem is controlled. Pertinent negatives include no chest pain, headaches, palpitations or shortness of breath. Past treatments include beta blockers and diuretics. The current treatment provides significant improvement.  Gastroesophageal Reflux  She complains of abdominal pain and heartburn. She reports no chest pain, no coughing or no wheezing. The problem occurs frequently. Pertinent negatives include no fatigue. She has tried a PPI for the symptoms. Past procedures include an EGD.  Back Pain  This is a chronic problem. The current episode started more than 1 year ago. The problem occurs constantly. The problem has been gradually worsening since onset. The pain is present in the lumbar spine. Associated symptoms include abdominal pain. Pertinent negatives include no chest pain, fever, headaches or numbness. The treatment provided mild relief.     Review of Systems  Constitutional: Negative for chills, fatigue and fever.  Eyes: Negative for visual disturbance.  Respiratory: Negative for cough, chest tightness, shortness of breath and wheezing.   Cardiovascular: Negative for chest pain, palpitations and leg swelling.  Gastrointestinal: Positive for abdominal pain and heartburn.  Genitourinary: Negative for difficulty urinating.  Musculoskeletal: Positive for arthralgias and back pain.  Skin: Negative for color change and rash.  Neurological: Negative for dizziness, tremors, numbness and headaches.  Psychiatric/Behavioral: Positive for sleep disturbance (due to pain). Negative for dysphoric mood.    Patient Active Problem List   Diagnosis Date Noted  . Elevated serum creatinine 11/08/2015  . Leukocytosis 10/30/2015  . Fatigue 10/30/2015  . Exercise intolerance 10/30/2015    . Dizziness 10/30/2015  . Low back pain 03/16/2014  . H/O finger fracture 05/14/2013  . Lumbar sprain 12/03/2012  . Insomnia 07/17/2012  . Lung mass 04/14/2012  . Lung abnormality 01/21/2012  . Anemia 11/13/2011  . DIVERTICULOSIS-COLON 05/29/2009  . ULCERATIVE COLITIS, HX OF 11/10/2008  . Vitamin D deficiency 10/11/2008  . B12 deficiency 10/04/2008  . BUNION 10/19/2007  . ESOPHAGEAL STRICTURE 10/13/2007  . IBS 10/01/2007  . Generalized anxiety disorder 07/31/2007  . Anxiety and depression 07/31/2007  . Essential hypertension 07/31/2007  . GERD 07/31/2007  . COLONIC POLYPS, HX OF 07/31/2007    Prior to Admission medications   Medication Sig Start Date End Date Taking? Authorizing Provider  ALPRAZolam (XANAX) 0.25 MG tablet Take 0.25 mg by mouth at bedtime as needed.    Yes Historical Provider, MD  bisoprolol (ZEBETA) 5 MG tablet TAKE ONE-HALF (1/2) TABLET BY MOUTH DAILY 10/25/15  Yes Abner Greenspan, MD  buPROPion (WELLBUTRIN SR) 150 MG 12 hr tablet Take 300 mg by mouth daily.    Yes Historical Provider, MD  Cholecalciferol (VITAMIN D3) 1000 units CAPS Take 2,000 Units by mouth daily.   Yes Historical Provider, MD  gabapentin (NEURONTIN) 100 MG capsule Take 1-3 capsules by mouth at bedtime for RLS 03/07/14  Yes Historical Provider, MD  HYDROcodone-acetaminophen (NORCO/VICODIN) 5-325 MG tablet Take by mouth. 12/11/15  Yes Historical Provider, MD  Naproxen Sodium (ALEVE PO) Take by mouth.   Yes Historical Provider, MD  pantoprazole (PROTONIX) 40 MG tablet Take 1 tablet (40 mg total) by mouth daily. 10/25/15  Yes Abner Greenspan, MD  triamterene-hydrochlorothiazide (DYAZIDE) 37.5-25 MG capsule TAKE 1 CAPSULE BY MOUTH EVERY DAY. 10/25/15  Yes Abner Greenspan, MD  vitamin  B-12 (CYANOCOBALAMIN) 1000 MCG tablet Take 1,000 mcg by mouth daily.   Yes Historical Provider, MD    No Known Allergies  Past Surgical History:  Procedure Laterality Date  . APPENDECTOMY    . breast biopsy- benign    .  bunion/morton's neuroma  4/07   right foot   . carpal tunnel release-right    . CHOLECYSTECTOMY  1994  . colitis transfusion     hosp at 25  . COLONOSCOPY     very difficul- supp with BE past  . CT HEAD (other)  6/10   small vessel chronic change/ no acute findings  . EGD tight ues sphincter  7/09   with dialtion  . FOOT SURGERY  10/09   TMT fusion and bunionectomy  . HEMORRHOID SURGERY    . hysterectomy (other)     age 80; ovarian cancer (1962)  . INCISIONAL HERNIA REPAIR     after ccy  . left knee surgery    . left shoulder replacement    . right shoulder surgery    . stress cardiolite  9/10  . VIDEO BRONCHOSCOPY  04/22/2012   Procedure: VIDEO BRONCHOSCOPY WITH FLUORO;  Surgeon: Juanito Doom, MD;  Location: [facility];  Service: Cardiopulmonary;  Laterality: Bilateral;    Social History  Substance Use Topics  . Smoking status: Never Smoker  . Smokeless tobacco: Never Used  . Alcohol use Yes     Comment: social wine      Medication list has been reviewed and updated.   Physical Exam  Constitutional: She is oriented to person, place, and time. She appears well-developed. No distress.  HENT:  Head: Normocephalic and atraumatic.  Cardiovascular: Normal rate, regular rhythm and normal heart sounds.   Pulmonary/Chest: Effort normal and breath sounds normal. No respiratory distress. She has no wheezes.  Abdominal: Soft. Bowel sounds are normal.  Neurological: She is alert and oriented to person, place, and time.  Skin: Skin is warm and dry. No rash noted.  Psychiatric: She has a normal mood and affect. Her behavior is normal. Thought content normal.  Nursing note and vitals reviewed.   BP 138/82   Pulse 71   Resp 16   Ht 5\' 2"  (1.575 m)   Wt 140 lb 12.8 oz (63.9 kg)   SpO2 99%   BMI 25.75 kg/m   Assessment and Plan: 1. Essential hypertension Controlled Schedule followup CPX and labs  2. ESOPHAGEAL STRICTURE Continue PPI - may need to go to  bid Followed Dr. Allen Norris  3. Gastroesophageal reflux disease with esophagitis On PPI  4. Degenerative lumbar spinal stenosis Needs Neurosurgical consultation  5. Primary osteoarthritis of both hips S/p steroid injection without much benefit  6. Encounter for screening mammogram for breast cancer - MM DIGITAL SCREENING BILATERAL; Future  7. Need for influenza vaccination - Flu Vaccine QUAD 36+ mos IM   Halina Maidens, MD [employer] [facility]  01/01/2016

## 2016-01-01 NOTE — Patient Instructions (Signed)
Breast Self-Awareness Practicing breast self-awareness may pick up problems early, prevent significant medical complications, and possibly save your life. By practicing breast self-awareness, you can become familiar with how your breasts look and feel and if your breasts are changing. This allows you to notice changes early. It can also offer you some reassurance that your breast health is good. One way to learn what is normal for your breasts and whether your breasts are changing is to do a breast self-exam. If you find a lump or something that was not present in the past, it is best to contact your caregiver right away. Other findings that should be evaluated by your caregiver include nipple discharge, especially if it is bloody; skin changes or reddening; areas where the skin seems to be pulled in (retracted); or new lumps and bumps. Breast pain is seldom associated with cancer (malignancy), but should also be evaluated by a caregiver. HOW TO PERFORM A BREAST SELF-EXAM The best time to examine your breasts is 5-7 days after your menstrual period is over. During menstruation, the breasts are lumpier, and it may be more difficult to pick up changes. If you do not menstruate, have reached menopause, or had your uterus removed (hysterectomy), you should examine your breasts at regular intervals, such as monthly. If you are breastfeeding, examine your breasts after a feeding or after using a breast pump. Breast implants do not decrease the risk for lumps or tumors, so continue to perform breast self-exams as recommended. Talk to your caregiver about how to determine the difference between the implant and breast tissue. Also, talk about the amount of pressure you should use during the exam. Over time, you will become more familiar with the variations of your breasts and more comfortable with the exam. A breast self-exam requires you to remove all your clothes above the waist. 1. Look at your breasts and nipples.  Stand in front of a mirror in a room with good lighting. With your hands on your hips, push your hands firmly downward. Look for a difference in shape, contour, and size from one breast to the other (asymmetry). Asymmetry includes puckers, dips, or bumps. Also, look for skin changes, such as reddened or scaly areas on the breasts. Look for nipple changes, such as discharge, dimpling, repositioning, or redness. 2. Carefully feel your breasts. This is best done either in the shower or tub while using soapy water or when flat on your back. Place the arm (on the side of the breast you are examining) above your head. Use the pads (not the fingertips) of your three middle fingers on your opposite hand to feel your breasts. Start in the underarm area and use  inch (2 cm) overlapping circles to feel your breast. Use 3 different levels of pressure (light, medium, and firm pressure) at each circle before moving to the next circle. The light pressure is needed to feel the tissue closest to the skin. The medium pressure will help to feel breast tissue a little deeper, while the firm pressure is needed to feel the tissue close to the ribs. Continue the overlapping circles, moving downward over the breast until you feel your ribs below your breast. Then, move one finger-width towards the center of the body. Continue to use the  inch (2 cm) overlapping circles to feel your breast as you move slowly up toward the collar bone (clavicle) near the base of the neck. Continue the up and down exam using all 3 pressures until you reach the   middle of the chest. Do this with each breast, carefully feeling for lumps or changes. 3.  Keep a written record with breast changes or normal findings for each breast. By writing this information down, you do not need to depend only on memory for size, tenderness, or location. Write down where you are in your menstrual cycle, if you are still menstruating. Breast tissue can have some lumps or  thick tissue. However, see your caregiver if you find anything that concerns you.  SEEK MEDICAL CARE IF:  You see a change in shape, contour, or size of your breasts or nipples.   You see skin changes, such as reddened or scaly areas on the breasts or nipples.   You have an unusual discharge from your nipples.   You feel a new lump or unusually thick areas.    This information is not intended to replace advice given to you by your health care provider. Make sure you discuss any questions you have with your health care provider.   Document Released: 03/18/2005 Document Revised: 03/04/2012 Document Reviewed: 07/03/2011 Elsevier Interactive Patient Education 2016 [employer]  

## 2016-01-16 DIAGNOSIS — M48061 Spinal stenosis, lumbar region without neurogenic claudication: Secondary | ICD-10-CM | POA: Diagnosis not present

## 2016-01-16 DIAGNOSIS — M7062 Trochanteric bursitis, left hip: Secondary | ICD-10-CM | POA: Diagnosis not present

## 2016-01-16 DIAGNOSIS — M5136 Other intervertebral disc degeneration, lumbar region: Secondary | ICD-10-CM | POA: Diagnosis not present

## 2016-01-16 DIAGNOSIS — M4697 Unspecified inflammatory spondylopathy, lumbosacral region: Secondary | ICD-10-CM | POA: Diagnosis not present

## 2016-01-16 DIAGNOSIS — M7061 Trochanteric bursitis, right hip: Secondary | ICD-10-CM | POA: Diagnosis not present

## 2016-01-17 ENCOUNTER — Ambulatory Visit: Admission: RE | Admit: 2016-01-17 | Payer: PPO | Source: Ambulatory Visit

## 2016-01-22 ENCOUNTER — Other Ambulatory Visit: Payer: Self-pay | Admitting: Internal Medicine

## 2016-01-22 MED ORDER — PANTOPRAZOLE SODIUM 40 MG PO TBEC: 40.0000 mg | DELAYED_RELEASE_TABLET | Freq: Two times a day (BID) | ORAL | 1 refills | Status: DC

## 2016-01-22 MED ORDER — PANTOPRAZOLE SODIUM 40 MG PO TBEC: 40.0000 mg | DELAYED_RELEASE_TABLET | Freq: Every day | ORAL | 1 refills | Status: DC

## 2016-01-26 ENCOUNTER — Other Ambulatory Visit: Payer: Self-pay | Admitting: Internal Medicine

## 2016-01-26 DIAGNOSIS — Z1231 Encounter for screening mammogram for malignant neoplasm of breast: Secondary | ICD-10-CM

## 2016-01-30 ENCOUNTER — Ambulatory Visit
Admission: RE | Admit: 2016-01-30 | Discharge: 2016-01-30 | Disposition: A | Discharge status: Home / Self Care | Payer: PPO | Source: Ambulatory Visit | Attending: Internal Medicine | Admitting: Internal Medicine

## 2016-01-30 DIAGNOSIS — Z1231 Encounter for screening mammogram for malignant neoplasm of breast: Secondary | ICD-10-CM | POA: Diagnosis not present

## 2016-01-30 HISTORY — DX: Personal history of antineoplastic chemotherapy: Z92.21

## 2016-02-09 ENCOUNTER — Telehealth: Payer: Self-pay

## 2016-02-09 NOTE — Telephone Encounter (Signed)
Patient called in left vmail requesting mammogram results.  She received the results letter from the imaging center, wanted to confirm results normal. Spoke to patient confirmed results normal per Dr. Army Melia.  Patient verbalized understanding.

## 2016-02-28 DIAGNOSIS — F331 Major depressive disorder, recurrent, moderate: Secondary | ICD-10-CM | POA: Diagnosis not present

## 2016-05-21 ENCOUNTER — Encounter: Payer: Self-pay | Admitting: Internal Medicine

## 2016-05-21 ENCOUNTER — Other Ambulatory Visit: Payer: Self-pay | Admitting: Internal Medicine

## 2016-05-21 ENCOUNTER — Ambulatory Visit (INDEPENDENT_AMBULATORY_CARE_PROVIDER_SITE_OTHER): Payer: PPO | Admitting: Internal Medicine

## 2016-05-21 VITALS — BP 122/64 | HR 80 | Resp 14 | Ht 63.0 in | Wt 153.0 lb

## 2016-05-21 DIAGNOSIS — Z23 Encounter for immunization: Secondary | ICD-10-CM

## 2016-05-21 DIAGNOSIS — K2100 Gastro-esophageal reflux disease with esophagitis, without bleeding: Secondary | ICD-10-CM

## 2016-05-21 DIAGNOSIS — M160 Bilateral primary osteoarthritis of hip: Secondary | ICD-10-CM

## 2016-05-21 DIAGNOSIS — I10 Essential (primary) hypertension: Secondary | ICD-10-CM

## 2016-05-21 DIAGNOSIS — Z860100 Personal history of colon polyps, unspecified: Secondary | ICD-10-CM

## 2016-05-21 DIAGNOSIS — Z0000 Encounter for general adult medical examination without abnormal findings: Secondary | ICD-10-CM | POA: Diagnosis not present

## 2016-05-21 MED ORDER — PANTOPRAZOLE SODIUM 40 MG PO TBEC: 40.0000 mg | DELAYED_RELEASE_TABLET | Freq: Two times a day (BID) | ORAL | 3 refills | Status: DC

## 2016-05-21 MED ORDER — BISOPROLOL FUMARATE 5 MG PO TABS: ORAL_TABLET | ORAL | 3 refills | Status: DC

## 2016-05-21 MED ORDER — TRIAMTERENE-HCTZ 37.5-25 MG PO CAPS: ORAL_CAPSULE | ORAL | 3 refills | Status: DC

## 2016-05-21 NOTE — Patient Instructions (Signed)
Health Maintenance  Topic Date Due  . DEXA SCAN  01/13/2003  . PNA vac Low Risk Adult (2 of 2 - PCV13) completed  . MAMMOGRAM  01/29/2017  . TETANUS/TDAP  11/17/2017  . INFLUENZA VACCINE  Completed

## 2016-05-21 NOTE — Progress Notes (Signed)
Patient: Jodi Bartlett, Female    DOB: [birth year], 81 y.o.   MRN: [id number] Visit Date: 05/21/2016  Today's Provider: Halina Maidens, MD   Chief Complaint  Patient presents with  . Annual Exam    w/ no pap   Subjective:    Annual wellness visit Jodi Bartlett is a 81 y.o. female who presents today for her Subsequent Annual Wellness Visit. She feels fairly well. Much less hip pain since getting joint injections.  She reports exercising little but walking. She reports she is sleeping fairly well.   ----------------------------------------------------------- Hypertension  This is a chronic problem. The problem is unchanged. The problem is controlled. Pertinent negatives include no chest pain, headaches, palpitations or shortness of breath.  Gastroesophageal Reflux  She complains of heartburn. She reports no abdominal pain, no chest pain, no coughing or no wheezing. This is a recurrent problem. The problem occurs occasionally. Pertinent negatives include no fatigue. She has tried a PPI for the symptoms. The treatment provided moderate relief.  Back and hip pain - had hip injections and feels much better. Sleeping better as well.  Review of Systems  Constitutional: Negative for chills, fatigue and fever.  HENT: Negative for congestion, hearing loss, tinnitus, trouble swallowing and voice change.   Eyes: Negative for visual disturbance.  Respiratory: Negative for cough, chest tightness, shortness of breath and wheezing.   Cardiovascular: Negative for chest pain, palpitations and leg swelling.  Gastrointestinal: Positive for heartburn. Negative for abdominal pain, constipation, diarrhea and vomiting.  Endocrine: Negative for polydipsia and polyuria.  Genitourinary: Negative for dysuria, frequency, genital sores, vaginal bleeding and vaginal discharge.  Musculoskeletal: Negative for gait problem and joint swelling.  Skin: Negative for color change and rash.  Neurological: Negative for  dizziness, tremors, light-headedness and headaches.  Hematological: Negative for adenopathy. Does not bruise/bleed easily.  Psychiatric/Behavioral: Negative for dysphoric mood and sleep disturbance. The patient is not nervous/anxious.     Social History   Social History  . Marital status: Widowed    Spouse name: N/A  . Number of children: 1  . Years of education: N/A   Occupational History  . COSMETOLOGIST     Retired    Social History Main Topics  . Smoking status: Never Smoker  . Smokeless tobacco: Never Used  . Alcohol use Yes     Comment: social wine   . Drug use: No  . Sexual activity: Not on file   Other Topics Concern  . Not on file   Social History Narrative   1 daughter. 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    Daily caffeine     Patient Active Problem List   Diagnosis Date Noted  . Degenerative lumbar spinal stenosis 01/01/2016  . Primary osteoarthritis of both hips 01/01/2016  . Fatigue 10/30/2015  . Exercise intolerance 10/30/2015  . Dizziness 10/30/2015  . Insomnia 07/17/2012  . Abnormality of lung on CXR 01/21/2012  . Anemia 11/13/2011  . DIVERTICULOSIS-COLON 05/29/2009  . Vitamin D deficiency 10/11/2008  . B12 deficiency 10/04/2008  . BUNION 10/19/2007  . ESOPHAGEAL STRICTURE 10/13/2007  . IBS 10/01/2007  . Anxiety and depression 07/31/2007  . Essential hypertension 07/31/2007  . GERD 07/31/2007  . COLONIC POLYPS, HX OF 07/31/2007    Past Surgical History:  Procedure Laterality Date  . ABDOMINAL HYSTERECTOMY    . APPENDECTOMY    . breast biopsy- benign    . BREAST CYST EXCISION Left   . bunion/morton's neuroma  4/07  right foot   . carpal tunnel release-right    . CHOLECYSTECTOMY  1994  . colitis transfusion     hosp at 25  . COLONOSCOPY     very difficul- supp with BE past  . CT HEAD (other)  6/10   small vessel chronic change/ no acute findings  . EGD tight ues sphincter  7/09   with dialtion  . FOOT  SURGERY  10/09   TMT fusion and bunionectomy  . HEMORRHOID SURGERY    . hysterectomy (other)     age 46; ovarian cancer (1962)  . INCISIONAL HERNIA REPAIR     after ccy  . left knee surgery    . left shoulder replacement    . OOPHORECTOMY Bilateral 1962  . right shoulder surgery    . stress cardiolite  9/10  . VIDEO BRONCHOSCOPY  04/22/2012   Procedure: VIDEO BRONCHOSCOPY WITH FLUORO;  Surgeon: Juanito Doom, MD;  Location: [facility];  Service: Cardiopulmonary;  Laterality: Bilateral;    Her family history includes Coronary artery disease in her father; Heart disease in her father; Hyperlipidemia in her sister; Hypertension in her father and sister; Ovarian cancer in her maternal grandmother; Stroke in her mother.     Previous Medications   ACETAMINOPHEN (TYLENOL) 500 MG TABLET    Take 500 mg by mouth every 6 (six) hours as needed.   ALPRAZOLAM (XANAX) 0.25 MG TABLET    Take 0.25 mg by mouth at bedtime as needed.    AMITRIPTYLINE (ELAVIL) 50 MG TABLET    Take 50 mg by mouth at bedtime.   BISOPROLOL (ZEBETA) 5 MG TABLET    TAKE ONE-HALF (1/2) TABLET BY MOUTH DAILY   BUPROPION (WELLBUTRIN SR) 150 MG 12 HR TABLET    Take 300 mg by mouth daily.    CHOLECALCIFEROL (VITAMIN D3) 1000 UNITS CAPS    Take 2,000 Units by mouth daily.   GABAPENTIN (NEURONTIN) 100 MG CAPSULE    Take 1-3 capsules by mouth at bedtime for RLS   HYDROCODONE-ACETAMINOPHEN (NORCO/VICODIN) 5-325 MG TABLET    Take by mouth.   NAPROXEN SODIUM (ALEVE PO)    Take by mouth.   PANTOPRAZOLE (PROTONIX) 40 MG TABLET    Take 1 tablet (40 mg total) by mouth 2 (two) times daily.   TRIAMTERENE-HYDROCHLOROTHIAZIDE (DYAZIDE) 37.5-25 MG CAPSULE    TAKE 1 CAPSULE BY MOUTH EVERY DAY.   VITAMIN B-12 (CYANOCOBALAMIN) 1000 MCG TABLET    Take 1,000 mcg by mouth daily.    Patient Care Team: Provider Not In System as PCP - General Hessie Knows, MD as Consulting Physician (Orthopedic Surgery) Lucilla Lame, MD as Consulting Physician  (Gastroenterology) Duanne Guess, PA-C as Physician Assistant (Orthopedic Surgery) Lew Dawes, MD as Attending Physician (Psychiatry) Sharlet Salina, MD as Referring Physician (Physical Medicine and Rehabilitation)      Objective:   Vitals: BP 122/64   Pulse 80   Resp 14   Ht 5\' 3"  (1.6 m)   Wt 153 lb (69.4 kg)   BMI 27.10 kg/m   Physical Exam  Constitutional: She is oriented to person, place, and time. She appears well-developed and well-nourished. No distress.  HENT:  Head: Normocephalic and atraumatic.  Right Ear: Tympanic membrane and ear canal normal.  Left Ear: Tympanic membrane and ear canal normal.  Nose: Right sinus exhibits no maxillary sinus tenderness. Left sinus exhibits no maxillary sinus tenderness.  Mouth/Throat: Uvula is midline and oropharynx is clear and moist.  Eyes: Conjunctivae and EOM  are normal. Right eye exhibits no discharge. Left eye exhibits no discharge. No scleral icterus.  Neck: Normal range of motion. Carotid bruit is not present. No erythema present. No thyromegaly present.  Cardiovascular: Normal rate, regular rhythm, normal heart sounds, intact distal pulses and normal pulses.   Pulmonary/Chest: Effort normal. No respiratory distress. She has no wheezes. Right breast exhibits no mass, no nipple discharge, no skin change and no tenderness. Left breast exhibits no mass, no nipple discharge, no skin change and no tenderness.  Abdominal: Soft. Bowel sounds are normal. There is no hepatosplenomegaly. There is no tenderness. There is no CVA tenderness.  Musculoskeletal: She exhibits no edema.       Left shoulder: She exhibits decreased range of motion.  Lymphadenopathy:    She has no cervical adenopathy.    She has no axillary adenopathy.  Neurological: She is alert and oriented to person, place, and time. She has normal reflexes. No cranial nerve deficit or sensory deficit.  Skin: Skin is warm, dry and intact. No rash noted.  Psychiatric: She  has a normal mood and affect. Her speech is normal and behavior is normal. Thought content normal. Cognition and memory are normal.  Nursing note and vitals reviewed.   Activities of Daily Living In your present state of health, do you have any difficulty performing the following activities: 01/01/2016  Hearing? N  Vision? N  Difficulty concentrating or making decisions? N  Walking or climbing stairs? Y  Dressing or bathing? N  Doing errands, shopping? N  Some recent data might be hidden    Fall Risk Assessment Fall Risk  01/01/2016 08/03/2013  Falls in the past year? No Yes  Number falls in past yr: - 1  Injury with Fall? - No  Risk for fall due to : Other (Comment) -  Some recent data might be hidden    Depression Screen PHQ 2/9 Scores 01/01/2016 08/03/2013  PHQ - 2 Score 0 0  Some recent data might be hidden   6CIT Screen 05/21/2016  What Year? 0 points  What month? 0 points  What time? 0 points  Count back from 20 0 points  Months in reverse 0 points  Repeat phrase 0 points  Total Score 0  Some recent data might be hidden      Medicare Annual Wellness Visit Summary:  Reviewed patient's Family Medical History Reviewed and updated list of patient's medical providers Assessment of cognitive impairment was done Assessed patient's functional ability Established a written schedule for health screening [place] Completed and Reviewed  Exercise Activities and Dietary recommendations Goals    None      Immunization History  Administered Date(s) Administered  . Influenza Split 01/21/2012  . Influenza,inj,Quad PF,36+ Mos 03/16/2014, 01/01/2016  . Pneumococcal Polysaccharide-23 08/03/2013  . Td 04/01/1994, 11/18/2007    Health Maintenance  Topic Date Due  . DEXA SCAN  01/13/2003  . PNA vac Low Risk Adult (2 of 2 - PCV13) 12/31/2016 (Originally 08/04/2014)  . MAMMOGRAM  01/29/2017  . TETANUS/TDAP  11/17/2017  . INFLUENZA VACCINE  Completed     Discussed health benefits of physical activity, and encouraged her to engage in regular exercise appropriate for her age and condition.    ------------------------------------------------------------------------------------------------------------  Assessment & Plan:   1. Medicare annual wellness visit, subsequent Measures satisified - Lipid panel - POCT urinalysis dipstick  2. Essential hypertension controlled - Comprehensive metabolic panel - TSH  3. Gastroesophageal reflux disease with esophagitis Hx of mild  anemia - will check CBC Continue PPI - CBC with Differential/Platelet  4. History of colonic polyps  5. Need for pneumococcal vaccination - Pneumococcal conjugate vaccine 13-valent IM  6. Primary osteoarthritis of both hips Improved with Orthopedic care, injections Continue tylenol as needed   Meds ordered this encounter  Medications  . bisoprolol (ZEBETA) 5 MG tablet    Sig: TAKE ONE-HALF (1/2) TABLET BY MOUTH DAILY    Dispense:  45 tablet    Refill:  3  . triamterene-hydrochlorothiazide (DYAZIDE) 37.5-25 MG capsule    Sig: TAKE 1 CAPSULE BY MOUTH EVERY DAY.    Dispense:  90 capsule    Refill:  3  . pantoprazole (PROTONIX) 40 MG tablet    Sig: Take 1 tablet (40 mg total) by mouth 2 (two) times daily.    Dispense:  180 tablet    Refill:  [place], MD [facility]  05/21/2016

## 2016-05-24 LAB — TSH: TSH: 5.95 u[IU]/mL — AB (ref 0.450–4.500)

## 2016-05-24 LAB — LIPID PANEL
CHOLESTEROL TOTAL: 185 mg/dL (ref 100–199)
Chol/HDL Ratio: 3.3 (ref 0.0–4.4)
HDL: 56 mg/dL (ref >39)
LDL Calculated: 99 (ref 0–99)
Triglycerides: 151 mg/dL — ABNORMAL HIGH (ref 0–149)
VLDL Cholesterol Cal: 30 (ref 5–40)

## 2016-05-24 LAB — CBC WITH DIFFERENTIAL/PLATELET
BASOS ABS: 0 10*3/uL (ref 0.0–0.2)
BASOS: 0 %
EOS (ABSOLUTE): 0.1 10*3/uL (ref 0.0–0.4)
Eos: 1 %
Hematocrit: 35.4 % (ref 34.0–46.6)
Hemoglobin: 11.4 g/dL (ref 11.1–15.9)
Immature Grans (Abs): 0 10*3/uL (ref 0.0–0.1)
Immature Granulocytes: 0 %
LYMPHS ABS: 1.9 10*3/uL (ref 0.7–3.1)
Lymphs: 27 %
MCH: 30.6 pg (ref 26.6–33.0)
MCHC: 32.2 g/dL (ref 31.5–35.7)
MCV: 95 fL (ref 79–97)
MONOS ABS: 0.6 10*3/uL (ref 0.1–0.9)
Monocytes: 9 %
NEUTROS ABS: 4.4 10*3/uL (ref 1.4–7.0)
Neutrophils: 63 %
PLATELETS: 347 10*3/uL (ref 150–379)
RBC: 3.73 x10E6/uL — ABNORMAL LOW (ref 3.77–5.28)
RDW: 15.2 % (ref 12.3–15.4)
WBC: 7 10*3/uL (ref 3.4–10.8)

## 2016-05-24 LAB — COMPREHENSIVE METABOLIC PANEL
A/G RATIO: 2.1 (ref 1.2–2.2)
ALK PHOS: 77 IU/L (ref 39–117)
ALT: 17 IU/L (ref 0–32)
AST: 19 IU/L (ref 0–40)
Albumin: 4.6 g/dL (ref 3.5–4.8)
BILIRUBIN TOTAL: 0.3 mg/dL (ref 0.0–1.2)
BUN/Creatinine Ratio: 19 (ref 12–28)
BUN: 18 mg/dL (ref 8–27)
CHLORIDE: 97 mmol/L (ref 96–106)
CO2: 26 mmol/L (ref 18–29)
Calcium: 9.9 mg/dL (ref 8.7–10.3)
Creatinine, Ser: 0.97 mg/dL (ref 0.57–1.00)
GFR calc non Af Amer: 56 — ABNORMAL LOW (ref >59)
GFR, EST AFRICAN AMERICAN: 65 (ref >59)
Globulin, Total: 2.2 (ref 1.5–4.5)
Glucose: 98 mg/dL (ref 65–99)
POTASSIUM: 4.7 mmol/L (ref 3.5–5.2)
Sodium: 141 mmol/L (ref 134–144)
TOTAL PROTEIN: 6.8 g/dL (ref 6.0–8.5)

## 2016-05-29 DIAGNOSIS — F331 Major depressive disorder, recurrent, moderate: Secondary | ICD-10-CM | POA: Diagnosis not present

## 2016-10-24 DIAGNOSIS — H43813 Vitreous degeneration, bilateral: Secondary | ICD-10-CM | POA: Diagnosis not present

## 2016-11-18 ENCOUNTER — Ambulatory Visit (INDEPENDENT_AMBULATORY_CARE_PROVIDER_SITE_OTHER): Payer: PPO | Admitting: Internal Medicine

## 2016-11-18 ENCOUNTER — Encounter: Payer: Self-pay | Admitting: Internal Medicine

## 2016-11-18 VITALS — BP 128/78 | HR 97 | Ht 63.0 in | Wt 152.0 lb

## 2016-11-18 DIAGNOSIS — I10 Essential (primary) hypertension: Secondary | ICD-10-CM

## 2016-11-18 DIAGNOSIS — K2100 Gastro-esophageal reflux disease with esophagitis, without bleeding: Secondary | ICD-10-CM

## 2016-11-18 DIAGNOSIS — K222 Esophageal obstruction: Secondary | ICD-10-CM

## 2016-11-18 DIAGNOSIS — Z23 Encounter for immunization: Secondary | ICD-10-CM

## 2016-11-18 DIAGNOSIS — R7989 Other specified abnormal findings of blood chemistry: Secondary | ICD-10-CM

## 2016-11-18 DIAGNOSIS — Z1231 Encounter for screening mammogram for malignant neoplasm of breast: Secondary | ICD-10-CM

## 2016-11-18 DIAGNOSIS — F329 Major depressive disorder, single episode, unspecified: Secondary | ICD-10-CM | POA: Diagnosis not present

## 2016-11-18 DIAGNOSIS — R946 Abnormal results of thyroid function studies: Secondary | ICD-10-CM | POA: Diagnosis not present

## 2016-11-18 DIAGNOSIS — F419 Anxiety disorder, unspecified: Secondary | ICD-10-CM

## 2016-11-18 NOTE — Progress Notes (Signed)
Date:  11/18/2016   Name:  Jodi Bartlett   DOB:  03/03/1936   MRN:  [id number]   Chief Complaint: Gastroesophageal Reflux and Hypertension  Gastroesophageal Reflux  She complains of heartburn. She reports no abdominal pain, no chest pain, no choking, no sore throat or no wheezing. This is a recurrent problem. The problem occurs occasionally. The heartburn does not wake her from sleep. The symptoms are aggravated by certain foods. Pertinent negatives include no fatigue. The treatment provided significant relief. Past procedures include an EGD. hx of stricture and dilation - about 5 yrs ago.  Hypertension  This is a chronic problem. The problem is unchanged. The problem is controlled. Associated symptoms include headaches. Pertinent negatives include no chest pain or palpitations. Past treatments include diuretics and beta blockers. The current treatment provides significant improvement.  Elevated TSH - noted to mildly elevated last visit.  No meds ordered.  Will need recheck.  She denies fatigue, constipation, edema. Depression - followed by Psychiatry.  Take bupropion, gabapentin and alprazolam. Doing very well.  Very excited about her new great grandson that she often babysits.   Review of Systems  Constitutional: Negative for chills, fatigue and fever.  HENT: Positive for congestion and sinus pressure. Negative for sore throat and trouble swallowing.   Respiratory: Negative for choking, chest tightness and wheezing.   Cardiovascular: Negative for chest pain, palpitations and leg swelling.  Gastrointestinal: Positive for heartburn. Negative for abdominal distention and abdominal pain.  Allergic/Immunologic: Positive for environmental allergies.  Neurological: Positive for headaches. Negative for dizziness and light-headedness.    Patient Active Problem List   Diagnosis Date Noted  . Degenerative lumbar spinal stenosis 01/01/2016  . Primary osteoarthritis of both hips 01/01/2016  .  Fatigue 10/30/2015  . Exercise intolerance 10/30/2015  . Dizziness 10/30/2015  . Insomnia 07/17/2012  . Abnormality of lung on CXR 01/21/2012  . Anemia 11/13/2011  . DIVERTICULOSIS-COLON 05/29/2009  . Vitamin D deficiency 10/11/2008  . B12 deficiency 10/04/2008  . Bunion 10/19/2007  . ESOPHAGEAL STRICTURE 10/13/2007  . IBS 10/01/2007  . Anxiety and depression 07/31/2007  . Essential hypertension 07/31/2007  . GERD 07/31/2007  . History of colonic polyps 07/31/2007    Prior to Admission medications   Medication Sig Start Date End Date Taking? Authorizing Provider  acetaminophen (TYLENOL) 500 MG tablet Take 500 mg by mouth every 6 (six) hours as needed.   Yes [provider]  ALPRAZolam (XANAX) 0.25 MG tablet Take 0.25 mg by mouth at bedtime as needed.    Yes [provider]  amitriptyline (ELAVIL) 50 MG tablet Take 50 mg by mouth at bedtime.   Yes [provider]  bisoprolol (ZEBETA) 5 MG tablet TAKE ONE-HALF (1/2) TABLET BY MOUTH DAILY 05/21/16  Yes Glean Hess, MD  buPROPion [facility] SR) 150 MG 12 hr tablet Take 300 mg by mouth daily.    Yes [provider]  Cholecalciferol (VITAMIN D3) 1000 units CAPS Take 2,000 Units by mouth daily.   Yes [provider]  gabapentin (NEURONTIN) 100 MG capsule Take 1-3 capsules by mouth at bedtime for RLS 03/07/14  Yes [provider]  pantoprazole (PROTONIX) 40 MG tablet Take 1 tablet (40 mg total) by mouth 2 (two) times daily. 05/21/16  Yes Glean Hess, MD  triamterene-hydrochlorothiazide (DYAZIDE) 37.5-25 MG capsule TAKE 1 CAPSULE BY MOUTH EVERY DAY. 05/21/16  Yes Glean Hess, MD  vitamin B-12 (CYANOCOBALAMIN) 1000 MCG tablet Take 1,000 mcg by mouth daily.  Yes [provider]    No Known Allergies  Past Surgical History:  Procedure Laterality Date  . ABDOMINAL HYSTERECTOMY    . APPENDECTOMY    . breast biopsy- benign    . BREAST CYST EXCISION Left   .  bunion/morton's neuroma  4/07   right foot   . carpal tunnel release-right    . CHOLECYSTECTOMY  1994  . colitis transfusion     hosp at 25  . COLONOSCOPY     very difficul- supp with BE past  . CT HEAD (other)  6/10   small vessel chronic change/ no acute findings  . EGD tight ues sphincter  7/09   with dialtion  . FOOT SURGERY  10/09   TMT fusion and bunionectomy  . HEMORRHOID SURGERY    . hysterectomy (other)     age 53; ovarian cancer (1962)  . INCISIONAL HERNIA REPAIR     after ccy  . left knee surgery    . left shoulder replacement    . OOPHORECTOMY Bilateral 1962  . right shoulder surgery    . stress cardiolite  9/10  . VIDEO BRONCHOSCOPY  04/22/2012   Procedure: VIDEO BRONCHOSCOPY WITH FLUORO;  Surgeon: Juanito Doom, MD;  Location: [facility];  Service: Cardiopulmonary;  Laterality: Bilateral;    Social History  Substance Use Topics  . Smoking status: Never Smoker  . Smokeless tobacco: Never Used  . Alcohol use Yes     Comment: social wine      Medication list has been reviewed and updated.   Physical Exam  Constitutional: She is oriented to person, place, and time. She appears well-developed. No distress.  HENT:  Head: Normocephalic and atraumatic.  Neck: Normal range of motion. Neck supple. No thyromegaly present.  Cardiovascular: Normal rate, regular rhythm and normal heart sounds.   Pulmonary/Chest: Effort normal. No respiratory distress. She has no wheezes.  Musculoskeletal: She exhibits no edema or tenderness.  Neurological: She is alert and oriented to person, place, and time.  Skin: Skin is warm and dry. No rash noted.  Psychiatric: She has a normal mood and affect. Her behavior is normal. Thought content normal.  Nursing note and vitals reviewed.   BP 128/78   Pulse 97   Ht 5\' 3"  (1.6 m)   Wt 152 lb (68.9 kg)   SpO2 98%   BMI 26.93 kg/m   Assessment and Plan: 1. Essential hypertension controlled  2. Gastroesophageal reflux  disease with esophagitis Continue PPI  3. ESOPHAGEAL STRICTURE Hx of severe symptoms; not needed evaluation at this time  4. Anxiety and depression Doing well on meds prescribed by Psych  5. Encounter for screening mammogram for breast cancer - MM DIGITAL SCREENING BILATERAL; Future  6. Elevated TSH recheck - TSH  7. Need for zoster vaccination Rx given   No orders of the defined types were placed in this encounter.   Halina Maidens, MD [facility]  11/18/2016

## 2016-11-19 LAB — TSH: TSH: 3.95 u[IU]/mL (ref 0.450–4.500)

## 2016-12-31 ENCOUNTER — Ambulatory Visit (INDEPENDENT_AMBULATORY_CARE_PROVIDER_SITE_OTHER): Payer: PPO | Admitting: Internal Medicine

## 2016-12-31 ENCOUNTER — Encounter: Payer: Self-pay | Admitting: Internal Medicine

## 2016-12-31 VITALS — BP 152/78 | HR 89 | Temp 98.3°F | Ht 63.0 in | Wt 154.0 lb

## 2016-12-31 DIAGNOSIS — J40 Bronchitis, not specified as acute or chronic: Secondary | ICD-10-CM

## 2016-12-31 DIAGNOSIS — S39012A Strain of muscle, fascia and tendon of lower back, initial encounter: Secondary | ICD-10-CM

## 2016-12-31 MED ORDER — DOXYCYCLINE HYCLATE 100 MG PO TABS: 100.0000 mg | ORAL_TABLET | Freq: Two times a day (BID) | ORAL | 0 refills | Status: DC

## 2016-12-31 MED ORDER — GUAIFENESIN-CODEINE 100-10 MG/5ML PO SYRP: 5.0000 mL | ORAL_SOLUTION | Freq: Three times a day (TID) | ORAL | 0 refills | Status: DC | PRN

## 2016-12-31 NOTE — Progress Notes (Signed)
Date:  12/31/2016   Name:  Jodi Bartlett   DOB:  1935-09-24   MRN:  [id number]   Chief Complaint: Sore Throat (Coughing- voice gone. Throat hurts and Started Sunday. Been coughing for 2 weeks. -Some thick green mucous is coming up during cough.)  Sore Throat   This is a new problem. The current episode started in the past 7 days. The problem has been rapidly improving. There has been no fever. Associated symptoms include coughing and diarrhea. Pertinent negatives include no abdominal pain, shortness of breath, swollen glands or trouble swallowing.  Cough  This is a new problem. The current episode started 1 to 4 weeks ago. The problem has been gradually worsening. The problem occurs every few minutes. The cough is productive of sputum. Associated symptoms include wheezing. Pertinent negatives include no chest pain, chills, fever or shortness of breath. The symptoms are aggravated by lying down. She has tried nothing for the symptoms.  Back Pain  This is a new (fell on 12/24/16 at home onto left side) problem. The current episode started in the past 7 days. The pain is present in the lumbar spine and sacro-iliac. The pain does not radiate. The pain is moderate. Pertinent negatives include no abdominal pain, chest pain or fever. She has tried analgesics for the symptoms. The treatment provided mild relief.     Review of Systems  Constitutional: Positive for fatigue. Negative for chills and fever.  HENT: Negative for trouble swallowing.   Respiratory: Positive for cough and wheezing. Negative for chest tightness and shortness of breath.   Cardiovascular: Negative for chest pain and palpitations.  Gastrointestinal: Positive for diarrhea. Negative for abdominal pain.  Musculoskeletal: Positive for back pain.  Hematological: Negative for adenopathy.    Patient Active Problem List   Diagnosis Date Noted  . Degenerative lumbar spinal stenosis 01/01/2016  . Primary osteoarthritis of both hips  01/01/2016  . Insomnia 07/17/2012  . Abnormality of lung on CXR 01/21/2012  . Anemia 11/13/2011  . DIVERTICULOSIS-COLON 05/29/2009  . Vitamin D deficiency 10/11/2008  . B12 deficiency 10/04/2008  . Bunion 10/19/2007  . ESOPHAGEAL STRICTURE 10/13/2007  . IBS 10/01/2007  . Anxiety and depression 07/31/2007  . Essential hypertension 07/31/2007  . GERD 07/31/2007  . History of colonic polyps 07/31/2007    Prior to Admission medications   Medication Sig Start Date End Date Taking? Authorizing Provider  acetaminophen (TYLENOL) 500 MG tablet Take 500 mg by mouth every 6 (six) hours as needed.   Yes [provider]  ALPRAZolam (XANAX) 0.25 MG tablet Take 0.25 mg by mouth at bedtime as needed.    Yes Lew Dawes, MD  amitriptyline (ELAVIL) 50 MG tablet Take 50 mg by mouth at bedtime.   Yes Lew Dawes, MD  bisoprolol (ZEBETA) 5 MG tablet TAKE ONE-HALF (1/2) TABLET BY MOUTH DAILY 05/21/16  Yes Glean Hess, MD  buPROPion [facility] SR) 150 MG 12 hr tablet Take 300 mg by mouth daily.    Yes Lew Dawes, MD  Cholecalciferol (VITAMIN D3) 1000 units CAPS Take 2,000 Units by mouth daily.   Yes [provider]  gabapentin (NEURONTIN) 100 MG capsule Take 1-3 capsules by mouth at bedtime for RLS 03/07/14  Yes Lew Dawes, MD  pantoprazole (PROTONIX) 40 MG tablet Take 1 tablet (40 mg total) by mouth 2 (two) times daily. 05/21/16  Yes Glean Hess, MD  triamterene-hydrochlorothiazide (DYAZIDE) 37.5-25 MG capsule TAKE 1 CAPSULE BY MOUTH EVERY DAY.  05/21/16  Yes Glean Hess, MD  vitamin B-12 (CYANOCOBALAMIN) 1000 MCG tablet Take 1,000 mcg by mouth daily.   Yes [provider]    No Known Allergies  Past Surgical History:  Procedure Laterality Date  . ABDOMINAL HYSTERECTOMY    . APPENDECTOMY    . breast biopsy- benign    . BREAST CYST EXCISION Left   . bunion/morton's neuroma  4/07   right foot   . carpal tunnel release-right    .  CHOLECYSTECTOMY  1994  . colitis transfusion     hosp at 25  . COLONOSCOPY     very difficul- supp with BE past  . CT HEAD (other)  6/10   small vessel chronic change/ no acute findings  . EGD tight ues sphincter  7/09   with dialtion  . FOOT SURGERY  10/09   TMT fusion and bunionectomy  . HEMORRHOID SURGERY    . hysterectomy (other)     age 25; ovarian cancer (1962)  . INCISIONAL HERNIA REPAIR     after ccy  . left knee surgery    . left shoulder replacement    . OOPHORECTOMY Bilateral 1962  . right shoulder surgery    . stress cardiolite  9/10  . VIDEO BRONCHOSCOPY  04/22/2012   Procedure: VIDEO BRONCHOSCOPY WITH FLUORO;  Surgeon: Juanito Doom, MD;  Location: [facility];  Service: Cardiopulmonary;  Laterality: Bilateral;    Social History  Substance Use Topics  . Smoking status: Never Smoker  . Smokeless tobacco: Never Used  . Alcohol use Yes     Comment: social wine      Medication list has been reviewed and updated.  PHQ 2/9 Scores 11/18/2016 05/21/2016 01/01/2016 08/03/2013  PHQ - 2 Score 0 0 0 0  PHQ- 9 Score 0 - - -    Physical Exam  Constitutional: She is oriented to person, place, and time. She appears well-developed. No distress.  HENT:  Head: Normocephalic and atraumatic.  Right Ear: Tympanic membrane and ear canal normal.  Left Ear: Tympanic membrane and ear canal normal.  Nose: Right sinus exhibits no maxillary sinus tenderness and no frontal sinus tenderness. Left sinus exhibits no maxillary sinus tenderness and no frontal sinus tenderness.  Mouth/Throat: No posterior oropharyngeal erythema.  Hoarse voice  Neck: Normal range of motion. Neck supple. No thyromegaly present.  Cardiovascular: Normal rate, regular rhythm and normal heart sounds.   Pulmonary/Chest: Effort normal and breath sounds normal. No respiratory distress. She has no wheezes.  Musculoskeletal: Normal range of motion.       Arms: Neurological: She is alert and oriented to person,  place, and time.  Skin: Skin is warm and dry. No rash noted.  Psychiatric: She has a normal mood and affect. Her behavior is normal. Thought content normal.  Nursing note and vitals reviewed.   BP (!) 152/78   Pulse 89   Temp 98.3 F (36.8 C) (Oral)   Ht 5\' 3"  (1.6 m)   Wt 154 lb (69.9 kg)   SpO2 96%   BMI 27.28 kg/m   Assessment and Plan: 1. Bronchitis Continue warm fluids, use codeine syrup at night - guaiFENesin-codeine (ROBITUSSIN AC) 100-10 MG/5ML syrup; Take 5 mLs by mouth 3 (three) times daily as needed for cough.  Dispense: 150 mL; Refill: 0 - doxycycline (VIBRA-TABS) 100 MG tablet; Take 1 tablet (100 mg total) by mouth 2 (two) times daily.  Dispense: 20 tablet; Refill: 0  2. Low back strain, initial encounter  Seeing Ortho tomorrow   Meds ordered this encounter  Medications  . guaiFENesin-codeine (ROBITUSSIN AC) 100-10 MG/5ML syrup    Sig: Take 5 mLs by mouth 3 (three) times daily as needed for cough.    Dispense:  150 mL    Refill:  0  . doxycycline (VIBRA-TABS) 100 MG tablet    Sig: Take 1 tablet (100 mg total) by mouth 2 (two) times daily.    Dispense:  20 tablet    Refill:  0    Partially dictated using Editor, commissioning. Any errors are unintentional.  Halina Maidens, MD [facility]  12/31/2016

## 2017-01-01 DIAGNOSIS — M545 Low back pain: Secondary | ICD-10-CM | POA: Diagnosis not present

## 2017-01-01 DIAGNOSIS — M461 Sacroiliitis, not elsewhere classified: Secondary | ICD-10-CM | POA: Diagnosis not present

## 2017-05-13 ENCOUNTER — Other Ambulatory Visit: Payer: Self-pay

## 2017-05-13 ENCOUNTER — Ambulatory Visit (INDEPENDENT_AMBULATORY_CARE_PROVIDER_SITE_OTHER)
Admission: EM | Admit: 2017-05-13 | Discharge: 2017-05-13 | Disposition: A | Discharge status: Home / Self Care | Payer: PPO | Source: Home / Self Care | Attending: Family Medicine | Admitting: Family Medicine

## 2017-05-13 ENCOUNTER — Inpatient Hospital Stay
Admission: EM | Admit: 2017-05-13 | Discharge: 2017-05-16 | DRG: 872 | Disposition: A | Discharge status: Skilled Nursing Facility | Payer: PPO | Attending: Internal Medicine | Admitting: Internal Medicine

## 2017-05-13 ENCOUNTER — Encounter: Payer: Self-pay | Admitting: Emergency Medicine

## 2017-05-13 ENCOUNTER — Emergency Department: Payer: PPO

## 2017-05-13 DIAGNOSIS — R531 Weakness: Secondary | ICD-10-CM

## 2017-05-13 DIAGNOSIS — D649 Anemia, unspecified: Secondary | ICD-10-CM | POA: Diagnosis not present

## 2017-05-13 DIAGNOSIS — D513 Other dietary vitamin B12 deficiency anemia: Secondary | ICD-10-CM | POA: Diagnosis not present

## 2017-05-13 DIAGNOSIS — Z9221 Personal history of antineoplastic chemotherapy: Secondary | ICD-10-CM | POA: Diagnosis not present

## 2017-05-13 DIAGNOSIS — N12 Tubulo-interstitial nephritis, not specified as acute or chronic: Secondary | ICD-10-CM

## 2017-05-13 DIAGNOSIS — A419 Sepsis, unspecified organism: Secondary | ICD-10-CM | POA: Diagnosis not present

## 2017-05-13 DIAGNOSIS — E860 Dehydration: Secondary | ICD-10-CM | POA: Diagnosis not present

## 2017-05-13 DIAGNOSIS — Z90710 Acquired absence of both cervix and uterus: Secondary | ICD-10-CM | POA: Diagnosis not present

## 2017-05-13 DIAGNOSIS — A4151 Sepsis due to Escherichia coli [E. coli]: Secondary | ICD-10-CM | POA: Diagnosis not present

## 2017-05-13 DIAGNOSIS — N179 Acute kidney failure, unspecified: Secondary | ICD-10-CM

## 2017-05-13 DIAGNOSIS — N200 Calculus of kidney: Secondary | ICD-10-CM | POA: Diagnosis not present

## 2017-05-13 DIAGNOSIS — R319 Hematuria, unspecified: Secondary | ICD-10-CM

## 2017-05-13 DIAGNOSIS — F064 Anxiety disorder due to known physiological condition: Secondary | ICD-10-CM | POA: Diagnosis not present

## 2017-05-13 DIAGNOSIS — Z96612 Presence of left artificial shoulder joint: Secondary | ICD-10-CM | POA: Diagnosis present

## 2017-05-13 DIAGNOSIS — E569 Vitamin deficiency, unspecified: Secondary | ICD-10-CM | POA: Diagnosis not present

## 2017-05-13 DIAGNOSIS — Z79899 Other long term (current) drug therapy: Secondary | ICD-10-CM | POA: Diagnosis not present

## 2017-05-13 DIAGNOSIS — Z8543 Personal history of malignant neoplasm of ovary: Secondary | ICD-10-CM | POA: Diagnosis not present

## 2017-05-13 DIAGNOSIS — E70319 Ocular albinism, unspecified: Secondary | ICD-10-CM | POA: Diagnosis present

## 2017-05-13 DIAGNOSIS — F3289 Other specified depressive episodes: Secondary | ICD-10-CM | POA: Diagnosis not present

## 2017-05-13 DIAGNOSIS — G2581 Restless legs syndrome: Secondary | ICD-10-CM | POA: Diagnosis not present

## 2017-05-13 DIAGNOSIS — R630 Anorexia: Secondary | ICD-10-CM | POA: Diagnosis not present

## 2017-05-13 DIAGNOSIS — F419 Anxiety disorder, unspecified: Secondary | ICD-10-CM | POA: Diagnosis present

## 2017-05-13 DIAGNOSIS — K219 Gastro-esophageal reflux disease without esophagitis: Secondary | ICD-10-CM | POA: Diagnosis not present

## 2017-05-13 DIAGNOSIS — N390 Urinary tract infection, site not specified: Secondary | ICD-10-CM | POA: Diagnosis present

## 2017-05-13 DIAGNOSIS — R2681 Unsteadiness on feet: Secondary | ICD-10-CM | POA: Diagnosis not present

## 2017-05-13 DIAGNOSIS — I10 Essential (primary) hypertension: Secondary | ICD-10-CM | POA: Diagnosis not present

## 2017-05-13 DIAGNOSIS — J40 Bronchitis, not specified as acute or chronic: Secondary | ICD-10-CM

## 2017-05-13 DIAGNOSIS — M6281 Muscle weakness (generalized): Secondary | ICD-10-CM | POA: Diagnosis not present

## 2017-05-13 DIAGNOSIS — B9620 Unspecified Escherichia coli [E. coli] as the cause of diseases classified elsewhere: Secondary | ICD-10-CM | POA: Diagnosis present

## 2017-05-13 DIAGNOSIS — D72829 Elevated white blood cell count, unspecified: Secondary | ICD-10-CM

## 2017-05-13 LAB — CBC WITH DIFFERENTIAL/PLATELET
Basophils Absolute: 0 10*3/uL (ref 0–0.1)
Basophils Relative: 0 %
Eosinophils Absolute: 0.2 10*3/uL (ref 0–0.7)
Eosinophils Relative: 1 %
HEMATOCRIT: 31.7 % — AB (ref 35.0–47.0)
HEMOGLOBIN: 10.8 g/dL — AB (ref 12.0–16.0)
LYMPHS ABS: 0.6 10*3/uL — AB (ref 1.0–3.6)
LYMPHS PCT: 4 %
MCH: 31.5 pg (ref 26.0–34.0)
MCHC: 34.1 g/dL (ref 32.0–36.0)
MCV: 92.4 fL (ref 80.0–100.0)
MONOS PCT: 9 %
Monocytes Absolute: 1.4 10*3/uL — ABNORMAL HIGH (ref 0.2–0.9)
NEUTROS ABS: 13 10*3/uL — AB (ref 1.4–6.5)
Neutrophils Relative %: 86 %
Platelets: 195 10*3/uL (ref 150–440)
RBC: 3.44 MIL/uL — ABNORMAL LOW (ref 3.80–5.20)
RDW: 14.4 % (ref 11.5–14.5)
WBC: 15.2 10*3/uL — ABNORMAL HIGH (ref 3.6–11.0)

## 2017-05-13 LAB — URINALYSIS, COMPLETE (UACMP) WITH MICROSCOPIC
Glucose, UA: NEGATIVE mg/dL
Ketones, ur: NEGATIVE mg/dL
NITRITE: NEGATIVE
PH: 5.5 (ref 5.0–8.0)
Protein, ur: >300 mg/dL — AB
SPECIFIC GRAVITY, URINE: 1.015 (ref 1.005–1.030)

## 2017-05-13 LAB — BASIC METABOLIC PANEL
Anion gap: 13 (ref 5–15)
BUN: 87 mg/dL — AB (ref 6–20)
CHLORIDE: 97 mmol/L — AB (ref 101–111)
CO2: 22 mmol/L (ref 22–32)
Calcium: 8.8 mg/dL — ABNORMAL LOW (ref 8.9–10.3)
Creatinine, Ser: 3.47 mg/dL — ABNORMAL HIGH (ref 0.44–1.00)
GFR calc Af Amer: 13 mL/min — ABNORMAL LOW (ref >60)
GFR calc non Af Amer: 12 mL/min — ABNORMAL LOW (ref >60)
Glucose, Bld: 109 mg/dL — ABNORMAL HIGH (ref 65–99)
POTASSIUM: 3.6 mmol/L (ref 3.5–5.1)
Sodium: 132 mmol/L — ABNORMAL LOW (ref 135–145)

## 2017-05-13 LAB — RAPID INFLUENZA A&B ANTIGENS
Influenza A (ARMC): NEGATIVE
Influenza B (ARMC): NEGATIVE

## 2017-05-13 LAB — LACTIC ACID, PLASMA: Lactic Acid, Venous: 0.7 mmol/L (ref 0.5–1.9)

## 2017-05-13 MED ORDER — SODIUM CHLORIDE 0.9 % IV BOLUS (SEPSIS)
1000.0000 mL | Freq: Once | INTRAVENOUS | Status: AC
  Administered 2017-05-13: 1000 mL via INTRAVENOUS

## 2017-05-13 MED ORDER — SODIUM CHLORIDE 0.9 % IV SOLN
1.0000 g | INTRAVENOUS | Status: DC
  Filled 2017-05-13: qty 10

## 2017-05-13 MED ORDER — ONDANSETRON HCL 4 MG/2ML IJ SOLN: 4.0000 mg | Freq: Four times a day (QID) | INTRAMUSCULAR | Status: DC | PRN

## 2017-05-13 MED ORDER — HEPARIN SODIUM (PORCINE) 5000 UNIT/ML IJ SOLN
5000.0000 [IU] | Freq: Three times a day (TID) | INTRAMUSCULAR | Status: DC
  Administered 2017-05-13 – 2017-05-16 (×8): 5000 [IU] via SUBCUTANEOUS
  Filled 2017-05-13 (×8): qty 1

## 2017-05-13 MED ORDER — VITAMIN D 1000 UNITS PO TABS
2000.0000 [IU] | ORAL_TABLET | Freq: Every day | ORAL | Status: DC
  Administered 2017-05-13 – 2017-05-16 (×4): 2000 [IU] via ORAL
  Filled 2017-05-13 (×4): qty 2

## 2017-05-13 MED ORDER — ACETAMINOPHEN 650 MG RE SUPP: 650.0000 mg | Freq: Four times a day (QID) | RECTAL | Status: DC | PRN

## 2017-05-13 MED ORDER — SODIUM CHLORIDE 0.9 % IV SOLN: 1.0000 g | INTRAVENOUS | Status: DC

## 2017-05-13 MED ORDER — SODIUM CHLORIDE 0.9 % IV SOLN
1.0000 g | Freq: Once | INTRAVENOUS | Status: AC
  Administered 2017-05-13: 1 g via INTRAVENOUS
  Filled 2017-05-13: qty 10

## 2017-05-13 MED ORDER — HYDROCODONE-ACETAMINOPHEN 5-325 MG PO TABS
1.0000 | ORAL_TABLET | ORAL | Status: DC | PRN
  Administered 2017-05-13: 2 via ORAL
  Administered 2017-05-13: 1 via ORAL
  Administered 2017-05-14 – 2017-05-16 (×8): 2 via ORAL
  Filled 2017-05-13 (×9): qty 2
  Filled 2017-05-13: qty 1

## 2017-05-13 MED ORDER — SODIUM CHLORIDE 0.9 % IV SOLN
INTRAVENOUS | Status: DC
  Administered 2017-05-13 – 2017-05-16 (×5): via INTRAVENOUS

## 2017-05-13 MED ORDER — ACETAMINOPHEN 325 MG PO TABS
650.0000 mg | ORAL_TABLET | Freq: Four times a day (QID) | ORAL | Status: DC | PRN
  Administered 2017-05-15 – 2017-05-16 (×2): 650 mg via ORAL
  Filled 2017-05-13 (×2): qty 2

## 2017-05-13 MED ORDER — ALPRAZOLAM 0.5 MG PO TABS: 0.2500 mg | ORAL_TABLET | Freq: Every evening | ORAL | Status: DC | PRN

## 2017-05-13 MED ORDER — BISOPROLOL FUMARATE 5 MG PO TABS
2.5000 mg | ORAL_TABLET | Freq: Every day | ORAL | Status: DC
  Administered 2017-05-14 – 2017-05-16 (×3): 2.5 mg via ORAL
  Filled 2017-05-13 (×3): qty 0.5

## 2017-05-13 MED ORDER — GABAPENTIN 100 MG PO CAPS
200.0000 mg | ORAL_CAPSULE | Freq: Every day | ORAL | Status: DC
  Administered 2017-05-13 – 2017-05-15 (×3): 200 mg via ORAL
  Filled 2017-05-13 (×3): qty 2

## 2017-05-13 MED ORDER — PANTOPRAZOLE SODIUM 40 MG PO TBEC
40.0000 mg | DELAYED_RELEASE_TABLET | Freq: Two times a day (BID) | ORAL | Status: DC
  Administered 2017-05-13 – 2017-05-16 (×6): 40 mg via ORAL
  Filled 2017-05-13 (×6): qty 1

## 2017-05-13 MED ORDER — AMITRIPTYLINE HCL 50 MG PO TABS
50.0000 mg | ORAL_TABLET | Freq: Every day | ORAL | Status: DC
  Administered 2017-05-13 – 2017-05-15 (×3): 50 mg via ORAL
  Filled 2017-05-13 (×4): qty 1

## 2017-05-13 MED ORDER — BUPROPION HCL ER (SR) 150 MG PO TB12
300.0000 mg | ORAL_TABLET | Freq: Every day | ORAL | Status: DC
  Administered 2017-05-14 – 2017-05-16 (×3): 300 mg via ORAL
  Filled 2017-05-13 (×3): qty 2

## 2017-05-13 MED ORDER — ACETAMINOPHEN 500 MG PO TABS: 500.0000 mg | ORAL_TABLET | Freq: Four times a day (QID) | ORAL | Status: DC | PRN

## 2017-05-13 MED ORDER — ONDANSETRON HCL 4 MG PO TABS: 4.0000 mg | ORAL_TABLET | Freq: Four times a day (QID) | ORAL | Status: DC | PRN

## 2017-05-13 MED ORDER — VITAMIN B-12 1000 MCG PO TABS
1000.0000 ug | ORAL_TABLET | Freq: Every day | ORAL | Status: DC
  Administered 2017-05-13 – 2017-05-16 (×4): 1000 ug via ORAL
  Filled 2017-05-13 (×4): qty 1

## 2017-05-13 NOTE — ED Notes (Signed)
Pt assisted to toilet 

## 2017-05-13 NOTE — ED Triage Notes (Signed)
Pt c/o dizziness, and weakness since Friday.

## 2017-05-13 NOTE — Discharge Instructions (Signed)
Discussed with patient and daughter recommend patient go to Emergency Department for further evaluation and management

## 2017-05-13 NOTE — ED Provider Notes (Signed)
[facility]    CSN: [id number] Arrival date & time: 05/13/17  1043     History   Chief Complaint Chief Complaint  Patient presents with  . Dizziness    HPI Jodi Bartlett is a 82 y.o. female.   82 yo female with a c/o dizziness and generalized weakness for the past 5 days. States has also noticed some lower abdominal discomfort with urination. Denies any fevers, chills, vomiting, melena, hematochezia, cough, congestion.    The history is provided by the patient.  Dizziness    Past Medical History:  Diagnosis Date  . Allergy   . Anemia    hx of  . Anxiety   . Arthritis   . Back pain   . Blood transfusion without reported diagnosis   . Depressed   . Diverticulosis 2010   Colonoscopy  . Dizziness    vertigo  . GERD (gastroesophageal reflux disease)   . Hiatal hernia    EGD   . HTN (hypertension)   . Hx of colonic polyps 2010   Colonoscopy  . IBS (irritable bowel syndrome)   . Interstitial cystitis   . OA (ocular albinism) ([place])   . Ovarian cancer ([place])   . Personal history of chemotherapy   . Stricture and stenosis of esophagus 2009   EGD   . UTI (lower urinary tract infection)    hx of      Patient Active Problem List   Diagnosis Date Noted  . Degenerative lumbar spinal stenosis 01/01/2016  . Primary osteoarthritis of both hips 01/01/2016  . Insomnia 07/17/2012  . Abnormality of lung on CXR 01/21/2012  . Anemia 11/13/2011  . DIVERTICULOSIS-COLON 05/29/2009  . Vitamin D deficiency 10/11/2008  . B12 deficiency 10/04/2008  . Bunion 10/19/2007  . ESOPHAGEAL STRICTURE 10/13/2007  . IBS 10/01/2007  . Anxiety and depression 07/31/2007  . Essential hypertension 07/31/2007  . GERD 07/31/2007  . History of colonic polyps 07/31/2007    Past Surgical History:  Procedure Laterality Date  . ABDOMINAL HYSTERECTOMY    . APPENDECTOMY    . breast biopsy- benign    . BREAST CYST EXCISION Left   . bunion/morton's neuroma  4/07   right foot   .  carpal tunnel release-right    . CHOLECYSTECTOMY  1994  . colitis transfusion     hosp at 25  . COLONOSCOPY     very difficul- supp with BE past  . CT HEAD (other)  6/10   small vessel chronic change/ no acute findings  . EGD tight ues sphincter  7/09   with dialtion  . FOOT SURGERY  10/09   TMT fusion and bunionectomy  . HEMORRHOID SURGERY    . hysterectomy (other)     age 89; ovarian cancer (1962)  . INCISIONAL HERNIA REPAIR     after ccy  . left knee surgery    . left shoulder replacement    . OOPHORECTOMY Bilateral 1962  . right shoulder surgery    . stress cardiolite  9/10  . VIDEO BRONCHOSCOPY  04/22/2012   Procedure: VIDEO BRONCHOSCOPY WITH FLUORO;  Surgeon: Juanito Doom, MD;  Location: [facility];  Service: Cardiopulmonary;  Laterality: Bilateral;    OB History    No data available       Home Medications    Prior to Admission medications   Medication Sig Start Date End Date Taking? Authorizing Provider  ALPRAZolam Duanne Moron) 0.25 MG tablet Take 0.25 mg by mouth at bedtime as  needed.    Yes Lew Dawes, MD  amitriptyline (ELAVIL) 50 MG tablet Take 50 mg by mouth at bedtime.   Yes Lew Dawes, MD  bisoprolol (ZEBETA) 5 MG tablet TAKE ONE-HALF (1/2) TABLET BY MOUTH DAILY 05/21/16  Yes Glean Hess, MD  buPROPion [facility] SR) 150 MG 12 hr tablet Take 300 mg by mouth daily.    Yes Lew Dawes, MD  Cholecalciferol (VITAMIN D3) 1000 units CAPS Take 2,000 Units by mouth daily.   Yes [provider]  gabapentin (NEURONTIN) 100 MG capsule Take 1-3 capsules by mouth at bedtime for RLS 03/07/14  Yes Lew Dawes, MD  pantoprazole (PROTONIX) 40 MG tablet Take 1 tablet (40 mg total) by mouth 2 (two) times daily. 05/21/16  Yes Glean Hess, MD  triamterene-hydrochlorothiazide (DYAZIDE) 37.5-25 MG capsule TAKE 1 CAPSULE BY MOUTH EVERY DAY. 05/21/16  Yes Glean Hess, MD  vitamin B-12 (CYANOCOBALAMIN) 1000 MCG tablet Take 1,000 mcg by mouth  daily.   Yes [provider]  acetaminophen (TYLENOL) 500 MG tablet Take 500 mg by mouth every 6 (six) hours as needed.    [provider]  doxycycline (VIBRA-TABS) 100 MG tablet Take 1 tablet (100 mg total) by mouth 2 (two) times daily. 12/31/16   Glean Hess, MD  guaiFENesin-codeine [facility]) 100-10 MG/5ML syrup Take 5 mLs by mouth 3 (three) times daily as needed for cough. 12/31/16   Glean Hess, MD    Family History Family History  Problem Relation Age of Onset  . Coronary artery disease Father   . Hypertension Father   . Heart disease Father        CAD  . Stroke Mother   . Hypertension Sister   . Hyperlipidemia Sister   . Ovarian cancer Maternal Grandmother   . Colon cancer Neg Hx   . Esophageal cancer Neg Hx   . Rectal cancer Neg Hx   . Stomach cancer Neg Hx   . Breast cancer Neg Hx     Social History Social History   Tobacco Use  . Smoking status: Never Smoker  . Smokeless tobacco: Never Used  Substance Use Topics  . Alcohol use: Yes    Comment: social wine   . Drug use: No     Allergies   Patient has no known allergies.   Review of Systems Review of Systems  Neurological: Positive for dizziness.     Physical Exam Triage Vital Signs ED Triage Vitals  Enc Vitals Group     BP 05/13/17 1104 (!) 102/43     Pulse Rate 05/13/17 1104 (!) 109     Resp 05/13/17 1104 18     Temp 05/13/17 1104 98.2 F (36.8 C)     Temp Source 05/13/17 1104 Oral     SpO2 05/13/17 1104 99 %     Weight 05/13/17 1110 129 lb 3.2 oz (58.6 kg)     Height --      Head Circumference --      Peak Flow --      Pain Score 05/13/17 1106 10     Pain Loc --      Pain Edu? --      Excl. in [place]? --    No data found.  Updated Vital Signs BP (!) 102/43 (BP Location: Right Arm)   Pulse (!) 109   Temp 98.2 F (36.8 C) (Oral)   Resp 18   Wt 129 lb 3.2 oz (58.6  kg)   SpO2 99%   BMI 22.89 kg/m   Visual Acuity Right Eye Distance:   Left Eye  Distance:   Bilateral Distance:    Right Eye Near:   Left Eye Near:    Bilateral Near:     Physical Exam  Constitutional: She appears well-developed and well-nourished. No distress.  Cardiovascular: Regular rhythm and normal heart sounds. Tachycardia present.  Pulmonary/Chest: Effort normal and breath sounds normal. No stridor. No respiratory distress. She has no wheezes. She has no rales.  Abdominal: Soft. Bowel sounds are normal. She exhibits no distension and no mass. There is tenderness (suprapubic). There is no rebound and no guarding.  Skin: She is not diaphoretic.  Nursing note and vitals reviewed.    UC Treatments / Results  Labs (all labs ordered are listed, but only abnormal results are displayed) Labs Reviewed  URINALYSIS, COMPLETE (UACMP) WITH MICROSCOPIC - Abnormal; Notable for the following components:      Result Value   APPearance CLOUDY (*)    Hgb urine dipstick MODERATE (*)    Bilirubin Urine SMALL (*)    Protein, ur >300 (*)    Leukocytes, UA MODERATE (*)    Squamous Epithelial / LPF 0-5 (*)    Bacteria, UA MANY (*)    All other components within normal limits  CBC WITH DIFFERENTIAL/PLATELET - Abnormal; Notable for the following components:   WBC 15.2 (*)    RBC 3.44 (*)    Hemoglobin 10.8 (*)    HCT 31.7 (*)    Neutro Abs 13.0 (*)    Lymphs Abs 0.6 (*)    Monocytes Absolute 1.4 (*)    All other components within normal limits  BASIC METABOLIC PANEL - Abnormal; Notable for the following components:   Sodium 132 (*)    Chloride 97 (*)    Glucose, Bld 109 (*)    BUN 87 (*)    Creatinine, Ser 3.47 (*)    Calcium 8.8 (*)    GFR calc non Af Amer 12 (*)    GFR calc Af Amer 13 (*)    All other components within normal limits  RAPID INFLUENZA A&B ANTIGENS ([facility] ONLY)  URINE CULTURE    EKG  EKG Interpretation None       Radiology No results found.  Procedures Procedures (including critical care time)  Medications Ordered in UC Medications  - No data to display   Initial Impression / Assessment and Plan / UC Course  I have reviewed the triage vital signs and the nursing notes.  Pertinent labs & imaging results that were available during my care of the patient were reviewed by me and considered in my medical decision making (see chart for details).       Final Clinical Impressions(s) / UC Diagnoses   Final diagnoses:  Acute renal failure, unspecified acute renal failure type ([place])  Urinary tract infection with hematuria, site unspecified  Leukocytosis, unspecified type  Weakness    ED Discharge Orders    None     1. Lab results and diagnosis reviewed with patient and daughter; recommend patient go to Emergency Department for further evaluation and management; patient and daughter verbalize understanding and will proceed by private vehicle; report called to triage RN at [facility] ED  Controlled Substance Prescriptions  Controlled Substance Registry consulted? Not Applicable   Norval Gable, MD 05/13/17 1215

## 2017-05-13 NOTE — ED Notes (Signed)
Resumed care from samantha rn.  Pt alert.  Pt waiting on admission.

## 2017-05-13 NOTE — ED Notes (Signed)
Pt gone to CT 

## 2017-05-13 NOTE — ED Notes (Signed)
Report called to annabelle rn floor nurse

## 2017-05-13 NOTE — ED Provider Notes (Signed)
[facility] Emergency Department Provider Note  ____________________________________________  Time seen: Approximately 1:29 PM  I have reviewed the triage vital signs and the nursing notes.   HISTORY  Chief Complaint Abnormal Lab   HPI Jodi Bartlett is a 82 y.o. female with a history of vertigo, anemia, IBS who presents from UC for acute kidney injury and a UTI. Patient presented to urgent care for progressively worsening generalized weakness and dysuria for 5 days. Patient reports decreased appetite and has not been eating and drinking normally for 5 days. She denies fever, chills, abdominal pain, flank pain, vomiting, diarrhea, cough, congestion, chest pain, shortness of breath. Patient reports that she has been feeling very dizzy which she describes as vertigo for the last 5 days. The vertigo resolves if she is laying or sitting down.  Past Medical History:  Diagnosis Date  . Allergy   . Anemia    hx of  . Anxiety   . Arthritis   . Back pain   . Blood transfusion without reported diagnosis   . Depressed   . Diverticulosis 2010   Colonoscopy  . Dizziness    vertigo  . GERD (gastroesophageal reflux disease)   . Hiatal hernia    EGD   . HTN (hypertension)   . Hx of colonic polyps 2010   Colonoscopy  . IBS (irritable bowel syndrome)   . Interstitial cystitis   . OA (ocular albinism) ([place])   . Ovarian cancer ([place])   . Personal history of chemotherapy   . Stricture and stenosis of esophagus 2009   EGD   . UTI (lower urinary tract infection)    hx of      Patient Active Problem List   Diagnosis Date Noted  . Acute renal failure (ARF) ([place]) 05/13/2017  . Degenerative lumbar spinal stenosis 01/01/2016  . Primary osteoarthritis of both hips 01/01/2016  . Insomnia 07/17/2012  . Abnormality of lung on CXR 01/21/2012  . Anemia 11/13/2011  . DIVERTICULOSIS-COLON 05/29/2009  . Vitamin D deficiency 10/11/2008  . B12 deficiency 10/04/2008  .  Bunion 10/19/2007  . ESOPHAGEAL STRICTURE 10/13/2007  . IBS 10/01/2007  . Anxiety and depression 07/31/2007  . Essential hypertension 07/31/2007  . GERD 07/31/2007  . History of colonic polyps 07/31/2007    Past Surgical History:  Procedure Laterality Date  . ABDOMINAL HYSTERECTOMY    . APPENDECTOMY    . breast biopsy- benign    . BREAST CYST EXCISION Left   . bunion/morton's neuroma  4/07   right foot   . carpal tunnel release-right    . CHOLECYSTECTOMY  1994  . colitis transfusion     hosp at 25  . COLONOSCOPY     very difficul- supp with BE past  . CT HEAD (other)  6/10   small vessel chronic change/ no acute findings  . EGD tight ues sphincter  7/09   with dialtion  . FOOT SURGERY  10/09   TMT fusion and bunionectomy  . HEMORRHOID SURGERY    . hysterectomy (other)     age 57; ovarian cancer (1962)  . INCISIONAL HERNIA REPAIR     after ccy  . left knee surgery    . left shoulder replacement    . OOPHORECTOMY Bilateral 1962  . right shoulder surgery    . stress cardiolite  9/10  . VIDEO BRONCHOSCOPY  04/22/2012   Procedure: VIDEO BRONCHOSCOPY WITH FLUORO;  Surgeon: Juanito Doom, MD;  Location: [facility];  Service:  Cardiopulmonary;  Laterality: Bilateral;    Prior to Admission medications   Medication Sig Start Date End Date Taking? Authorizing Provider  acetaminophen (TYLENOL) 500 MG tablet Take 500 mg by mouth every 6 (six) hours as needed.   Yes [provider]  ALPRAZolam (XANAX) 0.25 MG tablet Take 0.25 mg by mouth at bedtime as needed.    Yes Lew Dawes, MD  amitriptyline (ELAVIL) 50 MG tablet Take 50 mg by mouth at bedtime.   Yes Lew Dawes, MD  bisoprolol (ZEBETA) 5 MG tablet TAKE ONE-HALF (1/2) TABLET BY MOUTH DAILY 05/21/16  Yes Glean Hess, MD  buPROPion [facility] SR) 150 MG 12 hr tablet Take 300 mg by mouth daily.    Yes Lew Dawes, MD  Cholecalciferol (VITAMIN D3) 1000 units CAPS Take 2,000 Units by mouth daily.    Yes [provider]  gabapentin (NEURONTIN) 100 MG capsule Take 1-3 capsules by mouth at bedtime for RLS 03/07/14  Yes Lew Dawes, MD  pantoprazole (PROTONIX) 40 MG tablet Take 1 tablet (40 mg total) by mouth 2 (two) times daily. 05/21/16  Yes Glean Hess, MD  triamterene-hydrochlorothiazide (DYAZIDE) 37.5-25 MG capsule TAKE 1 CAPSULE BY MOUTH EVERY DAY. 05/21/16  Yes Glean Hess, MD  vitamin B-12 (CYANOCOBALAMIN) 1000 MCG tablet Take 1,000 mcg by mouth daily.   Yes [provider]  guaiFENesin-codeine (ROBITUSSIN AC) 100-10 MG/5ML syrup Take 5 mLs by mouth 3 (three) times daily as needed for cough. Patient not taking: Reported on 05/13/2017 12/31/16   Glean Hess, MD    Allergies Patient has no known allergies.  Family History  Problem Relation Age of Onset  . Coronary artery disease Father   . Hypertension Father   . Heart disease Father        CAD  . Stroke Mother   . Hypertension Sister   . Hyperlipidemia Sister   . Ovarian cancer Maternal Grandmother   . Colon cancer Neg Hx   . Esophageal cancer Neg Hx   . Rectal cancer Neg Hx   . Stomach cancer Neg Hx   . Breast cancer Neg Hx     Social History Social History   Tobacco Use  . Smoking status: Never Smoker  . Smokeless tobacco: Never Used  Substance Use Topics  . Alcohol use: Yes    Comment: social wine   . Drug use: No    Review of Systems  Constitutional: Negative for fever. + generalized weakness Eyes: Negative for visual changes. ENT: Negative for sore throat. Neck: No neck pain  Cardiovascular: Negative for chest pain. Respiratory: Negative for shortness of breath. Gastrointestinal: Negative for abdominal pain, vomiting or diarrhea. Genitourinary: + dysuria. Musculoskeletal: Negative for back pain. Skin: Negative for rash. Neurological: Negative for headaches, weakness or numbness. Psych: No SI or HI  ____________________________________________   PHYSICAL  EXAM:  VITAL SIGNS: ED Triage Vitals  Enc Vitals Group     BP 05/13/17 1235 (!) 115/57     Pulse Rate 05/13/17 1235 (!) 106     Resp 05/13/17 1235 18     Temp 05/13/17 1235 98.4 F (36.9 C)     Temp Source 05/13/17 1235 Oral     SpO2 05/13/17 1235 97 %     Weight 05/13/17 1250 129 lb (58.5 kg)     Height 05/13/17 1250 5\' 2"  (1.575 m)     Head Circumference --      Peak Flow --  Pain Score 05/13/17 1250 0     Pain Loc --      Pain Edu? --      Excl. in [place]? --     Constitutional: Alert and oriented. Well appearing and in no apparent distress. HEENT:      Head: Normocephalic and atraumatic.         Eyes: Conjunctivae are normal. Sclera is non-icteric.       Mouth/Throat: Mucous membranes are dry.       Neck: Supple with no signs of meningismus. Cardiovascular: Tachycardic with regular rhythm. No murmurs, gallops, or rubs. 2+ symmetrical distal pulses are present in all extremities. No JVD. Respiratory: Normal respiratory effort. Lungs are clear to auscultation bilaterally. No wheezes, crackles, or rhonchi.  Gastrointestinal: Soft, non tender, and non distended with positive bowel sounds. No rebound or guarding. Genitourinary: Mild R CVA tenderness. Musculoskeletal: Nontender with normal range of motion in all extremities. No edema, cyanosis, or erythema of extremities. Neurologic: Normal speech and language. Face is symmetric. Moving all extremities. No gross focal neurologic deficits are appreciated. Skin: Skin is warm, dry and intact. No rash noted. Psychiatric: Mood and affect are normal. Speech and behavior are normal.  ____________________________________________   LABS (all labs ordered are listed, but only abnormal results are displayed)  Labs Reviewed  CULTURE, BLOOD (ROUTINE X 2)  CULTURE, BLOOD (ROUTINE X 2)  LACTIC ACID, PLASMA   ____________________________________________  EKG  ED ECG REPORT I, Rudene Re, the attending physician, personally  viewed and interpreted this ECG.  Normal sinus rhythm, rate of 94, normal intervals, normal axis, no ST elevations or depressions, Q waves in inferior leads which are new when compared to prior from 2017 ____________________________________________  RADIOLOGY  Interpreted by me: CT renal: mesenteric edema, no ureteral stone   Interpretation by Radiologist:  Ct Renal Stone Study  Result Date: 05/13/2017 CLINICAL DATA:  82 year old with 5 day history of generalized weakness, dizziness, lower abdominal pain and dysuria. Acute renal insufficiency. EXAM: CT ABDOMEN AND PELVIS WITHOUT CONTRAST TECHNIQUE: Multidetector CT imaging of the abdomen and pelvis was performed following the standard protocol without IV contrast. COMPARISON:  CT abdomen and pelvis with contrast 12/10/2011. FINDINGS: Lower chest: Mild parenchymal scarring in the lower lobes and right middle lobe. Visualized lung bases otherwise clear. Normal heart size. Hepatobiliary: Liver upper normal in size with a normal unenhanced appearance. Surgically absent gallbladder. No biliary ductal dilation. Pancreas: Normal unenhanced appearance. Spleen: Normal unenhanced appearance. Adrenals/Urinary Tract: Normal appearing adrenal glands. Nonobstructing very small (2 mm) calculus in a mid calyx of the right kidney. No urinary tract calculi elsewhere on either side. Scarring involving the lower pole of the right kidney. Cortical thickness otherwise well-preserved bilaterally. Approximate 1.3 cm benign cyst arising from the lower pole of the right kidney. Allowing for the unenhanced technique, no solid renal masses. No hydronephrosis. Normal appearing decompressed urinary bladder. Stomach/Bowel: Moderate-sized hiatal hernia, unchanged. Stomach decompressed and otherwise normal in appearance. Normal-appearing small bowel. Sigmoid colon diverticulosis without evidence of acute diverticulitis. Appendix surgically absent. Vascular/Lymphatic: Mild to moderate  aorto-iliofemoral atherosclerosis without evidence of aneurysm. No pathologic lymphadenopathy. Reproductive: Surgically absent uterus no adnexal masses. Other: Edema involving the mesentery. Umbilical hernia containing fat. Bilateral inguinal hernias containing fat. Musculoskeletal: Osseous demineralization. Thoracolumbar levoscoliosis. Multilevel degenerative disc disease, spondylosis and facet degenerative changes throughout the lumbar spine. Multifactorial spinal stenosis at L3-4, L4-5 and to a lesser degree L5-S1. Degenerative changes involving the symphysis pubis. Mild degenerative changes involving both hips. No acute  abnormalities. IMPRESSION: 1. Nonspecific mild edema involving the mesentery. Query mesenteric venous congestion or mild mesenteritis. 2. No acute abnormalities otherwise involving the abdomen or pelvis. 3. Nonobstructing 2 mm calculus in a mid calyx of the right kidney. No urinary tract calculi elsewhere on either side. 4. Scarring involving the lower pole the right kidney. Benign cyst involving the lower pole the right kidney. 5. Moderate-sized hiatal hernia, stable since 2013. 6. Aortic Atherosclerosis (ICD10-170.0) 7. Skeletal findings as above, including multilevel mild-to-moderate spinal stenosis. Electronically Signed   By: Evangeline Dakin M.D.   On: 05/13/2017 14:01     ____________________________________________   PROCEDURES  Procedure(s) performed: None Procedures Critical Care performed:  None ____________________________________________   INITIAL IMPRESSION / ASSESSMENT AND PLAN / ED COURSE   82 y.o. female with a history of vertigo, anemia, IBS who presents from UC for acute kidney injury and a UTI. Labs done at urgent care show creatinine of 3.47 (patient's baseline is less than one), white count of 15.2, and UA positive for UTI. Patient looks dry and exam, she is tachycardic with a pulse of 106, she is afebrile, her abdomen is soft with no tenderness but she does  have right flank tenderness. CT renal has been ordered to rule out kidney stone. We'll send blood cultures, lactic, and start patient on Rocephin and IV fluids. Patient will be admitted to the hospitalist service.      As part of my medical decision making, I reviewed the following data within the [place] History obtained from family, Nursing notes reviewed and incorporated, Labs reviewed , EKG interpreted , Old EKG reviewed, Radiograph reviewed , Discussed with admitting physician , Notes from prior ED visits and [place] Controlled Substance Database    Pertinent labs & imaging results that were available during my care of the patient were reviewed by me and considered in my medical decision making (see chart for details).    ____________________________________________   FINAL CLINICAL IMPRESSION(S) / ED DIAGNOSES  Final diagnoses:  Pyelonephritis  AKI (acute kidney injury) ([place])      NEW MEDICATIONS STARTED DURING THIS VISIT:  ED Discharge Orders    None       Note:  This document was prepared using Dragon voice recognition software and may include unintentional dictation errors.    Alfred Levins, [place], MD 05/13/17 1450

## 2017-05-13 NOTE — ED Notes (Signed)
Bladder scan 150 ml

## 2017-05-13 NOTE — ED Triage Notes (Signed)
Pt in via POV; sent over from [facility], being seen there for generalized weakness and dizziness since Friday.  Pt advised to be evaluated here due to UTI and elevated kidney function.  Vitals WDL, NAD noted at this time.

## 2017-05-13 NOTE — H&P (Signed)
[facility] [place] NAME: Jodi Bartlett    MR#:  [id number]  DATE OF BIRTH:  1935/09/20  DATE OF ADMISSION:  05/13/2017  PRIMARY CARE PHYSICIAN: Glean Hess, MD   REQUESTING/REFERRING PHYSICIAN: Gonzella Lex MD CHIEF COMPLAINT:   Chief Complaint  Patient presents with  . Abnormal Lab    HISTORY OF PRESENT ILLNESS: Jodi Bartlett  is a 82 y.o. female with a known history of essential hypertension, anemia, anxiety, GERD and history of irritable bowel syndrome who was sent from urgent care to the ED for acute renal failure.  Patient went to the urgent care complaining of feeling dizzy and weak.  She also has had some burning with urination as well as suprapubic discomfort.  Patient was noted to have acute renal failure and a urinary tract infection therefore referred to the emergency room.  Last known creatinine level was in February 2018 which was normal.  Patient denies any nausea vomiting or diarrhea states that she tries to drink much fluid as possible. PAST MEDICAL HISTORY:   Past Medical History:  Diagnosis Date  . Allergy   . Anemia    hx of  . Anxiety   . Arthritis   . Back pain   . Blood transfusion without reported diagnosis   . Depressed   . Diverticulosis 2010   Colonoscopy  . Dizziness    vertigo  . GERD (gastroesophageal reflux disease)   . Hiatal hernia    EGD   . HTN (hypertension)   . Hx of colonic polyps 2010   Colonoscopy  . IBS (irritable bowel syndrome)   . Interstitial cystitis   . OA (ocular albinism) ([place])   . Ovarian cancer ([place])   . Personal history of chemotherapy   . Stricture and stenosis of esophagus 2009   EGD   . UTI (lower urinary tract infection)    hx of      PAST SURGICAL HISTORY:  Past Surgical History:  Procedure Laterality Date  . ABDOMINAL HYSTERECTOMY    . APPENDECTOMY    . breast biopsy- benign    . BREAST CYST EXCISION Left   . bunion/morton's neuroma  4/07   right foot    . carpal tunnel release-right    . CHOLECYSTECTOMY  1994  . colitis transfusion     hosp at 25  . COLONOSCOPY     very difficul- supp with BE past  . CT HEAD (other)  6/10   small vessel chronic change/ no acute findings  . EGD tight ues sphincter  7/09   with dialtion  . FOOT SURGERY  10/09   TMT fusion and bunionectomy  . HEMORRHOID SURGERY    . hysterectomy (other)     age 72; ovarian cancer (1962)  . INCISIONAL HERNIA REPAIR     after ccy  . left knee surgery    . left shoulder replacement    . OOPHORECTOMY Bilateral 1962  . right shoulder surgery    . stress cardiolite  9/10  . VIDEO BRONCHOSCOPY  04/22/2012   Procedure: VIDEO BRONCHOSCOPY WITH FLUORO;  Surgeon: Juanito Doom, MD;  Location: [facility];  Service: Cardiopulmonary;  Laterality: Bilateral;    SOCIAL HISTORY:  Social History   Tobacco Use  . Smoking status: Never Smoker  . Smokeless tobacco: Never Used  Substance Use Topics  . Alcohol use: Yes    Comment: social wine     FAMILY HISTORY:  Family History  Problem Relation Age of Onset  . Coronary artery disease Father   . Hypertension Father   . Heart disease Father        CAD  . Stroke Mother   . Hypertension Sister   . Hyperlipidemia Sister   . Ovarian cancer Maternal Grandmother   . Colon cancer Neg Hx   . Esophageal cancer Neg Hx   . Rectal cancer Neg Hx   . Stomach cancer Neg Hx   . Breast cancer Neg Hx     DRUG ALLERGIES: No Known Allergies  REVIEW OF SYSTEMS:   CONSTITUTIONAL: No fever, positive fatigue positive weakness.  EYES: No blurred or double vision.  EARS, NOSE, AND THROAT: No tinnitus or ear pain.  RESPIRATORY: No cough, shortness of breath, wheezing or hemoptysis.  CARDIOVASCULAR: No chest pain, orthopnea, edema.  GASTROINTESTINAL: No nausea, vomiting, diarrhea or abdominal pain.  GENITOURINARY: Positive dysuria, no hematuria.  ENDOCRINE: No polyuria, nocturia,  HEMATOLOGY: No anemia, easy bruising or  bleeding SKIN: No rash or lesion. MUSCULOSKELETAL: No joint pain or arthritis.   NEUROLOGIC: No tingling, numbness, weakness.  PSYCHIATRY: No anxiety or depression.   MEDICATIONS AT HOME:  Prior to Admission medications   Medication Sig Start Date End Date Taking? Authorizing Provider  acetaminophen (TYLENOL) 500 MG tablet Take 500 mg by mouth every 6 (six) hours as needed.   Yes [provider]  ALPRAZolam (XANAX) 0.25 MG tablet Take 0.25 mg by mouth at bedtime as needed.    Yes Lew Dawes, MD  amitriptyline (ELAVIL) 50 MG tablet Take 50 mg by mouth at bedtime.   Yes Lew Dawes, MD  bisoprolol (ZEBETA) 5 MG tablet TAKE ONE-HALF (1/2) TABLET BY MOUTH DAILY 05/21/16  Yes Glean Hess, MD  buPROPion [facility] SR) 150 MG 12 hr tablet Take 300 mg by mouth daily.    Yes Lew Dawes, MD  Cholecalciferol (VITAMIN D3) 1000 units CAPS Take 2,000 Units by mouth daily.   Yes [provider]  gabapentin (NEURONTIN) 100 MG capsule Take 1-3 capsules by mouth at bedtime for RLS 03/07/14  Yes Lew Dawes, MD  pantoprazole (PROTONIX) 40 MG tablet Take 1 tablet (40 mg total) by mouth 2 (two) times daily. 05/21/16  Yes Glean Hess, MD  triamterene-hydrochlorothiazide (DYAZIDE) 37.5-25 MG capsule TAKE 1 CAPSULE BY MOUTH EVERY DAY. 05/21/16  Yes Glean Hess, MD  vitamin B-12 (CYANOCOBALAMIN) 1000 MCG tablet Take 1,000 mcg by mouth daily.   Yes [provider]  guaiFENesin-codeine (ROBITUSSIN AC) 100-10 MG/5ML syrup Take 5 mLs by mouth 3 (three) times daily as needed for cough. Patient not taking: Reported on 05/13/2017 12/31/16   Glean Hess, MD      PHYSICAL EXAMINATION:   VITAL SIGNS: Blood pressure (!) 115/57, pulse (!) 106, temperature 98.4 F (36.9 C), temperature source Oral, resp. rate 18, height 5\' 2"  (1.575 m), weight 129 lb (58.5 kg), SpO2 97 %.  GENERAL:  82 y.o.-year-old patient lying in the bed with no acute distress.  EYES: Pupils  equal, round, reactive to light and accommodation. No scleral icterus. Extraocular muscles intact.  HEENT: Head atraumatic, normocephalic. Oropharynx and nasopharynx clear.  NECK:  Supple, no jugular venous distention. No thyroid enlargement, no tenderness.  LUNGS: Normal breath sounds bilaterally, no wheezing, rales,rhonchi or crepitation. No use of accessory muscles of respiration.  CARDIOVASCULAR: S1, S2 normal. No murmurs, rubs, or gallops.  ABDOMEN: Soft, nontender, nondistended. Bowel sounds present. No organomegaly or mass.  EXTREMITIES:  No pedal edema, cyanosis, or clubbing.  NEUROLOGIC: Cranial nerves II through XII are intact. Muscle strength 5/5 in all extremities. Sensation intact. Gait not checked.  PSYCHIATRIC: The patient is alert and oriented x 3.  SKIN: No obvious rash, lesion, or ulcer.   LABORATORY PANEL:   CBC Recent Labs  Lab 05/13/17 1125  WBC 15.2*  HGB 10.8*  HCT 31.7*  PLT 195  MCV 92.4  MCH 31.5  MCHC 34.1  RDW 14.4  LYMPHSABS 0.6*  MONOABS 1.4*  EOSABS 0.2  BASOSABS 0.0   ------------------------------------------------------------------------------------------------------------------  Chemistries  Recent Labs  Lab 05/13/17 1125  NA 132*  K 3.6  CL 97*  CO2 22  GLUCOSE 109*  BUN 87*  CREATININE 3.47*  CALCIUM 8.8*   ------------------------------------------------------------------------------------------------------------------ estimated creatinine clearance is 10.4 mL/min (A) (by C-G formula based on SCr of 3.47 mg/dL (H)). ------------------------------------------------------------------------------------------------------------------ No results for input(s): TSH, T4TOTAL, T3FREE, THYROIDAB in the last 72 hours.  Invalid input(s): FREET3   Coagulation profile No results for input(s): INR, PROTIME in the last 168  hours. ------------------------------------------------------------------------------------------------------------------- No results for input(s): DDIMER in the last 72 hours. -------------------------------------------------------------------------------------------------------------------  Cardiac Enzymes No results for input(s): CKMB, TROPONINI, MYOGLOBIN in the last 168 hours.  Invalid input(s): CK ------------------------------------------------------------------------------------------------------------------ Invalid input(s): POCBNP  ---------------------------------------------------------------------------------------------------------------  Urinalysis    Component Value Date/Time   COLORURINE YELLOW 05/13/2017 1125   APPEARANCEUR CLOUDY (A) 05/13/2017 1125   LABSPEC 1.015 05/13/2017 1125   PHURINE 5.5 05/13/2017 1125   GLUCOSEU NEGATIVE 05/13/2017 1125   HGBUR MODERATE (A) 05/13/2017 1125   BILIRUBINUR SMALL (A) 05/13/2017 1125   BILIRUBINUR Trace 10/30/2015 1614   KETONESUR NEGATIVE 05/13/2017 1125   PROTEINUR >300 (A) 05/13/2017 1125   UROBILINOGEN 0.2 10/30/2015 1614   UROBILINOGEN 1.0 05/22/2010 1144   NITRITE NEGATIVE 05/13/2017 1125   LEUKOCYTESUR MODERATE (A) 05/13/2017 1125     RADIOLOGY: Ct Renal Stone Study  Result Date: 05/13/2017 CLINICAL DATA:  82 year old with 5 day history of generalized weakness, dizziness, lower abdominal pain and dysuria. Acute renal insufficiency. EXAM: CT ABDOMEN AND PELVIS WITHOUT CONTRAST TECHNIQUE: Multidetector CT imaging of the abdomen and pelvis was performed following the standard protocol without IV contrast. COMPARISON:  CT abdomen and pelvis with contrast 12/10/2011. FINDINGS: Lower chest: Mild parenchymal scarring in the lower lobes and right middle lobe. Visualized lung bases otherwise clear. Normal heart size. Hepatobiliary: Liver upper normal in size with a normal unenhanced appearance. Surgically absent gallbladder.  No biliary ductal dilation. Pancreas: Normal unenhanced appearance. Spleen: Normal unenhanced appearance. Adrenals/Urinary Tract: Normal appearing adrenal glands. Nonobstructing very small (2 mm) calculus in a mid calyx of the right kidney. No urinary tract calculi elsewhere on either side. Scarring involving the lower pole of the right kidney. Cortical thickness otherwise well-preserved bilaterally. Approximate 1.3 cm benign cyst arising from the lower pole of the right kidney. Allowing for the unenhanced technique, no solid renal masses. No hydronephrosis. Normal appearing decompressed urinary bladder. Stomach/Bowel: Moderate-sized hiatal hernia, unchanged. Stomach decompressed and otherwise normal in appearance. Normal-appearing small bowel. Sigmoid colon diverticulosis without evidence of acute diverticulitis. Appendix surgically absent. Vascular/Lymphatic: Mild to moderate aorto-iliofemoral atherosclerosis without evidence of aneurysm. No pathologic lymphadenopathy. Reproductive: Surgically absent uterus no adnexal masses. Other: Edema involving the mesentery. Umbilical hernia containing fat. Bilateral inguinal hernias containing fat. Musculoskeletal: Osseous demineralization. Thoracolumbar levoscoliosis. Multilevel degenerative disc disease, spondylosis and facet degenerative changes throughout the lumbar spine. Multifactorial spinal stenosis at L3-4, L4-5 and to a lesser degree L5-S1. Degenerative changes involving the symphysis  pubis. Mild degenerative changes involving both hips. No acute abnormalities. IMPRESSION: 1. Nonspecific mild edema involving the mesentery. Query mesenteric venous congestion or mild mesenteritis. 2. No acute abnormalities otherwise involving the abdomen or pelvis. 3. Nonobstructing 2 mm calculus in a mid calyx of the right kidney. No urinary tract calculi elsewhere on either side. 4. Scarring involving the lower pole the right kidney. Benign cyst involving the lower pole the right  kidney. 5. Moderate-sized hiatal hernia, stable since 2013. 6. Aortic Atherosclerosis (ICD10-170.0) 7. Skeletal findings as above, including multilevel mild-to-moderate spinal stenosis. Electronically Signed   By: Evangeline Dakin M.D.   On: 05/13/2017 14:01    EKG: Orders placed or performed during the hospital encounter of 05/13/17  . ED EKG  . ED EKG    IMPRESSION AND PLAN: Patient is a 82 year old white female presenting for urgent care due to acute renal failure  1.  Acute renal failure likely prerenal in nature CT scan shows no obstruction We will give IV fluids hold trimetrene- HCTZ If no improvement nephrology consult  2.  UTI will treat with ceftriaxone follow urine cultures  3. Essential hypertension continue Zebeta  4.  Chronic anxiety disorder continue alprazolam Elavil  5.  Miscellaneous heparin for DVT prophylaxis  All the records are reviewed and case discussed with ED provider. Management plans discussed with the patient, family and they are in agreement.  CODE STATUS: Code Status History    This patient does not have a recorded code status. Please follow your organizational policy for patients in this situation.       TOTAL TIME TAKING CARE OF THIS PATIENT: 67minutes.    Dustin Flock M.D on 05/13/2017 at 2:13 PM  Between 7am to 6pm - Pager - [phone]  After 6pm go to [email or web address] - password [email or web address] [facility]  [facility]  Office  [phone]  CC: Primary care physician; Glean Hess, MD

## 2017-05-14 ENCOUNTER — Telehealth: Payer: Self-pay

## 2017-05-14 LAB — BASIC METABOLIC PANEL
Anion gap: 11 (ref 5–15)
BUN: 79 mg/dL — ABNORMAL HIGH (ref 6–20)
CALCIUM: 8.6 mg/dL — AB (ref 8.9–10.3)
CO2: 20 mmol/L — AB (ref 22–32)
CREATININE: 2.71 mg/dL — AB (ref 0.44–1.00)
Chloride: 106 mmol/L (ref 101–111)
GFR, EST AFRICAN AMERICAN: 18 mL/min — AB (ref >60)
GFR, EST NON AFRICAN AMERICAN: 16 mL/min — AB (ref >60)
Glucose, Bld: 103 mg/dL — ABNORMAL HIGH (ref 65–99)
Potassium: 3.2 mmol/L — ABNORMAL LOW (ref 3.5–5.1)
SODIUM: 137 mmol/L (ref 135–145)

## 2017-05-14 LAB — BLOOD CULTURE ID PANEL (REFLEXED)
ACINETOBACTER BAUMANNII: NOT DETECTED
CANDIDA ALBICANS: NOT DETECTED
CANDIDA TROPICALIS: NOT DETECTED
Candida glabrata: NOT DETECTED
Candida krusei: NOT DETECTED
Candida parapsilosis: NOT DETECTED
ENTEROBACTERIACEAE SPECIES: DETECTED — AB
ENTEROCOCCUS SPECIES: NOT DETECTED
Enterobacter cloacae complex: NOT DETECTED
Escherichia coli: DETECTED — AB
HAEMOPHILUS INFLUENZAE: NOT DETECTED
KLEBSIELLA PNEUMONIAE: NOT DETECTED
Klebsiella oxytoca: NOT DETECTED
Listeria monocytogenes: NOT DETECTED
NEISSERIA MENINGITIDIS: NOT DETECTED
PSEUDOMONAS AERUGINOSA: NOT DETECTED
Proteus species: NOT DETECTED
STREPTOCOCCUS AGALACTIAE: NOT DETECTED
STREPTOCOCCUS SPECIES: NOT DETECTED
Serratia marcescens: NOT DETECTED
Staphylococcus aureus (BCID): NOT DETECTED
Staphylococcus species: NOT DETECTED
Streptococcus pneumoniae: NOT DETECTED
Streptococcus pyogenes: NOT DETECTED

## 2017-05-14 LAB — CBC
HCT: 28.7 % — ABNORMAL LOW (ref 35.0–47.0)
Hemoglobin: 9.7 g/dL — ABNORMAL LOW (ref 12.0–16.0)
MCH: 32.1 pg (ref 26.0–34.0)
MCHC: 33.9 g/dL (ref 32.0–36.0)
MCV: 94.6 fL (ref 80.0–100.0)
PLATELETS: 134 10*3/uL — AB (ref 150–440)
RBC: 3.03 MIL/uL — ABNORMAL LOW (ref 3.80–5.20)
RDW: 14.4 % (ref 11.5–14.5)
WBC: 10.9 10*3/uL (ref 3.6–11.0)

## 2017-05-14 MED ORDER — FLUTICASONE PROPIONATE 50 MCG/ACT NA SUSP: 2.0000 | Freq: Two times a day (BID) | NASAL | Status: DC | PRN

## 2017-05-14 MED ORDER — POTASSIUM CHLORIDE CRYS ER 20 MEQ PO TBCR
40.0000 meq | EXTENDED_RELEASE_TABLET | Freq: Once | ORAL | Status: AC
  Administered 2017-05-14: 40 meq via ORAL
  Filled 2017-05-14: qty 2

## 2017-05-14 MED ORDER — SODIUM CHLORIDE 0.9 % IV SOLN
500.0000 mg | Freq: Two times a day (BID) | INTRAVENOUS | Status: DC
  Administered 2017-05-14 – 2017-05-16 (×5): 500 mg via INTRAVENOUS
  Filled 2017-05-14 (×7): qty 0.5

## 2017-05-14 MED ORDER — ALUM & MAG HYDROXIDE-SIMETH 200-200-20 MG/5ML PO SUSP: 30.0000 mL | ORAL | Status: DC | PRN

## 2017-05-14 NOTE — Progress Notes (Signed)
PHARMACY - PHYSICIAN COMMUNICATION CRITICAL VALUE ALERT - BLOOD CULTURE IDENTIFICATION (BCID)  Jodi Bartlett is an 82 y.o. female who presented to [facility] on 05/13/2017 with a chief complaint of abnormal lab (3.47) in AKI w/ CrCl 10.4 ml/min  Assessment:  Patient is having burning urination, suprapubic pain, UA shows + LE, many bacteria, WBC 15.2, CT renal study shows R calyx stone, patient has 3/4 GNR BCID Enterobacteriacaea E. Coli  Name of physician (or Provider) Contacted: Rosilyn Mings  Current antibiotics: Ceftriaxone  Changes to prescribed antibiotics recommended:  Recommendations accepted by provider -- will switch over to meropenem 500 mg IV q12h per patient's renal function, and to cover for possible ESBL E. Coli  No results found for this or any previous visit.  Tobie Lords, PharmD, BCPS Clinical Pharmacist 05/14/2017

## 2017-05-14 NOTE — Progress Notes (Signed)
[facility] [place] NAME: Jodi Bartlett    MR#:  [id number]  DATE OF BIRTH:  30-Apr-1935  SUBJECTIVE:  CHIEF COMPLAINT:  Feels better today but weak  REVIEW OF SYSTEMS:  CONSTITUTIONAL: No fever, fatigue , reports weakness.  EYES: No blurred or double vision.  EARS, NOSE, AND THROAT: No tinnitus or ear pain.  RESPIRATORY: No cough, shortness of breath, wheezing or hemoptysis.  CARDIOVASCULAR: No chest pain, orthopnea, edema.  GASTROINTESTINAL: No nausea, vomiting, diarrhea or abdominal pain.  GENITOURINARY: No dysuria, hematuria.  ENDOCRINE: No polyuria, nocturia,  HEMATOLOGY: No anemia, easy bruising or bleeding SKIN: No rash or lesion. MUSCULOSKELETAL: No joint pain or arthritis.   NEUROLOGIC: No tingling, numbness, weakness.  PSYCHIATRY: No anxiety or depression.   DRUG ALLERGIES:  No Known Allergies  VITALS:  Blood pressure (!) 113/52, pulse 84, temperature (!) 97.3 F (36.3 C), temperature source Oral, resp. rate 16, height 5\' 2"  (1.575 m), weight 58.5 kg (129 lb), SpO2 98 %.  PHYSICAL EXAMINATION:  GENERAL:  82 y.o.-year-old patient lying in the bed with no acute distress.  EYES: Pupils equal, round, reactive to light and accommodation. No scleral icterus. Extraocular muscles intact.  HEENT: Head atraumatic, normocephalic. Oropharynx and nasopharynx clear.  NECK:  Supple, no jugular venous distention. No thyroid enlargement, no tenderness.  LUNGS: Normal breath sounds bilaterally, no wheezing, rales,rhonchi or crepitation. No use of accessory muscles of respiration.  CARDIOVASCULAR: S1, S2 normal. No murmurs, rubs, or gallops.  ABDOMEN: Soft, nontender, nondistended. Bowel sounds present. No organomegaly or mass.  EXTREMITIES: No pedal edema, cyanosis, or clubbing.  NEUROLOGIC: Cranial nerves II through XII are intact. Muscle strength weak  in all extremities. Sensation intact. Gait not checked.  PSYCHIATRIC: The patient  is alert and oriented x 3.  SKIN: No obvious rash, lesion, or ulcer.    LABORATORY PANEL:   CBC Recent Labs  Lab 05/14/17 0556  WBC 10.9  HGB 9.7*  HCT 28.7*  PLT 134*   ------------------------------------------------------------------------------------------------------------------  Chemistries  Recent Labs  Lab 05/14/17 0556  NA 137  K 3.2*  CL 106  CO2 20*  GLUCOSE 103*  BUN 79*  CREATININE 2.71*  CALCIUM 8.6*   ------------------------------------------------------------------------------------------------------------------  Cardiac Enzymes No results for input(s): TROPONINI in the last 168 hours. ------------------------------------------------------------------------------------------------------------------  RADIOLOGY:  Ct Renal Stone Study  Result Date: 05/13/2017 CLINICAL DATA:  82 year old with 5 day history of generalized weakness, dizziness, lower abdominal pain and dysuria. Acute renal insufficiency. EXAM: CT ABDOMEN AND PELVIS WITHOUT CONTRAST TECHNIQUE: Multidetector CT imaging of the abdomen and pelvis was performed following the standard protocol without IV contrast. COMPARISON:  CT abdomen and pelvis with contrast 12/10/2011. FINDINGS: Lower chest: Mild parenchymal scarring in the lower lobes and right middle lobe. Visualized lung bases otherwise clear. Normal heart size. Hepatobiliary: Liver upper normal in size with a normal unenhanced appearance. Surgically absent gallbladder. No biliary ductal dilation. Pancreas: Normal unenhanced appearance. Spleen: Normal unenhanced appearance. Adrenals/Urinary Tract: Normal appearing adrenal glands. Nonobstructing very small (2 mm) calculus in a mid calyx of the right kidney. No urinary tract calculi elsewhere on either side. Scarring involving the lower pole of the right kidney. Cortical thickness otherwise well-preserved bilaterally. Approximate 1.3 cm benign cyst arising from the lower pole of the right kidney.  Allowing for the unenhanced technique, no solid renal masses. No hydronephrosis. Normal appearing decompressed urinary bladder. Stomach/Bowel: Moderate-sized hiatal hernia, unchanged. Stomach decompressed and otherwise normal in appearance. Normal-appearing small bowel.  Sigmoid colon diverticulosis without evidence of acute diverticulitis. Appendix surgically absent. Vascular/Lymphatic: Mild to moderate aorto-iliofemoral atherosclerosis without evidence of aneurysm. No pathologic lymphadenopathy. Reproductive: Surgically absent uterus no adnexal masses. Other: Edema involving the mesentery. Umbilical hernia containing fat. Bilateral inguinal hernias containing fat. Musculoskeletal: Osseous demineralization. Thoracolumbar levoscoliosis. Multilevel degenerative disc disease, spondylosis and facet degenerative changes throughout the lumbar spine. Multifactorial spinal stenosis at L3-4, L4-5 and to a lesser degree L5-S1. Degenerative changes involving the symphysis pubis. Mild degenerative changes involving both hips. No acute abnormalities. IMPRESSION: 1. Nonspecific mild edema involving the mesentery. Query mesenteric venous congestion or mild mesenteritis. 2. No acute abnormalities otherwise involving the abdomen or pelvis. 3. Nonobstructing 2 mm calculus in a mid calyx of the right kidney. No urinary tract calculi elsewhere on either side. 4. Scarring involving the lower pole the right kidney. Benign cyst involving the lower pole the right kidney. 5. Moderate-sized hiatal hernia, stable since 2013. 6. Aortic Atherosclerosis (ICD10-170.0) 7. Skeletal findings as above, including multilevel mild-to-moderate spinal stenosis. Electronically Signed   By: Evangeline Dakin M.D.   On: 05/13/2017 14:01    EKG:   Orders placed or performed during the hospital encounter of 05/13/17  . ED EKG  . ED EKG  . EKG 12-Lead  . EKG 12-Lead  . EKG    ASSESSMENT AND PLAN:   Patient is a 82 year old white female presenting  for urgent care due to acute renal failure  1.  Acute renal failure likely prerenal in nature CT scan shows no obstruction Clinically improving Cr base line is nml ; at admission  3.47-- 2.71  IV fluids hold trimetrene- HCTZ If no improvement nephrology consult  2.  UTI will treat with ceftriaxone follow urine cultures  3. Essential hypertension continue Zebeta  4.  Chronic anxiety disorder continue alprazolam Elavil  5.  Miscellaneous heparin for DVT prophylaxis  All the records are reviewed and case discussed with ED provider. Management plans discussed with the patient, family and they are in agreement       All the records are reviewed and case discussed with Care Management/Social Workerr. Management plans discussed with the patient, family and they are in agreement.  CODE STATUS: fc  TOTAL TIME TAKING CARE OF THIS PATIENT: 36 minutes.   POSSIBLE D/C IN 2  DAYS, DEPENDING ON CLINICAL CONDITION.  Note: This dictation was prepared with Dragon dictation along with smaller phrase technology. Any transcriptional errors that result from this process are unintentional.   Nicholes Mango M.D on 05/14/2017 at 10:05 PM  Between 7am to 6pm - Pager - [phone] After 6pm go to [email or web address] - password [email or web address] [facility]  [facility]  Office  [phone]  CC: Primary care physician; Glean Hess, MD

## 2017-05-14 NOTE — Progress Notes (Signed)
Family Meeting Note  Advance Directive:yes  Today a meeting took place with the Patient, daughter and brother at bedside    The following clinical team members were present during this meeting:MD  The following were discussed:Patient's diagnosis: Acute renal failure, UTI, Patient's progosis: > 12 months and Goals for treatment: Full Code daughter [name] and brother [name] are healthcare power of attorney  Additional follow-up to be provided: Hospitalist  Time spent during discussion:17 min  Nicholes Mango, MD

## 2017-05-15 LAB — CBC
HCT: 25.7 % — ABNORMAL LOW (ref 35.0–47.0)
Hemoglobin: 8.5 g/dL — ABNORMAL LOW (ref 12.0–16.0)
MCH: 31.4 pg (ref 26.0–34.0)
MCHC: 33 g/dL (ref 32.0–36.0)
MCV: 95.3 fL (ref 80.0–100.0)
PLATELETS: 144 10*3/uL — AB (ref 150–440)
RBC: 2.69 MIL/uL — ABNORMAL LOW (ref 3.80–5.20)
RDW: 14.8 % — AB (ref 11.5–14.5)
WBC: 10.5 10*3/uL (ref 3.6–11.0)

## 2017-05-15 LAB — BASIC METABOLIC PANEL
Anion gap: 6 (ref 5–15)
BUN: 63 mg/dL — AB (ref 6–20)
CALCIUM: 8.1 mg/dL — AB (ref 8.9–10.3)
CO2: 20 mmol/L — ABNORMAL LOW (ref 22–32)
CREATININE: 2.18 mg/dL — AB (ref 0.44–1.00)
Chloride: 112 mmol/L — ABNORMAL HIGH (ref 101–111)
GFR calc non Af Amer: 20 mL/min — ABNORMAL LOW (ref >60)
GFR, EST AFRICAN AMERICAN: 24 mL/min — AB (ref >60)
Glucose, Bld: 98 mg/dL (ref 65–99)
Potassium: 3.7 mmol/L (ref 3.5–5.1)
Sodium: 138 mmol/L (ref 135–145)

## 2017-05-15 LAB — URINE CULTURE

## 2017-05-15 LAB — MAGNESIUM: Magnesium: 1.9 mg/dL (ref 1.7–2.4)

## 2017-05-15 MED ORDER — PHENOL 1.4 % MT LIQD
2.0000 | OROMUCOSAL | Status: DC | PRN
  Filled 2017-05-15: qty 177

## 2017-05-15 NOTE — Progress Notes (Signed)
Jodi Bartlett at [place] NAME: Jodi Bartlett    MR#:  [id number]  DATE OF BIRTH:  1935-08-16  SUBJECTIVE:  CHIEF COMPLAINT:  Pt feeling better  REVIEW OF SYSTEMS:  CONSTITUTIONAL: No fever, fatigue , reports weakness.  EYES: No blurred or double vision.  EARS, NOSE, AND THROAT: No tinnitus or ear pain.  RESPIRATORY: No cough, shortness of breath, wheezing or hemoptysis.  CARDIOVASCULAR: No chest pain, orthopnea, edema.  GASTROINTESTINAL: No nausea, vomiting, diarrhea or abdominal pain.  GENITOURINARY: No dysuria, hematuria.  ENDOCRINE: No polyuria, nocturia,  HEMATOLOGY: No anemia, easy bruising or bleeding SKIN: No rash or lesion. MUSCULOSKELETAL: No joint pain or arthritis.   NEUROLOGIC: No tingling, numbness, weakness.  PSYCHIATRY: No anxiety or depression.   DRUG ALLERGIES:  No Known Allergies  VITALS:  Blood pressure 120/69, pulse 85, temperature 98.2 F (36.8 C), temperature source Oral, resp. rate 16, height 5\' 2"  (1.575 m), weight 129 lb (58.5 kg), SpO2 99 %.  PHYSICAL EXAMINATION:  GENERAL:  82 y.o.-year-old patient lying in the bed with no acute distress.  EYES: Pupils equal, round, reactive to light and accommodation. No scleral icterus. Extraocular muscles intact.  HEENT: Head atraumatic, normocephalic. Oropharynx and nasopharynx clear.  NECK:  Supple, no jugular venous distention. No thyroid enlargement, no tenderness.  LUNGS: Normal breath sounds bilaterally, no wheezing, rales,rhonchi or crepitation. No use of accessory muscles of respiration.  CARDIOVASCULAR: S1, S2 normal. No murmurs, rubs, or gallops.  ABDOMEN: Soft, nontender, nondistended. Bowel sounds present. No organomegaly or mass.  EXTREMITIES: No pedal edema, cyanosis, or clubbing.  NEUROLOGIC: Cranial nerves II through XII are intact. Muscle strength weak  in all extremities. Sensation intact. Gait not checked.  PSYCHIATRIC: The patient is alert and  oriented x 3.  SKIN: No obvious rash, lesion, or ulcer.    LABORATORY PANEL:   CBC Recent Labs  Lab 05/15/17 0510  WBC 10.5  HGB 8.5*  HCT 25.7*  PLT 144*   ------------------------------------------------------------------------------------------------------------------  Chemistries  Recent Labs  Lab 05/15/17 0510  NA 138  K 3.7  CL 112*  CO2 20*  GLUCOSE 98  BUN 63*  CREATININE 2.18*  CALCIUM 8.1*  MG 1.9   ------------------------------------------------------------------------------------------------------------------  Cardiac Enzymes No results for input(s): TROPONINI in the last 168 hours. ------------------------------------------------------------------------------------------------------------------  RADIOLOGY:  No results found.  EKG:   Orders placed or performed during the hospital encounter of 05/13/17  . ED EKG  . ED EKG  . EKG 12-Lead  . EKG 12-Lead  . EKG    ASSESSMENT AND PLAN:   Patient is a 82 year old white female presenting for urgent care due to acute renal failure  1.  Acute renal failure likely prerenal in nature CT scan shows no obstruction Clinically improving Cr base line is nml   IV fluids hold trimetrene- HCTZ  2.  UTI due to e. Coli and blood e. coli  3. Essential hypertension continue Zebeta  4.  Chronic anxiety disorder continue alprazolam Elavil  5.  Miscellaneous heparin for DVT prophylaxis  All the records are reviewed and case discussed with ED provider. Management plans discussed with the patient, family and they are in agreement       All the records are reviewed and case discussed with Care Management/Social Workerr. Management plans discussed with the patient, family and they are in agreement.  CODE STATUS: fc  TOTAL TIME TAKING CARE OF THIS PATIENT: 36 minutes.   POSSIBLE D/C IN 2  DAYS,  DEPENDING ON CLINICAL CONDITION.  Note: This dictation was prepared with Dragon dictation along with  smaller phrase technology. Any transcriptional errors that result from this process are unintentional.   Dustin Flock M.D on 05/15/2017 at 5:11 PM  Between 7am to 6pm - Pager - [phone] After 6pm go to [email or web address] - password [email or web address] [facility]  [place]  Office  [phone]  CC: Primary care physician; Glean Hess, MD

## 2017-05-15 NOTE — Evaluation (Signed)
Physical Therapy Evaluation Patient Details Name: Jodi Bartlett MRN: [id number] DOB: September 07, 1935 Today's Date: 05/15/2017   History of Present Illness  82 y.o. female with a known history of essential hypertension, anemia, anxiety, GERD and history of irritable bowel syndrome who was sent from urgent care to the ED for acute renal failure and UTI.  Clinical Impression  Pt is far from her baseline both physically and mental status.  She struggled to process even very basic instructions and needed multiple explanations and overall struggled with most tasks.  She was able to do some slow, labored and unsteady ambulation with walker, but attempts at walking w/o AD (hand rail, completely unable to maintain balance w/o UEs) were unsafe and unsteady, ~10 minutes of gait training for step length, use of AD, general safety/awareness.  Pt's family agrees that she is not safe to go home and far from her baseline and will need rehab.      Follow Up Recommendations SNF    Equipment Recommendations  Rolling walker with 5" wheels    Recommendations for Other Services       Precautions / Restrictions Restrictions Weight Bearing Restrictions: No      Mobility  Bed Mobility Overal bed mobility: Needs Assistance Bed Mobility: Supine to Sit;Sit to Supine     Supine to sit: Min guard Sit to supine: Min assist   General bed mobility comments: Pt typically able to get up w/o issue, she needed a lot of cuing and use of rails to get to sitting and needed assist to scoot forward to EOB as she was leaning back and unable to maintain balance.  She was unable to get herself back into bed and did need assist to get legs up and situated  Transfers Overall transfer level: Needs assistance Equipment used: Rolling walker (2 wheeled) Transfers: Sit to/from Stand Sit to Stand: Min assist         General transfer comment: Pt struggled to follow simple instructions to use UEs on bed to rise and needed  excessive cuing and close assist to keep her weight forward  Ambulation/Gait Ambulation/Gait assistance: Min guard Ambulation Distance (Feet): 80 Feet Assistive device: Rolling walker (2 wheeled)       General Gait Details: Pt with numerous small stagger steps and safety issues during ambulation.  We attempted to do some walking w/o AD using rail and pt was unsteady, unsafe and generally lacked confidence.  Typically she does not need an AD and is active in the community, to day she had very slow, labored steps and showed poor overall ambulation.   Stairs            Wheelchair Mobility    Modified Rankin (Stroke Patients Only)       Balance Overall balance assessment: Needs assistance Sitting-balance support: No upper extremity supported Sitting balance-Leahy Scale: Poor Sitting balance - Comments: Pt leaning heavily back and unable to shift weight forward and maintain upright      Standing balance-Leahy Scale: Fair Standing balance comment: heavy reliance on the walker, poor balance when we attempted to do anything w/o UEs holding                             Pertinent Vitals/Pain Pain Assessment: No/denies pain    Home Living Family/patient expects to be discharged to:: Skilled nursing facility Living Arrangements: Alone               Additional  Comments: Pt has never needed walker/AD     Prior Function Level of Independence: Independent         Comments: 82 y.o. female with a known history of essential hypertension, anemia, anxiety, GERD and history of irritable bowel syndrome who was sent from urgent care to the ED for acute renal failure.      Hand Dominance        Extremity/Trunk Assessment   Upper Extremity Assessment Upper Extremity Assessment: Generalized weakness    Lower Extremity Assessment Lower Extremity Assessment: Generalized weakness       Communication   Communication: No difficulties  Cognition  Arousal/Alertness: Awake/alert Behavior During Therapy: Impulsive Overall Cognitive Status: Impaired/Different from baseline Area of Impairment: Awareness;Safety/judgement;Problem solving                               General Comments: Pt struggles with even processing simple tasks/instructions, not at all herself      General Comments      Exercises     Assessment/Plan    PT Assessment Patient needs continued PT services  PT Problem List Decreased range of motion;Decreased strength;Decreased activity tolerance;Decreased balance;Decreased mobility;Decreased cognition;Decreased knowledge of use of DME;Decreased coordination;Decreased safety awareness;Decreased knowledge of precautions       PT Treatment Interventions Gait training;DME instruction;Functional mobility training;Therapeutic activities;Therapeutic exercise;Balance training;Neuromuscular re-education;Cognitive remediation;Patient/family education    PT Goals (Current goals can be found in the Care Plan section)  Acute Rehab PT Goals Patient Stated Goal: get stronger PT Goal Formulation: With patient Time For Goal Achievement: 05/29/17 Potential to Achieve Goals: Fair    Frequency Min 2X/week   Barriers to discharge        Co-evaluation               AM-PAC PT "6 Clicks" Daily Activity  Outcome Measure Difficulty turning over in bed (including adjusting bedclothes, sheets and blankets)?: A Lot Difficulty moving from lying on back to sitting on the side of the bed? : Unable Difficulty sitting down on and standing up from a chair with arms (e.g., wheelchair, bedside commode, etc,.)?: Unable Help needed moving to and from a bed to chair (including a wheelchair)?: A Little Help needed walking in hospital room?: A Lot Help needed climbing 3-5 steps with a railing? : A Lot 6 Click Score: 11    End of Session Equipment Utilized During Treatment: Gait belt Activity Tolerance: Patient limited by  fatigue;Patient tolerated treatment well(O2 did remain in the high 90s with ambulation) Patient left: with bed alarm set;with call bell/phone within reach;with family/visitor present   PT Visit Diagnosis: Muscle weakness (generalized) (M62.81);Difficulty in walking, not elsewhere classified (R26.2)    Time: 0998-3382 PT Time Calculation (min) (ACUTE ONLY): 29 min   Charges:   PT Evaluation $PT Eval Low Complexity: 1 Low PT Treatments $Gait Training: 8-22 mins   PT G Codes:        Kreg Shropshire, DPT 05/15/2017, 4:21 PM

## 2017-05-15 NOTE — Clinical Social Work Placement (Addendum)
   CLINICAL SOCIAL WORK PLACEMENT  NOTE  Date:  05/15/2017  Patient Details  Name: Jodi Bartlett MRN: [id number] Date of Birth: [birth year]  Clinical Social Work is seeking post-discharge placement for this patient at the [place] level of care (*CSW will initial, date and re-position this form in  chart as items are completed):  Yes   Patient/family provided with [place] Work Department's list of facilities offering this level of care within the geographic area requested by the patient (or if unable, by the patient's family).  Yes   Patient/family informed of their freedom to choose among providers that offer the needed level of care, that participate in Medicare, Medicaid or managed care program needed by the patient, have an available bed and are willing to accept the patient.  Yes   Patient/family informed of [facility]'s ownership interest in [facility] and [facility], as well as of the fact that they are under no obligation to receive care at these facilities.  PASRR submitted to EDS on 05/15/17     PASRR number received on 05/15/17     Existing PASRR number confirmed on       FL2 transmitted to all facilities in geographic area requested by pt/family on 05/15/17     FL2 transmitted to all facilities within larger geographic area on       Patient informed that his/her managed care company has contracts with or will negotiate with certain facilities, including the following:         05-16-17   Patient/family informed of bed offers received.  Patient chooses bed at  [facility]     Physician recommends and patient chooses bed at      Patient to be transferred to  [facility] on  05-16-17.  Patient to be transferred to facility by   Family's car    Patient family notified on  05-16-17 of transfer.  Name of family member notified:   Patient's brother [name] was at bedside     PHYSICIAN       Additional  Comment:    _______________________________________________ Sample, Veronia Beets, LCSW 05/15/2017, 5:24 PM  Jones Broom. Norval Morton, MSW, [place]  05/16/2017 12:03 PM Updated

## 2017-05-15 NOTE — Clinical Social Work Note (Addendum)
Clinical Social Work Assessment  Patient Details  Name: Jodi Bartlett MRN: [id number] Date of Birth: [birth year]  Date of referral:  05/15/17               Reason for consult:  Facility Placement, Discharge Planning                Permission sought to share information with:  Chartered certified accountant granted to share information::  Yes, Verbal Permission Granted  Name::      Jodi Bartlett::   Jodi Bartlett  Relationship::     Contact Information:     Housing/Transportation Living arrangements for the past 2 months:  Jodi Bartlett of Information:  Patient Patient Interpreter Needed:  None Criminal Activity/Legal Involvement Pertinent to Current Situation/Hospitalization:  No - Comment as needed Significant Relationships:  Adult Children Lives with:  Self Do you feel safe going back to the place where you live?  Yes Need for family participation in patient care:  Yes (Comment)  Care giving concerns: Patient lives alone in [name].   Social Worker assessment / plan: Holiday representative ([place]) received SNF consult. PT is recommending SNF. Social work Theatre manager met with patient alone at bedside. Patient was laying down and alert and oriented x4. Social work Theatre manager introduced self and explained the role of the [place]. Patient shared she lives alone in [place] and daughter Jodi Bartlett [phone]) is her primary contact. Social work Theatre manager explained that PT is recommending SNF and [employer] will have to authorize placement. Patient verbalized her understanding and is agreeable to SNF placement. FL2 completed and faxed out.   [employer] case manager is aware of above. CSW and social work Theatre manager will continue to follow up and assist.  Employment status:  Retired Forensic scientist:  Managed Care PT Recommendations:  [place] / Referral to community resources:  [place]  Patient/Family's Response to care: Patient is agreeable to SNF placement.  Patient/Family's Understanding of and Emotional Response to Diagnosis, Current Treatment, and Prognosis: Patient was pleasant and thanked social work Theatre manager for her assistance.  Emotional Assessment Appearance:  Appears stated age Attitude/Demeanor/Rapport:    Affect (typically observed):  Accepting, Calm, Pleasant Orientation:  Oriented to Self, Oriented to Place, Oriented to  Time, Oriented to Situation Alcohol / Substance use:  Not Applicable Psych involvement (Current and /or in the community):  No (Comment)  Discharge Needs  Concerns to be addressed:  Care Coordination, Discharge Planning Concerns Readmission within the last 30 days:  No Current discharge risk:  Dependent with Mobility Barriers to Discharge:  Continued Medical Work up   Jodi Bartlett, Student-Social Work 05/15/2017, 4:51 PM

## 2017-05-15 NOTE — NC FL2 (Signed)
[place] LEVEL OF CARE SCREENING TOOL     IDENTIFICATION  Patient Name: Jodi Bartlett Birthdate: [birth year] Sex: female Admission Date (Current Location): 05/13/2017  [place] and [place] Number:  Engineering geologist and Address:  [facility], [address] [place], [place] [address]      Provider Number: [id number]  Attending Physician Name and Address:  Dustin Flock, MD  Relative Name and Phone Number:       Current Level of Care: Hospital Recommended Level of Care: [place] Prior Approval Number:    Date Approved/Denied:   PASRR Number: [id number])  Discharge Plan: SNF    Current Diagnoses: Patient Active Problem List   Diagnosis Date Noted  . Acute renal failure (ARF) ([place]) 05/13/2017  . Degenerative lumbar spinal stenosis 01/01/2016  . Primary osteoarthritis of both hips 01/01/2016  . Insomnia 07/17/2012  . Abnormality of lung on CXR 01/21/2012  . Anemia 11/13/2011  . DIVERTICULOSIS-COLON 05/29/2009  . Vitamin D deficiency 10/11/2008  . B12 deficiency 10/04/2008  . Bunion 10/19/2007  . ESOPHAGEAL STRICTURE 10/13/2007  . IBS 10/01/2007  . Anxiety and depression 07/31/2007  . Essential hypertension 07/31/2007  . GERD 07/31/2007  . History of colonic polyps 07/31/2007    Orientation RESPIRATION BLADDER Height & Weight     Self, Time, Situation, Place  Normal Continent Weight: 129 lb (58.5 kg) Height:  5\' 2"  (157.5 cm)  BEHAVIORAL SYMPTOMS/MOOD NEUROLOGICAL BOWEL NUTRITION STATUS      Continent Diet(Diet: 2 Grams Sodium. )  AMBULATORY STATUS COMMUNICATION OF NEEDS Skin   Extensive Assist Verbally Normal                       Personal Care Assistance Level of Assistance  Bathing, Feeding, Dressing Bathing Assistance: Limited assistance Feeding assistance: Independent Dressing Assistance: Limited assistance     Functional Limitations Info  Sight, Hearing, Speech Sight Info:  Adequate Hearing Info: Adequate Speech Info: Adequate    SPECIAL CARE FACTORS FREQUENCY  PT (By licensed PT), OT (By licensed OT)     PT Frequency: (5) OT Frequency: (5)            Contractures      Additional Factors Info  Code Status, Allergies Code Status Info: (Full Code. ) Allergies Info: (No Known Allergies. )           Current Medications (05/15/2017):  This is the current hospital active medication list Current Facility-Administered Medications  Medication Dose Route Frequency Provider Last Rate Last Dose  . 0.9 %  sodium chloride infusion   Intravenous Continuous Dustin Flock, MD 75 mL/hr at 05/15/17 1054    . acetaminophen (TYLENOL) tablet 650 mg  650 mg Oral Q6H PRN Dustin Flock, MD   650 mg at 05/15/17 1553   Or  . acetaminophen (TYLENOL) suppository 650 mg  650 mg Rectal Q6H PRN Dustin Flock, MD      . ALPRAZolam Duanne Moron) tablet 0.25 mg  0.25 mg Oral QHS PRN Dustin Flock, MD      . alum & mag hydroxide-simeth (MAALOX/MYLANTA) 200-200-20 MG/5ML suspension 30 mL  30 mL Oral Q4H PRN Gouru, Aruna, MD      . amitriptyline (ELAVIL) tablet 50 mg  50 mg Oral QHS Dustin Flock, MD   50 mg at 05/14/17 2146  . bisoprolol (ZEBETA) tablet 2.5 mg  2.5 mg Oral Daily Dustin Flock, MD   2.5 mg at 05/15/17 1014  . buPROPion [facility] SR)  12 hr tablet 300 mg  300 mg Oral Daily Dustin Flock, MD   300 mg at 05/15/17 1014  . cholecalciferol (VITAMIN D) tablet 2,000 Units  2,000 Units Oral Daily Dustin Flock, MD   2,000 Units at 05/15/17 1015  . fluticasone (FLONASE) 50 MCG/ACT nasal spray 2 spray  2 spray Each Nare BID PRN Gouru, Aruna, MD      . gabapentin (NEURONTIN) capsule 200 mg  200 mg Oral QHS Dustin Flock, MD   200 mg at 05/14/17 2142  . heparin injection 5,000 Units  5,000 Units Subcutaneous Q8H Dustin Flock, MD   5,000 Units at 05/15/17 1546  . HYDROcodone-acetaminophen (NORCO/VICODIN) 5-325 MG per tablet 1-2 tablet  1-2 tablet Oral Q4H PRN  Dustin Flock, MD   2 tablet at 05/15/17 0341  . meropenem (MERREM) 500 mg in sodium chloride 0.9 % 100 mL IVPB  500 mg Intravenous Q12H Harrie Foreman, MD   Stopped at 05/15/17 1054  . ondansetron (ZOFRAN) tablet 4 mg  4 mg Oral Q6H PRN Dustin Flock, MD       Or  . ondansetron (ZOFRAN) injection 4 mg  4 mg Intravenous Q6H PRN Dustin Flock, MD      . pantoprazole (PROTONIX) EC tablet 40 mg  40 mg Oral BID Dustin Flock, MD   40 mg at 05/15/17 1015  . phenol (CHLORASEPTIC) mouth spray 2 spray  2 spray Mouth/Throat PRN Dustin Flock, MD      . vitamin B-12 (CYANOCOBALAMIN) tablet 1,000 mcg  1,000 mcg Oral Daily Dustin Flock, MD   1,000 mcg at 05/15/17 1015     Discharge Medications: Please see discharge summary for a list of discharge medications.  Relevant Imaging Results:  Relevant Lab Results:   Additional Information (SSN: [id number])  Pieter Fooks, Veronia Beets, LCSW

## 2017-05-16 DIAGNOSIS — F064 Anxiety disorder due to known physiological condition: Secondary | ICD-10-CM | POA: Diagnosis not present

## 2017-05-16 DIAGNOSIS — A4151 Sepsis due to Escherichia coli [E. coli]: Secondary | ICD-10-CM | POA: Diagnosis not present

## 2017-05-16 DIAGNOSIS — R2681 Unsteadiness on feet: Secondary | ICD-10-CM | POA: Diagnosis not present

## 2017-05-16 DIAGNOSIS — I10 Essential (primary) hypertension: Secondary | ICD-10-CM | POA: Diagnosis not present

## 2017-05-16 DIAGNOSIS — N179 Acute kidney failure, unspecified: Secondary | ICD-10-CM | POA: Diagnosis not present

## 2017-05-16 DIAGNOSIS — M25572 Pain in left ankle and joints of left foot: Secondary | ICD-10-CM | POA: Diagnosis not present

## 2017-05-16 DIAGNOSIS — D513 Other dietary vitamin B12 deficiency anemia: Secondary | ICD-10-CM | POA: Diagnosis not present

## 2017-05-16 DIAGNOSIS — E569 Vitamin deficiency, unspecified: Secondary | ICD-10-CM | POA: Diagnosis not present

## 2017-05-16 DIAGNOSIS — N390 Urinary tract infection, site not specified: Secondary | ICD-10-CM | POA: Diagnosis not present

## 2017-05-16 DIAGNOSIS — F329 Major depressive disorder, single episode, unspecified: Secondary | ICD-10-CM | POA: Diagnosis not present

## 2017-05-16 DIAGNOSIS — M6281 Muscle weakness (generalized): Secondary | ICD-10-CM | POA: Diagnosis not present

## 2017-05-16 DIAGNOSIS — F419 Anxiety disorder, unspecified: Secondary | ICD-10-CM | POA: Diagnosis not present

## 2017-05-16 DIAGNOSIS — R110 Nausea: Secondary | ICD-10-CM | POA: Diagnosis not present

## 2017-05-16 DIAGNOSIS — F3289 Other specified depressive episodes: Secondary | ICD-10-CM | POA: Diagnosis not present

## 2017-05-16 DIAGNOSIS — G2581 Restless legs syndrome: Secondary | ICD-10-CM | POA: Diagnosis not present

## 2017-05-16 DIAGNOSIS — A419 Sepsis, unspecified organism: Secondary | ICD-10-CM | POA: Diagnosis not present

## 2017-05-16 DIAGNOSIS — K219 Gastro-esophageal reflux disease without esophagitis: Secondary | ICD-10-CM | POA: Diagnosis not present

## 2017-05-16 LAB — CULTURE, BLOOD (ROUTINE X 2): Special Requests: ADEQUATE

## 2017-05-16 LAB — BASIC METABOLIC PANEL
Anion gap: 8 (ref 5–15)
BUN: 47 mg/dL — ABNORMAL HIGH (ref 6–20)
CALCIUM: 8.6 mg/dL — AB (ref 8.9–10.3)
CO2: 19 mmol/L — ABNORMAL LOW (ref 22–32)
CREATININE: 1.62 mg/dL — AB (ref 0.44–1.00)
Chloride: 116 mmol/L — ABNORMAL HIGH (ref 101–111)
GFR, EST AFRICAN AMERICAN: 34 mL/min — AB (ref >60)
GFR, EST NON AFRICAN AMERICAN: 29 mL/min — AB (ref >60)
Glucose, Bld: 102 mg/dL — ABNORMAL HIGH (ref 65–99)
Potassium: 3.3 mmol/L — ABNORMAL LOW (ref 3.5–5.1)
SODIUM: 143 mmol/L (ref 135–145)

## 2017-05-16 MED ORDER — CEFUROXIME AXETIL 500 MG PO TABS: 500.0000 mg | ORAL_TABLET | Freq: Two times a day (BID) | ORAL | 0 refills | Status: AC

## 2017-05-16 MED ORDER — BACID PO TABS
2.0000 | ORAL_TABLET | Freq: Three times a day (TID) | ORAL | Status: DC
  Filled 2017-05-16: qty 2

## 2017-05-16 MED ORDER — POTASSIUM CHLORIDE CRYS ER 20 MEQ PO TBCR
40.0000 meq | EXTENDED_RELEASE_TABLET | Freq: Once | ORAL | Status: AC
  Administered 2017-05-16: 40 meq via ORAL
  Filled 2017-05-16: qty 2

## 2017-05-16 MED ORDER — BACID PO TABS: 2.0000 | ORAL_TABLET | Freq: Three times a day (TID) | ORAL | Status: DC

## 2017-05-16 MED ORDER — GUAIFENESIN-CODEINE 100-10 MG/5ML PO SOLN: 10.0000 mL | ORAL | 0 refills | Status: DC | PRN

## 2017-05-16 MED ORDER — ALPRAZOLAM 0.25 MG PO TABS: 0.2500 mg | ORAL_TABLET | Freq: Every evening | ORAL | 0 refills | Status: DC | PRN

## 2017-05-16 MED ORDER — PHENOL 1.4 % MT LIQD: 2.0000 | OROMUCOSAL | 0 refills | Status: DC | PRN

## 2017-05-16 NOTE — Care Management Important Message (Signed)
Important Message  Patient Details  Name: Jodi Bartlett MRN: [id number] Date of Birth: 1935-09-01   Medicare Important Message Given:  Yes    Beverly Sessions, RN 05/16/2017, 11:59 AM

## 2017-05-16 NOTE — Clinical Social Work Note (Signed)
CSW presented bed offers to patient and her family and they chose [facility].  [facility] and [facility] today.  CSW received phone call from insurance company that patient has been approved, Josem Kaufmann number is [phone].  Patient to be d/c'ed today to [facility] room 509.  Patient and family agreeable to plans will transport via family's car RN to call report 500 hall nurse [phone].  Evette Cristal, MSW, [place]

## 2017-05-16 NOTE — Discharge Summary (Addendum)
[place] at [facility], 82 y.o., DOB Oct 27, 1935, MRN [id number]. Admission date: 05/13/2017 Discharge Date 05/16/2017 Primary MD Glean Hess, MD Admitting Physician Dustin Flock, MD  Admission Diagnosis  Pyelonephritis [N12] AKI (acute kidney injury) [facility]) [N17.9]  Discharge Diagnosis   Active Problems: Acute renal failure due to dehydration E. coli sepsis due to UTI E. coli UTI Essential hypertension Chronic anxiety disorder    Hospital Course  Patient is a 82 year old presented from urgent care due to acute renal Failure.  Patient is on diuretic therapy for blood pressure.  This was held and she was given IV fluids.  She also was noted to have urinary tract infection with sepsis.  Patient was treated with IV antibiotics.  She will need oral antibiotics. Patient's renal function much improved with IV hydration.  Patient is doing much better. She is very weak and deconditioned need of rehab.  Ceftin 500 mg bid for 10 days            Consults  None  Significant Tests:  See full reports for all details     Ct Renal Stone Study  Result Date: 05/13/2017 CLINICAL DATA:  82 year old with 5 day history of generalized weakness, dizziness, lower abdominal pain and dysuria. Acute renal insufficiency. EXAM: CT ABDOMEN AND PELVIS WITHOUT CONTRAST TECHNIQUE: Multidetector CT imaging of the abdomen and pelvis was performed following the standard protocol without IV contrast. COMPARISON:  CT abdomen and pelvis with contrast 12/10/2011. FINDINGS: Lower chest: Mild parenchymal scarring in the lower lobes and right middle lobe. Visualized lung bases otherwise clear. Normal heart size. Hepatobiliary: Liver upper normal in size with a normal unenhanced appearance. Surgically absent gallbladder. No biliary ductal dilation. Pancreas: Normal unenhanced appearance. Spleen: Normal unenhanced appearance. Adrenals/Urinary Tract: Normal appearing  adrenal glands. Nonobstructing very small (2 mm) calculus in a mid calyx of the right kidney. No urinary tract calculi elsewhere on either side. Scarring involving the lower pole of the right kidney. Cortical thickness otherwise well-preserved bilaterally. Approximate 1.3 cm benign cyst arising from the lower pole of the right kidney. Allowing for the unenhanced technique, no solid renal masses. No hydronephrosis. Normal appearing decompressed urinary bladder. Stomach/Bowel: Moderate-sized hiatal hernia, unchanged. Stomach decompressed and otherwise normal in appearance. Normal-appearing small bowel. Sigmoid colon diverticulosis without evidence of acute diverticulitis. Appendix surgically absent. Vascular/Lymphatic: Mild to moderate aorto-iliofemoral atherosclerosis without evidence of aneurysm. No pathologic lymphadenopathy. Reproductive: Surgically absent uterus no adnexal masses. Other: Edema involving the mesentery. Umbilical hernia containing fat. Bilateral inguinal hernias containing fat. Musculoskeletal: Osseous demineralization. Thoracolumbar levoscoliosis. Multilevel degenerative disc disease, spondylosis and facet degenerative changes throughout the lumbar spine. Multifactorial spinal stenosis at L3-4, L4-5 and to a lesser degree L5-S1. Degenerative changes involving the symphysis pubis. Mild degenerative changes involving both hips. No acute abnormalities. IMPRESSION: 1. Nonspecific mild edema involving the mesentery. Query mesenteric venous congestion or mild mesenteritis. 2. No acute abnormalities otherwise involving the abdomen or pelvis. 3. Nonobstructing 2 mm calculus in a mid calyx of the right kidney. No urinary tract calculi elsewhere on either side. 4. Scarring involving the lower pole the right kidney. Benign cyst involving the lower pole the right kidney. 5. Moderate-sized hiatal hernia, stable since 2013. 6. Aortic Atherosclerosis (ICD10-170.0) 7. Skeletal findings as above, including  multilevel mild-to-moderate spinal stenosis. Electronically Signed   By: Evangeline Dakin M.D.   On: 05/13/2017 14:01       Today   Subjective:   [name] feeling much  better denies any complaints Objective:   Blood pressure (!) 139/54, pulse 85, temperature (!) 97.4 F (36.3 C), temperature source Oral, resp. rate 18, height 5\' 2"  (1.575 m), weight 129 lb (58.5 kg), SpO2 98 %.  .  Intake/Output Summary (Last 24 hours) at 05/16/2017 1123 Last data filed at 05/16/2017 0729 Gross per 24 hour  Intake 580 ml  Output 850 ml  Net -270 ml    Exam VITAL SIGNS: Blood pressure (!) 139/54, pulse 85, temperature (!) 97.4 F (36.3 C), temperature source Oral, resp. rate 18, height 5\' 2"  (1.575 m), weight 129 lb (58.5 kg), SpO2 98 %.  GENERAL:  82 y.o.-year-old patient lying in the bed with no acute distress.  EYES: Pupils equal, round, reactive to light and accommodation. No scleral icterus. Extraocular muscles intact.  HEENT: Head atraumatic, normocephalic. Oropharynx and nasopharynx clear.  NECK:  Supple, no jugular venous distention. No thyroid enlargement, no tenderness.  LUNGS: Normal breath sounds bilaterally, no wheezing, rales,rhonchi or crepitation. No use of accessory muscles of respiration.  CARDIOVASCULAR: S1, S2 normal. No murmurs, rubs, or gallops.  ABDOMEN: Soft, nontender, nondistended. Bowel sounds present. No organomegaly or mass.  EXTREMITIES: No pedal edema, cyanosis, or clubbing.  NEUROLOGIC: Cranial nerves II through XII are intact. Muscle strength 5/5 in all extremities. Sensation intact. Gait not checked.  PSYCHIATRIC: The patient is alert and oriented x 3.  SKIN: No obvious rash, lesion, or ulcer.   Data Review     CBC w Diff:  Lab Results  Component Value Date   WBC 10.5 05/15/2017   HGB 8.5 (L) 05/15/2017   HGB 11.4 05/21/2016   HCT 25.7 (L) 05/15/2017   HCT 35.4 05/21/2016   PLT 144 (L) 05/15/2017   PLT 347 05/21/2016   LYMPHOPCT 4 05/13/2017    MONOPCT 9 05/13/2017   EOSPCT 1 05/13/2017   BASOPCT 0 05/13/2017   CMP:  Lab Results  Component Value Date   NA 143 05/16/2017   NA 141 05/21/2016   K 3.3 (L) 05/16/2017   CL 116 (H) 05/16/2017   CO2 19 (L) 05/16/2017   BUN 47 (H) 05/16/2017   BUN 18 05/21/2016   CREATININE 1.62 (H) 05/16/2017   CREATININE 0.95 12/10/2011   PROT 6.8 05/21/2016   ALBUMIN 4.6 05/21/2016   BILITOT 0.3 05/21/2016   ALKPHOS 77 05/21/2016   AST 19 05/21/2016   ALT 17 05/21/2016  .  Micro Results Recent Results (from the past 240 hour(s))  Rapid Influenza A&B Antigens ([place] only)     Status: None   Collection Time: 05/13/17 11:25 AM  Result Value Ref Range Status   Influenza A ([place]) NEGATIVE NEGATIVE Final   Influenza B ([place]) NEGATIVE NEGATIVE Final    Comment: Performed at [facility], [address], [place], [place] [address]  Urine culture     Status: Abnormal   Collection Time: 05/13/17 11:25 AM  Result Value Ref Range Status   Specimen Description   Final    URINE, CLEAN CATCH Performed at [facility], [address] [place], [place] [address]    Special Requests   Final    NONE Performed at [facility], [address] [place],  [address]    Culture >=100,000 COLONIES/mL ESCHERICHIA COLI (A)  Final   Report Status 05/15/2017 FINAL  Final   Organism ID, Bacteria ESCHERICHIA COLI (A)  Final      Susceptibility   Escherichia coli - MIC*  AMPICILLIN <=2 SENSITIVE Sensitive     CEFAZOLIN <=4 SENSITIVE Sensitive     CEFTRIAXONE <=1 SENSITIVE Sensitive     CIPROFLOXACIN <=0.25 SENSITIVE Sensitive     GENTAMICIN <=1 SENSITIVE Sensitive     IMIPENEM <=0.25 SENSITIVE Sensitive     NITROFURANTOIN <=16 SENSITIVE Sensitive     TRIMETH/SULFA <=20 SENSITIVE Sensitive     AMPICILLIN/SULBACTAM <=2 SENSITIVE Sensitive     PIP/TAZO <=4 SENSITIVE Sensitive     Extended ESBL NEGATIVE Sensitive     * >=100,000 COLONIES/mL ESCHERICHIA COLI   Blood culture (routine x 2)     Status: Abnormal   Collection Time: 05/13/17  2:09 PM  Result Value Ref Range Status   Specimen Description BLOOD BLOOD LEFT ARM  Final   Special Requests   Final    BOTTLES DRAWN AEROBIC AND ANAEROBIC Blood Culture adequate volume   Culture  Setup Time   Final    GRAM NEGATIVE RODS IN BOTH AEROBIC AND ANAEROBIC BOTTLES CRITICAL RESULT CALLED TO, READ BACK BY AND VERIFIED WITH: DAVID BESANTI AT [place] ON 05/14/17 [place]. Performed at [facility], [place] [address], [place], [place] [address]    Culture ESCHERICHIA COLI (A)  Final   Report Status 05/16/2017 FINAL  Final   Organism ID, Bacteria ESCHERICHIA COLI  Final      Susceptibility   Escherichia coli - MIC*    AMPICILLIN <=2 SENSITIVE Sensitive     CEFAZOLIN <=4 SENSITIVE Sensitive     CEFEPIME <=1 SENSITIVE Sensitive     CEFTAZIDIME <=1 SENSITIVE Sensitive     CEFTRIAXONE <=1 SENSITIVE Sensitive     CIPROFLOXACIN <=0.25 SENSITIVE Sensitive     GENTAMICIN <=1 SENSITIVE Sensitive     IMIPENEM <=0.25 SENSITIVE Sensitive     TRIMETH/SULFA <=20 SENSITIVE Sensitive     AMPICILLIN/SULBACTAM <=2 SENSITIVE Sensitive     PIP/TAZO <=4 SENSITIVE Sensitive     Extended ESBL NEGATIVE Sensitive     * ESCHERICHIA COLI  Blood culture (routine x 2)     Status: None (Preliminary result)   Collection Time: 05/13/17  2:09 PM  Result Value Ref Range Status   Specimen Description   Final    BLOOD BLOOD RIGHT ARM Performed at [facility], [address], [place], [place] [address]    Special Requests   Final    BOTTLES DRAWN AEROBIC AND ANAEROBIC Blood Culture results may not be optimal due to an excessive volume of blood received in culture bottles Performed at [facility] [address] [place], [place] [address]    Culture  Setup Time   Final    GRAM NEGATIVE RODS IN BOTH AEROBIC AND ANAEROBIC BOTTLES CRITICAL RESULT CALLED TO, READ BACK BY AND VERIFIED WITH: DAVID BESANTI AT 0559 ON  05/14/17 [place]. Performed at [facility], [address] [place], [place] [address]    Culture GRAM NEGATIVE RODS  Final   Report Status PENDING  Incomplete  Blood Culture ID Panel (Reflexed)     Status: Abnormal   Collection Time: 05/13/17  2:09 PM  Result Value Ref Range Status   Enterococcus species NOT DETECTED NOT DETECTED Final   Listeria monocytogenes NOT DETECTED NOT DETECTED Final   Staphylococcus species NOT DETECTED NOT DETECTED Final   Staphylococcus aureus NOT DETECTED NOT DETECTED Final   Streptococcus species NOT DETECTED NOT DETECTED Final   Streptococcus agalactiae NOT DETECTED NOT DETECTED Final   Streptococcus pneumoniae NOT DETECTED NOT DETECTED Final  Streptococcus pyogenes NOT DETECTED NOT DETECTED Final   Acinetobacter baumannii NOT DETECTED NOT DETECTED Final   Enterobacteriaceae species DETECTED (A) NOT DETECTED Final    Comment: Enterobacteriaceae represent a large family of gram-negative bacteria, not a single organism. CRITICAL RESULT CALLED TO, READ BACK BY AND VERIFIED WITH: DAVID BESANTI AT 0559 ON 05/14/17 [place].    Enterobacter cloacae complex NOT DETECTED NOT DETECTED Final   Escherichia coli DETECTED (A) NOT DETECTED Final    Comment: CRITICAL RESULT CALLED TO, READ BACK BY AND VERIFIED WITH: DAVID BESANTI AT 0559 ON 05/14/17 [place].    Klebsiella oxytoca NOT DETECTED NOT DETECTED Final   Klebsiella pneumoniae NOT DETECTED NOT DETECTED Final   Proteus species NOT DETECTED NOT DETECTED Final   Serratia marcescens NOT DETECTED NOT DETECTED Final   Haemophilus influenzae NOT DETECTED NOT DETECTED Final   Neisseria meningitidis NOT DETECTED NOT DETECTED Final   Pseudomonas aeruginosa NOT DETECTED NOT DETECTED Final   Candida albicans NOT DETECTED NOT DETECTED Final   Candida glabrata NOT DETECTED NOT DETECTED Final   Candida krusei NOT DETECTED NOT DETECTED Final   Candida parapsilosis NOT DETECTED NOT DETECTED Final   Candida tropicalis NOT  DETECTED NOT DETECTED Final    Comment: Performed at [facility], [address] [place], [place] [address]        Code Status Orders  (From admission, onward)        Start     Ordered   05/13/17 1649  Full code  Continuous     05/13/17 1648    Code Status History    Date Active Date Inactive Code Status Order ID Comments User Context   This patient has a current code status but no historical code status.          Follow-up Information    Glean Hess, MD Follow up in 2 week(s).   Specialty:  Internal Medicine Why:  hospital f/u Contact information: [address] [place] [address] [phone]           Discharge Medications   Allergies as of 05/16/2017   No Known Allergies     Medication List    STOP taking these medications   triamterene-hydrochlorothiazide 37.5-25 MG capsule Commonly known as:  DYAZIDE     TAKE these medications   acetaminophen 500 MG tablet Commonly known as:  TYLENOL Take 500 mg by mouth every 6 (six) hours as needed.   ALPRAZolam 0.25 MG tablet Commonly known as:  XANAX Take 1 tablet (0.25 mg total) by mouth at bedtime as needed.   amitriptyline 50 MG tablet Commonly known as:  ELAVIL Take 50 mg by mouth at bedtime.   bisoprolol 5 MG tablet Commonly known as:  ZEBETA TAKE ONE-HALF (1/2) TABLET BY MOUTH DAILY   cefUROXime 500 MG tablet Commonly known as:  CEFTIN Take 1 tablet (500 mg total) by mouth 2 (two) times daily for 10 days.   gabapentin 100 MG capsule Commonly known as:  NEURONTIN Take 1-3 capsules by mouth at bedtime for RLS   guaiFENesin-codeine 100-10 MG/5ML syrup Take 10 mLs by mouth every 4 (four) hours as needed for cough. What changed:    how much to take  when to take this   lactobacillus acidophilus Tabs tablet Take 2 tablets by mouth 3 (three) times daily.   pantoprazole 40 MG tablet Commonly known as:  PROTONIX Take 1 tablet (40 mg total) by mouth 2 (two)  times daily.  phenol 1.4 % Liqd Commonly known as:  CHLORASEPTIC Use as directed 2 sprays in the mouth or throat as needed for throat irritation / pain.   vitamin B-12 1000 MCG tablet Commonly known as:  CYANOCOBALAMIN Take 1,000 mcg by mouth daily.   Vitamin D3 1000 units Caps Take 2,000 Units by mouth daily.   WELLBUTRIN SR 150 MG 12 hr tablet Generic drug:  buPROPion Take 300 mg by mouth daily.          Total Time in preparing paper work, data evaluation and todays exam - 68 minutes  Dustin Flock M.D on 05/16/2017 at 11:23 AM  [facility]   Office  [phone]

## 2017-05-16 NOTE — Discharge Instructions (Signed)
[place] at [place]:  Cardiac diet  DISCHARGE CONDITION:  Stable  ACTIVITY:  Activity as tolerated  OXYGEN:  Home Oxygen: No.   Oxygen Delivery: room air  DISCHARGE LOCATION:  nursing home    ADDITIONAL DISCHARGE INSTRUCTION:   If you experience worsening of your admission symptoms, develop shortness of breath, life threatening emergency, suicidal or homicidal thoughts you must seek medical attention immediately by calling 911 or calling your MD immediately  if symptoms less severe.  You Must read complete instructions/literature along with all the possible adverse reactions/side effects for all the Medicines you take and that have been prescribed to you. Take any new Medicines after you have completely understood and accpet all the possible adverse reactions/side effects.   Please note  You were cared for by a hospitalist during your hospital stay. If you have any questions about your discharge medications or the care you received while you were in the hospital after you are discharged, you can call the unit and asked to speak with the hospitalist on call if the hospitalist that took care of you is not available. Once you are discharged, your primary care physician will handle any further medical issues. Please note that NO REFILLS for any discharge medications will be authorized once you are discharged, as it is imperative that you return to your primary care physician (or establish a relationship with a primary care physician if you do not have one) for your aftercare needs so that they can reassess your need for medications and monitor your lab values.

## 2017-05-19 ENCOUNTER — Telehealth: Payer: Self-pay

## 2017-05-19 NOTE — Telephone Encounter (Signed)
TOC #1. Called to f/u after d/c from [facility] on 05/16/17. Also wanted to confirm hosp f/u appt w/ Dr. Army Melia on 05/23/17 @ 3:15pm. Discharge planning included:  - d/c to [facility] [place] - start Ceftin, probiotic and chloraseptic - d/c Triamterene/HCTZ  - change cough suppressant Wanted to ensure this has been taken care of as well. LVM requesting returned call.

## 2017-05-20 ENCOUNTER — Telehealth: Payer: Self-pay

## 2017-05-20 DIAGNOSIS — F419 Anxiety disorder, unspecified: Secondary | ICD-10-CM | POA: Diagnosis not present

## 2017-05-20 DIAGNOSIS — G2581 Restless legs syndrome: Secondary | ICD-10-CM | POA: Diagnosis not present

## 2017-05-20 DIAGNOSIS — M6281 Muscle weakness (generalized): Secondary | ICD-10-CM | POA: Diagnosis not present

## 2017-05-20 DIAGNOSIS — R110 Nausea: Secondary | ICD-10-CM | POA: Diagnosis not present

## 2017-05-20 DIAGNOSIS — K219 Gastro-esophageal reflux disease without esophagitis: Secondary | ICD-10-CM | POA: Diagnosis not present

## 2017-05-20 DIAGNOSIS — N179 Acute kidney failure, unspecified: Secondary | ICD-10-CM | POA: Diagnosis not present

## 2017-05-20 DIAGNOSIS — F329 Major depressive disorder, single episode, unspecified: Secondary | ICD-10-CM | POA: Diagnosis not present

## 2017-05-20 DIAGNOSIS — I10 Essential (primary) hypertension: Secondary | ICD-10-CM | POA: Diagnosis not present

## 2017-05-20 DIAGNOSIS — A4151 Sepsis due to Escherichia coli [E. coli]: Secondary | ICD-10-CM | POA: Diagnosis not present

## 2017-05-20 NOTE — Telephone Encounter (Signed)
TOC #2. Called to f/u after d/c from [facility] on 05/16/17. Also wanted to confirm hosp f/u appt w/ Dr. Army Melia on 05/23/17 @ 3:15pm. Discharge planning includes the following: - d/c to [facility] [place] - start Ceftin, probiotic and chloraseptic - d/c Triamterene/HCTZ  - change cough suppressant As part of the TOC f/u call, I am also wanting to discuss/review the above information with the pt to ensure all of the above have/has been taken care of. LVM requesting returned call.

## 2017-05-23 ENCOUNTER — Inpatient Hospital Stay: Payer: PPO | Admitting: Internal Medicine

## 2017-05-23 NOTE — Progress Notes (Deleted)
Date:  05/23/2017   Name:  Jodi Bartlett   DOB:  1936/01/27   MRN:  [id number]   Chief Complaint: No chief complaint on file. Hospital follow up from Acute Renal failure. Admission date: 05/13/2017 Discharge Date 05/16/2017  Admission Diagnosis  Pyelonephritis [N12] AKI (acute kidney injury) [facility]) [N17.9]  Discharge Diagnosis   Active Problems: Acute renal failure due to dehydration E. coli sepsis due to UTI E. coli UTI Essential hypertension Chronic anxiety disorder  She received a call for TOC on 05/19/17 and 05/20/17 but never returned a call.  She was discharged to [facility] so may not have access to messages. She was discharged on Ceftin.  HCTZ was stopped.  Review of Systems  Patient Active Problem List   Diagnosis Date Noted  . Acute renal failure (ARF) ([place]) 05/13/2017  . Degenerative lumbar spinal stenosis 01/01/2016  . Primary osteoarthritis of both hips 01/01/2016  . Insomnia 07/17/2012  . Abnormality of lung on CXR 01/21/2012  . Anemia 11/13/2011  . DIVERTICULOSIS-COLON 05/29/2009  . Vitamin D deficiency 10/11/2008  . B12 deficiency 10/04/2008  . Bunion 10/19/2007  . ESOPHAGEAL STRICTURE 10/13/2007  . IBS 10/01/2007  . Anxiety and depression 07/31/2007  . Essential hypertension 07/31/2007  . GERD 07/31/2007  . History of colonic polyps 07/31/2007    Prior to Admission medications   Medication Sig Start Date End Date Taking? Authorizing Provider  acetaminophen (TYLENOL) 500 MG tablet Take 500 mg by mouth every 6 (six) hours as needed.    [provider]  ALPRAZolam Duanne Moron) 0.25 MG tablet Take 1 tablet (0.25 mg total) by mouth at bedtime as needed. 05/16/17   Dustin Flock, MD  amitriptyline (ELAVIL) 50 MG tablet Take 50 mg by mouth at bedtime.    Lew Dawes, MD  bisoprolol (ZEBETA) 5 MG tablet TAKE ONE-HALF (1/2) TABLET BY MOUTH DAILY 05/21/16   Glean Hess, MD  buPROPion [facility] SR) 150 MG 12 hr tablet Take 300 mg by mouth  daily.     Lew Dawes, MD  cefUROXime (CEFTIN) 500 MG tablet Take 1 tablet (500 mg total) by mouth 2 (two) times daily for 10 days. 05/16/17 05/26/17  Dustin Flock, MD  Cholecalciferol (VITAMIN D3) 1000 units CAPS Take 2,000 Units by mouth daily.    [provider]  gabapentin (NEURONTIN) 100 MG capsule Take 1-3 capsules by mouth at bedtime for RLS 03/07/14   Lew Dawes, MD  guaiFENesin-codeine 100-10 MG/5ML syrup Take 10 mLs by mouth every 4 (four) hours as needed for cough. 05/16/17   Dustin Flock, MD  lactobacillus acidophilus (BACID) TABS tablet Take 2 tablets by mouth 3 (three) times daily. 05/16/17   Dustin Flock, MD  pantoprazole (PROTONIX) 40 MG tablet Take 1 tablet (40 mg total) by mouth 2 (two) times daily. 05/21/16   Glean Hess, MD  phenol (CHLORASEPTIC) 1.4 % LIQD Use as directed 2 sprays in the mouth or throat as needed for throat irritation / pain. 05/16/17   Dustin Flock, MD  vitamin B-12 (CYANOCOBALAMIN) 1000 MCG tablet Take 1,000 mcg by mouth daily.    [provider]    No Known Allergies  Past Surgical History:  Procedure Laterality Date  . ABDOMINAL HYSTERECTOMY    . APPENDECTOMY    . breast biopsy- benign    . BREAST CYST EXCISION Left   . bunion/morton's neuroma  4/07   right foot   . carpal tunnel release-right    . CHOLECYSTECTOMY  1994  . colitis transfusion     hosp at 25  . COLONOSCOPY     very difficul- supp with BE past  . CT HEAD (other)  6/10   small vessel chronic change/ no acute findings  . EGD tight ues sphincter  7/09   with dialtion  . FOOT SURGERY  10/09   TMT fusion and bunionectomy  . HEMORRHOID SURGERY    . hysterectomy (other)     age 73; ovarian cancer (1962)  . INCISIONAL HERNIA REPAIR     after ccy  . left knee surgery    . left shoulder replacement    . OOPHORECTOMY Bilateral 1962  . right shoulder surgery    . stress cardiolite  9/10  . VIDEO BRONCHOSCOPY  04/22/2012   Procedure: VIDEO  BRONCHOSCOPY WITH FLUORO;  Surgeon: Juanito Doom, MD;  Location: [facility];  Service: Cardiopulmonary;  Laterality: Bilateral;    Social History   Tobacco Use  . Smoking status: Never Smoker  . Smokeless tobacco: Never Used  Substance Use Topics  . Alcohol use: Yes    Comment: social wine   . Drug use: No     Medication list has been reviewed and updated.  PHQ 2/9 Scores 11/18/2016 05/21/2016 01/01/2016 08/03/2013  PHQ - 2 Score 0 0 0 0  PHQ- 9 Score 0 - - -    Physical Exam  There were no vitals taken for this visit.  Assessment and Plan:

## 2017-05-26 ENCOUNTER — Encounter: Payer: PPO | Admitting: Internal Medicine

## 2017-05-26 ENCOUNTER — Ambulatory Visit: Payer: PPO

## 2017-05-27 DIAGNOSIS — A4151 Sepsis due to Escherichia coli [E. coli]: Secondary | ICD-10-CM | POA: Diagnosis not present

## 2017-05-27 DIAGNOSIS — M6281 Muscle weakness (generalized): Secondary | ICD-10-CM | POA: Diagnosis not present

## 2017-05-27 DIAGNOSIS — M25572 Pain in left ankle and joints of left foot: Secondary | ICD-10-CM | POA: Diagnosis not present

## 2017-05-27 DIAGNOSIS — N179 Acute kidney failure, unspecified: Secondary | ICD-10-CM | POA: Diagnosis not present

## 2017-05-28 ENCOUNTER — Telehealth: Payer: Self-pay

## 2017-05-28 ENCOUNTER — Other Ambulatory Visit: Payer: Self-pay | Admitting: Licensed Clinical Social Worker

## 2017-05-28 NOTE — Telephone Encounter (Signed)
Called pt to sched AWV w/ [facility]. Unable to LVM d/t no answer.  

## 2017-05-28 NOTE — Patient Outreach (Signed)
[facility]) Care Management  05/28/2017  Jodi Bartlett Mar 31, 1936 [id number]  Assessment- [facility] CSW is currently providing coverage for [facility]. CSW received new referral that stated patient is in a SNF and needing [facility] Care Management involvement. CSW completed chart review and saw that patient is at [facility]. CSW completed call to SNF and confirmed that patient is there in room 509. CSW was transferred to SNF social worker Janett Billow but was unable to reach her. CSW left a voice message encouraging a return call with updates on patient.  Plan-CSW will await for return call from SNF and update [facility] CSW Chrystal Land upon her return.  Eula Fried, BSW, MSW, [email or web address] Phone: [phone] Fax: [phone]

## 2017-05-29 ENCOUNTER — Other Ambulatory Visit: Payer: Self-pay | Admitting: Licensed Clinical Social Worker

## 2017-05-29 NOTE — Patient Outreach (Signed)
[facility]) Care Management  05/29/2017  Jodi Bartlett 1935/04/27 [id number]  CSW received return call from [facility] SNF social worker Donnalee Curry. CSW was notified that patient's insurance notified SNF that they would only cover until 2/29/19 and family completed an appeal today. Family is hopeful that appeal will be approved. CSW appreciative of update.   Eula Fried, BSW, [email or web address], [email or web address] Phone: [phone] Fax: [phone]

## 2017-06-03 ENCOUNTER — Other Ambulatory Visit: Payer: Self-pay | Admitting: *Deleted

## 2017-06-03 NOTE — Patient Outreach (Signed)
[facility]) Care Management  06/03/2017  ANDREAH GOHEEN 1935/11/04 [id number]   Telephone screening for Transition of care  Referral received 06/03/17 Referral source : Health team advantage Referral reason : Patient with discharge from inpatient admission from [facility] on 05/31/17.  Unsuccessful telephone outreach call, able to leave a HIPAA compliant message with information for return call.  Plan Will schedule 2nd call attempt in the next business day   Joylene Draft, RN, [place] Management Coordinator  [phone]- Mobile [phone]- [place]

## 2017-06-04 ENCOUNTER — Ambulatory Visit (INDEPENDENT_AMBULATORY_CARE_PROVIDER_SITE_OTHER): Payer: PPO | Admitting: Internal Medicine

## 2017-06-04 ENCOUNTER — Other Ambulatory Visit: Payer: Self-pay | Admitting: *Deleted

## 2017-06-04 ENCOUNTER — Encounter: Payer: Self-pay | Admitting: Internal Medicine

## 2017-06-04 VITALS — BP 138/76 | HR 76 | Ht 62.0 in | Wt 155.8 lb

## 2017-06-04 DIAGNOSIS — D649 Anemia, unspecified: Secondary | ICD-10-CM

## 2017-06-04 DIAGNOSIS — I10 Essential (primary) hypertension: Secondary | ICD-10-CM

## 2017-06-04 DIAGNOSIS — E538 Deficiency of other specified B group vitamins: Secondary | ICD-10-CM | POA: Diagnosis not present

## 2017-06-04 DIAGNOSIS — N390 Urinary tract infection, site not specified: Secondary | ICD-10-CM | POA: Diagnosis not present

## 2017-06-04 DIAGNOSIS — A4151 Sepsis due to Escherichia coli [E. coli]: Secondary | ICD-10-CM

## 2017-06-04 DIAGNOSIS — N179 Acute kidney failure, unspecified: Secondary | ICD-10-CM

## 2017-06-04 LAB — POC URINALYSIS WITH MICROSCOPIC (NON AUTO)MANUAL RESULT
BILIRUBIN UA: NEGATIVE
Crystals: 0
GLUCOSE UA: NEGATIVE
Ketones, UA: NEGATIVE
Mucus, UA: 0
NITRITE UA: POSITIVE
RBC: 2 M/uL — AB (ref 4.04–5.48)
Spec Grav, UA: 1.01 (ref 1.010–1.025)
UROBILINOGEN UA: 0.2 U/dL
WBC Casts, UA: >50
pH, UA: 6 (ref 5.0–8.0)

## 2017-06-04 MED ORDER — CYANOCOBALAMIN 1000 MCG/ML IJ SOLN
1000.0000 ug | Freq: Once | INTRAMUSCULAR | Status: AC
  Administered 2017-06-04: 1000 ug via INTRAMUSCULAR

## 2017-06-04 NOTE — Addendum Note (Signed)
Addended by: Anthonette Legato on: 06/04/2017 04:58 PM   Modules accepted: Orders

## 2017-06-04 NOTE — Patient Outreach (Signed)
[facility]) Care Management  06/04/2017  Jodi Bartlett 1936-01-03 [id number]   2nd call attempt for transition of care   Referral received 06/03/17 Referral source : Health team advantage Referral reason : Patient with discharge from inpatient admission from [facility] on 05/31/17.  Unsuccessful telephone outreach call, able to leave a HIPAA compliant message with information for return call.   Plan If no return call today, will send unsuccessful outreach letter and schedule next call within 10 days for 3rd call attempt.   Joylene Draft, RN, [place] Management Coordinator  [phone]- Mobile [phone]- Toll Free Main Office

## 2017-06-04 NOTE — Progress Notes (Signed)
[place]   Date:  06/04/2017   Name:  Jodi Bartlett   DOB:  11-06-1935   MRN:  [id number]   Chief Complaint: Hospitalization Follow-up (F/up from rehab.) Patient presents for follow up since hospitalization and rehab placement. She was admitted to [facility] 05/13/17 to 05/16/17.  Treated with antibiotics and IVF. She was treated with Ceftin and dyazide was discontinued. She was also found to be moderately anemic which did not appear to be addressed during the admission.  She has a history of mild anemia with low serum ferritin and low B12. Discharged to [facility] on 05/16/17 and discharged home. Discharge Diagnosis   Active Problems: Acute renal failure due to dehydration E. coli sepsis due to UTI E. coli UTI Essential hypertension Chronic anxiety disorder  She had repeat labs in rehab.  Hgb was persistently 7.1 -7.3.  Renal function returned to normal.  Urine cx was negative. She was given carafate qid but no iron supplement or B12. Blood pressure medication was increased to 2.5 mg bid.  Lab Results  Component Value Date   CREATININE 1.62 (H) 05/16/2017   BUN 47 (H) 05/16/2017   NA 143 05/16/2017   K 3.3 (L) 05/16/2017   CL 116 (H) 05/16/2017   CO2 19 (L) 05/16/2017     Review of Systems  Constitutional: Positive for fatigue. Negative for chills, diaphoresis, fever and unexpected weight change.  Respiratory: Negative for cough, chest tightness, shortness of breath and wheezing.   Cardiovascular: Positive for leg swelling (mild ankle edema since stopping diuretic). Negative for chest pain.  Gastrointestinal: Positive for abdominal pain (mild epigastric pain).  Genitourinary: Negative for difficulty urinating, dysuria, frequency and hematuria.  Skin: Negative for rash.  Neurological: Positive for dizziness, weakness and light-headedness. Negative for tremors and syncope.  Hematological: Negative for adenopathy.  Psychiatric/Behavioral: Negative for sleep disturbance.    Patient  Active Problem List   Diagnosis Date Noted  . Acute renal failure (ARF) ([place]) 05/13/2017  . Degenerative lumbar spinal stenosis 01/01/2016  . Primary osteoarthritis of both hips 01/01/2016  . Insomnia 07/17/2012  . Abnormality of lung on CXR 01/21/2012  . Anemia 11/13/2011  . DIVERTICULOSIS-COLON 05/29/2009  . Vitamin D deficiency 10/11/2008  . B12 deficiency 10/04/2008  . Bunion 10/19/2007  . ESOPHAGEAL STRICTURE 10/13/2007  . IBS 10/01/2007  . Anxiety and depression 07/31/2007  . Essential hypertension 07/31/2007  . GERD 07/31/2007  . History of colonic polyps 07/31/2007    Prior to Admission medications   Medication Sig Start Date End Date Taking? Authorizing Provider  acetaminophen (TYLENOL) 500 MG tablet Take 500 mg by mouth every 6 (six) hours as needed.    [provider]  ALPRAZolam Duanne Moron) 0.25 MG tablet Take 1 tablet (0.25 mg total) by mouth at bedtime as needed. 05/16/17   Dustin Flock, MD  amitriptyline (ELAVIL) 50 MG tablet Take 50 mg by mouth at bedtime.    Lew Dawes, MD  bisoprolol (ZEBETA) 5 MG tablet TAKE ONE-HALF (1/2) TABLET BY MOUTH DAILY 05/21/16   Glean Hess, MD  buPROPion [facility] SR) 150 MG 12 hr tablet Take 300 mg by mouth daily.     Lew Dawes, MD  Cholecalciferol (VITAMIN D3) 1000 units CAPS Take 2,000 Units by mouth daily.    [provider]  gabapentin (NEURONTIN) 100 MG capsule Take 1-3 capsules by mouth at bedtime for RLS 03/07/14   Lew Dawes, MD  guaiFENesin-codeine 100-10 MG/5ML syrup Take 10 mLs by mouth every 4 (  four) hours as needed for cough. 05/16/17   Dustin Flock, MD  lactobacillus acidophilus (BACID) TABS tablet Take 2 tablets by mouth 3 (three) times daily. 05/16/17   Dustin Flock, MD  pantoprazole (PROTONIX) 40 MG tablet Take 1 tablet (40 mg total) by mouth 2 (two) times daily. 05/21/16   Glean Hess, MD  phenol (CHLORASEPTIC) 1.4 % LIQD Use as directed 2 sprays in the mouth or throat as  needed for throat irritation / pain. 05/16/17   Dustin Flock, MD  vitamin B-12 (CYANOCOBALAMIN) 1000 MCG tablet Take 1,000 mcg by mouth daily.    [provider]    No Known Allergies  Past Surgical History:  Procedure Laterality Date  . ABDOMINAL HYSTERECTOMY    . APPENDECTOMY    . breast biopsy- benign    . BREAST CYST EXCISION Left   . bunion/morton's neuroma  4/07   right foot   . carpal tunnel release-right    . CHOLECYSTECTOMY  1994  . colitis transfusion     hosp at 25  . COLONOSCOPY     very difficul- supp with BE past  . CT HEAD (other)  6/10   small vessel chronic change/ no acute findings  . EGD tight ues sphincter  7/09   with dialtion  . FOOT SURGERY  10/09   TMT fusion and bunionectomy  . HEMORRHOID SURGERY    . hysterectomy (other)     age 49; ovarian cancer (1962)  . INCISIONAL HERNIA REPAIR     after ccy  . left knee surgery    . left shoulder replacement    . OOPHORECTOMY Bilateral 1962  . right shoulder surgery    . stress cardiolite  9/10  . VIDEO BRONCHOSCOPY  04/22/2012   Procedure: VIDEO BRONCHOSCOPY WITH FLUORO;  Surgeon: Juanito Doom, MD;  Location: [facility];  Service: Cardiopulmonary;  Laterality: Bilateral;    Social History   Tobacco Use  . Smoking status: Never Smoker  . Smokeless tobacco: Never Used  Substance Use Topics  . Alcohol use: Yes    Comment: social wine   . Drug use: No     Medication list has been reviewed and updated.  PHQ 2/9 Scores 06/04/2017 11/18/2016 05/21/2016 01/01/2016  PHQ - 2 Score 0 0 0 0  PHQ- 9 Score 0 0 - -    Physical Exam  Constitutional: She is oriented to person, place, and time. She appears well-developed. No distress.  HENT:  Head: Normocephalic and atraumatic.  Neck: Carotid bruit is not present.  Cardiovascular: Normal rate, regular rhythm and normal heart sounds.  Pulmonary/Chest: Effort normal. No respiratory distress. She has no wheezes. She has no rales.  Abdominal:  There is tenderness in the epigastric area. There is no rigidity, no guarding and no CVA tenderness.  Musculoskeletal: Normal range of motion.  Neurological: She is alert and oriented to person, place, and time.  Skin: Skin is warm and dry. No rash noted.  Psychiatric: She has a normal mood and affect. Her speech is normal and behavior is normal. Thought content normal.  Nursing note and vitals reviewed.   BP 138/76   Pulse 76   Ht 5\' 2"  (1.575 m)   Wt 155 lb 12.8 oz (70.7 kg)   SpO2 98%   BMI 28.50 kg/m   Assessment and Plan: 1. Acute renal failure, unspecified acute renal failure type ([place]) Labs from rehab show BUN and Cr normalized Continue adequate fluid intake  2. E. coli sepsis (  [place]) Repeat UCx today - POC urinalysis w microscopic (non auto)  3. Essential hypertension Controlled Continue bid zebeta  4. Anemia, unspecified type Check labs Give B12 today May be from gastritis/ulcer - stop Mobic Continue carafate - CBC with Differential/Platelet - Vitamin B12 - Iron - Ferritin - cyanocobalamin ((VITAMIN B-12)) injection 1,000 mcg  5. B12 deficiency - cyanocobalamin ((VITAMIN B-12)) injection 1,000 mcg  6. Urinary tract infection without hematuria, site unspecified May need repeat antibiotics May need referral to Urology   Meds ordered this encounter  Medications  . cyanocobalamin ((VITAMIN B-12)) injection 1,000 mcg    Partially dictated using Editor, commissioning. Any errors are unintentional.  Halina Maidens, MD [facility]  06/04/2017

## 2017-06-05 ENCOUNTER — Ambulatory Visit: Payer: PPO | Admitting: Internal Medicine

## 2017-06-05 ENCOUNTER — Encounter: Payer: Self-pay | Admitting: *Deleted

## 2017-06-05 LAB — CBC WITH DIFFERENTIAL/PLATELET
BASOS: 1 %
Basophils Absolute: 0.1 10*3/uL (ref 0.0–0.2)
EOS (ABSOLUTE): 0.3 10*3/uL (ref 0.0–0.4)
EOS: 4 %
HEMATOCRIT: 27.8 % — AB (ref 34.0–46.6)
HEMOGLOBIN: 8.8 g/dL — AB (ref 11.1–15.9)
IMMATURE GRANS (ABS): 0 10*3/uL (ref 0.0–0.1)
Immature Granulocytes: 0 %
LYMPHS: 31 %
Lymphocytes Absolute: 2.2 10*3/uL (ref 0.7–3.1)
MCH: 30.1 pg (ref 26.6–33.0)
MCHC: 31.7 g/dL (ref 31.5–35.7)
MCV: 95 fL (ref 79–97)
MONOCYTES: 10 %
Monocytes Absolute: 0.7 10*3/uL (ref 0.1–0.9)
NEUTROS ABS: 3.9 10*3/uL (ref 1.4–7.0)
Neutrophils: 54 %
Platelets: 410 10*3/uL — ABNORMAL HIGH (ref 150–379)
RBC: 2.92 x10E6/uL — ABNORMAL LOW (ref 3.77–5.28)
RDW: 14.7 % (ref 12.3–15.4)
WBC: 7.2 10*3/uL (ref 3.4–10.8)

## 2017-06-05 LAB — IRON: Iron: 42 ug/dL (ref 27–139)

## 2017-06-05 LAB — VITAMIN B12: Vitamin B-12: 1469 pg/mL — ABNORMAL HIGH (ref 232–1245)

## 2017-06-05 LAB — FERRITIN: FERRITIN: 74 ng/mL (ref 15–150)

## 2017-06-08 LAB — URINE CULTURE

## 2017-06-08 LAB — SPECIMEN STATUS REPORT

## 2017-06-09 ENCOUNTER — Other Ambulatory Visit: Payer: Self-pay | Admitting: *Deleted

## 2017-06-09 NOTE — Patient Outreach (Signed)
[facility]) Care Management  06/09/2017  Jodi Bartlett 18-Jul-1935 [id number]   Referral received from [facility] to follow patient while in rehab at [facility]. Patient has recently discharged from [facility] on 05/31/17. Phone call to patient to assess for community resource needs on patient's home and mobile number listed. There ws no answer on home phone. Patient answered [employer] call but hung up abruptly. Additional call made x2, call went straight to voicemail. Voicemail message left requesting a return call.  PLAN: Patient to be called again on 06/10/17.    Sheralyn Boatman [facility] Care Management [phone]

## 2017-06-10 ENCOUNTER — Other Ambulatory Visit: Payer: Self-pay | Admitting: *Deleted

## 2017-06-10 ENCOUNTER — Encounter: Payer: Self-pay | Admitting: *Deleted

## 2017-06-10 ENCOUNTER — Encounter: Payer: Self-pay | Admitting: Internal Medicine

## 2017-06-10 ENCOUNTER — Ambulatory Visit: Payer: Self-pay | Admitting: *Deleted

## 2017-06-10 NOTE — Patient Outreach (Signed)
[facility]) Care Management  06/10/2017  KEINA MUTCH 1935-11-19 [id number]  Second outreach call.  Referral received from [facility] to follow patient while in rehab at [facility]. Patient has recently discharged from [facility] on 05/31/17. Phone call to patient to assess for community resource needs on patient's home  number listed. There was no answer.  Voicemail message left requesting a return call.   Plan: This Education officer, museum will send a Economist.   Sheralyn Boatman [facility] Care Management [phone]

## 2017-06-11 ENCOUNTER — Telehealth: Payer: Self-pay

## 2017-06-11 ENCOUNTER — Other Ambulatory Visit: Payer: Self-pay

## 2017-06-11 DIAGNOSIS — N3000 Acute cystitis without hematuria: Secondary | ICD-10-CM

## 2017-06-11 MED ORDER — SULFAMETHOXAZOLE-TRIMETHOPRIM 800-160 MG PO TABS: 1.0000 | ORAL_TABLET | Freq: Two times a day (BID) | ORAL | 0 refills | Status: DC

## 2017-06-11 NOTE — Telephone Encounter (Signed)
Culture was sesnsitive to all meds- cefuroxime should have worked, but was switched over to septra ds BID x 7 days- sent to [facility]

## 2017-06-17 ENCOUNTER — Other Ambulatory Visit: Payer: Self-pay | Admitting: *Deleted

## 2017-06-17 NOTE — Patient Outreach (Signed)
[facility] Care Management  06/17/2017  [name] [birth year] [id number]  3rd attempt for screening call   Referral received 06/03/17 Referral source : Health team advantage Referral reason : Patient with discharge from inpatient admission from [facility] on 05/31/17.  Unsuccessful attempt to contact patient at 3rd attempt, placed calls to home and mobile numbers listed. I was able to leave a HIPAA compliant message on home phone, when contacted mobile number sounded as if someone picked up phone but hung up immediately.  No response from patient after outreach letter sent.    Plan Will notify CMA of case status, case closed, unable to contact patient.   Joylene Draft, RN, [place] Management Coordinator  [phone]- Mobile [phone]- Toll Free Main Office

## 2017-06-18 ENCOUNTER — Other Ambulatory Visit: Payer: Self-pay | Admitting: Internal Medicine

## 2017-06-18 MED ORDER — CIPROFLOXACIN HCL 250 MG PO TABS: 250.0000 mg | ORAL_TABLET | Freq: Two times a day (BID) | ORAL | 0 refills | Status: DC

## 2017-06-23 ENCOUNTER — Encounter: Payer: Self-pay | Admitting: *Deleted

## 2017-06-23 ENCOUNTER — Other Ambulatory Visit: Payer: Self-pay | Admitting: *Deleted

## 2017-06-23 ENCOUNTER — Ambulatory Visit: Payer: Self-pay | Admitting: *Deleted

## 2017-06-23 NOTE — Patient Outreach (Signed)
[facility]) Care Management  06/23/2017  Jodi Bartlett 08/07/1935 [id number]   Third outreach call to patient. HIPPA compliant message left requesting a return call. There has been no response from patient after outreach letter was sent.   Plan: Patient to be closed to [facility] care management at this time due to failure to reach.   Sheralyn Boatman [facility] Care Management [phone]

## 2017-06-25 ENCOUNTER — Encounter: Payer: Self-pay | Admitting: Internal Medicine

## 2017-06-25 ENCOUNTER — Ambulatory Visit (INDEPENDENT_AMBULATORY_CARE_PROVIDER_SITE_OTHER): Payer: PPO | Admitting: Internal Medicine

## 2017-06-25 VITALS — BP 128/78 | HR 82 | Ht 62.0 in | Wt 155.8 lb

## 2017-06-25 DIAGNOSIS — D508 Other iron deficiency anemias: Secondary | ICD-10-CM | POA: Diagnosis not present

## 2017-06-25 DIAGNOSIS — K219 Gastro-esophageal reflux disease without esophagitis: Secondary | ICD-10-CM

## 2017-06-25 DIAGNOSIS — I10 Essential (primary) hypertension: Secondary | ICD-10-CM

## 2017-06-25 DIAGNOSIS — N10 Acute tubulo-interstitial nephritis: Secondary | ICD-10-CM | POA: Diagnosis not present

## 2017-06-25 LAB — POCT URINALYSIS DIPSTICK
BILIRUBIN UA: NEGATIVE
GLUCOSE UA: NEGATIVE
Ketones, UA: NEGATIVE
Leukocytes, UA: NEGATIVE
Nitrite, UA: NEGATIVE
Protein, UA: NEGATIVE
RBC UA: 6.5
Spec Grav, UA: 1.015 (ref 1.010–1.025)
Urobilinogen, UA: 0.2 E.U./dL
pH, UA: 6.5 (ref 5.0–8.0)

## 2017-06-25 MED ORDER — BISOPROLOL FUMARATE 5 MG PO TABS: 2.5000 mg | ORAL_TABLET | Freq: Two times a day (BID) | ORAL | 3 refills | Status: DC

## 2017-06-25 MED ORDER — PANTOPRAZOLE SODIUM 40 MG PO TBEC: 40.0000 mg | DELAYED_RELEASE_TABLET | Freq: Two times a day (BID) | ORAL | 3 refills | Status: DC

## 2017-06-25 NOTE — Progress Notes (Signed)
Date:  06/25/2017   Name:  Jodi Bartlett   DOB:  1935-12-12   MRN:  [id number]   Chief Complaint: Urinary Tract Infection (F/up- recheck urine.) Urinary Tract Infection   The problem has been resolved. The patient is experiencing no pain. There has been no fever. Pertinent negatives include no chills, discharge, flank pain, frequency, hematuria, hesitancy, nausea or vomiting. She has tried antibiotics for the symptoms. The treatment provided significant relief.  Anemia  Presents for follow-up visit. There has been no fever or palpitations. Signs of blood loss that are not present include melena.  Hypertension  This is a chronic problem. The problem is controlled. Pertinent negatives include no chest pain, headaches, palpitations or shortness of breath. Past treatments include beta blockers (diuretics stopped).      Review of Systems  Constitutional: Negative for chills, fatigue and fever.  Respiratory: Negative for chest tightness and shortness of breath.   Cardiovascular: Negative for chest pain, palpitations and leg swelling.  Gastrointestinal: Negative for blood in stool, melena, nausea and vomiting.  Genitourinary: Negative for flank pain, frequency, hematuria and hesitancy.  Neurological: Negative for dizziness and headaches.    Patient Active Problem List   Diagnosis Date Noted  . Acute renal failure (ARF) ([place]) 05/13/2017  . Degenerative lumbar spinal stenosis 01/01/2016  . Primary osteoarthritis of both hips 01/01/2016  . Insomnia 07/17/2012  . Abnormality of lung on CXR 01/21/2012  . Anemia 11/13/2011  . DIVERTICULOSIS-COLON 05/29/2009  . Vitamin D deficiency 10/11/2008  . B12 deficiency 10/04/2008  . Bunion 10/19/2007  . IBS 10/01/2007  . Anxiety and depression 07/31/2007  . Essential hypertension 07/31/2007  . GERD 07/31/2007  . History of colonic polyps 07/31/2007    Prior to Admission medications   Medication Sig Start Date End Date Taking? Authorizing  Provider  acetaminophen (TYLENOL) 500 MG tablet Take 500 mg by mouth every 6 (six) hours as needed.   Yes [provider]  ALPRAZolam (XANAX) 0.25 MG tablet Take 1 tablet (0.25 mg total) by mouth at bedtime as needed. 05/16/17  Yes Dustin Flock, MD  amitriptyline (ELAVIL) 50 MG tablet Take 50 mg by mouth at bedtime.   Yes Lew Dawes, MD  bisoprolol (ZEBETA) 5 MG tablet TAKE ONE-HALF (1/2) TABLET BY MOUTH DAILY Patient taking differently: 2.5 mg 2 (two) times daily. TAKE ONE-HALF (1/2) TABLET BY MOUTH DAILY 05/21/16  Yes Glean Hess, MD  buPROPion [facility] SR) 150 MG 12 hr tablet Take 300 mg by mouth daily.    Yes Lew Dawes, MD  gabapentin (NEURONTIN) 100 MG capsule Take 1-3 capsules by mouth at bedtime for RLS 03/07/14  Yes Lew Dawes, MD  lactobacillus acidophilus (BACID) TABS tablet Take 2 tablets by mouth 3 (three) times daily. 05/16/17  Yes Dustin Flock, MD  pantoprazole (PROTONIX) 40 MG tablet Take 1 tablet (40 mg total) by mouth 2 (two) times daily. 05/21/16  Yes Glean Hess, MD  phenol (CHLORASEPTIC) 1.4 % LIQD Use as directed 2 sprays in the mouth or throat as needed for throat irritation / pain. 05/16/17  Yes Dustin Flock, MD  potassium chloride SA (K-DUR,KLOR-CON) 20 MEQ tablet Take 20 mEq by mouth 2 (two) times daily.   Yes [provider]  vitamin B-12 (CYANOCOBALAMIN) 1000 MCG tablet Take 1,000 mcg by mouth daily.   Yes [provider]    No Known Allergies  Past Surgical History:  Procedure Laterality Date  . ABDOMINAL HYSTERECTOMY    .  APPENDECTOMY    . breast biopsy- benign    . BREAST CYST EXCISION Left   . bunion/morton's neuroma  4/07   right foot   . carpal tunnel release-right    . CHOLECYSTECTOMY  1994  . colitis transfusion     hosp at 25  . COLONOSCOPY     very difficul- supp with BE past  . CT HEAD (other)  6/10   small vessel chronic change/ no acute findings  . EGD tight ues sphincter  7/09    with dialtion  . FOOT SURGERY  10/09   TMT fusion and bunionectomy  . HEMORRHOID SURGERY    . hysterectomy (other)     age 49; ovarian cancer (1962)  . INCISIONAL HERNIA REPAIR     after ccy  . left knee surgery    . left shoulder replacement    . OOPHORECTOMY Bilateral 1962  . right shoulder surgery    . stress cardiolite  9/10  . VIDEO BRONCHOSCOPY  04/22/2012   Procedure: VIDEO BRONCHOSCOPY WITH FLUORO;  Surgeon: Juanito Doom, MD;  Location: [facility];  Service: Cardiopulmonary;  Laterality: Bilateral;    Social History   Tobacco Use  . Smoking status: Never Smoker  . Smokeless tobacco: Never Used  Substance Use Topics  . Alcohol use: Yes    Comment: social wine   . Drug use: No     Medication list has been reviewed and updated.  PHQ 2/9 Scores 06/04/2017 11/18/2016 05/21/2016 01/01/2016  PHQ - 2 Score 0 0 0 0  PHQ- 9 Score 0 0 - -    Physical Exam  Constitutional: She is oriented to person, place, and time. She appears well-developed. No distress.  HENT:  Head: Normocephalic and atraumatic.  Neck: Normal range of motion. Neck supple.  Cardiovascular: Normal rate, regular rhythm and normal heart sounds.  Pulmonary/Chest: Effort normal and breath sounds normal. No respiratory distress. She has no wheezes.  Abdominal: Soft. There is no tenderness. There is no CVA tenderness.  Musculoskeletal: Normal range of motion. She exhibits no edema or tenderness.  Neurological: She is alert and oriented to person, place, and time.  Skin: Skin is warm and dry. No rash noted.  Psychiatric: She has a normal mood and affect. Her behavior is normal. Thought content normal.  Nursing note and vitals reviewed.  Urine dipstick shows negative for all components.  Micro exam: not done.  BP 128/78   Pulse 82   Ht 5\' 2"  (1.575 m)   Wt 155 lb 12.8 oz (70.7 kg)   SpO2 99%   BMI 28.50 kg/m   Assessment and Plan: 1. Acute pyelonephritis resolved - POCT urinalysis dipstick  2.  Essential hypertension controlled - bisoprolol (ZEBETA) 5 MG tablet; Take 0.5 tablets (2.5 mg total) by mouth 2 (two) times daily. TAKE ONE-HALF (1/2) TABLET BY MOUTH DAILY  Dispense: 90 tablet; Refill: 3  3. Other iron deficiency anemia Check CBC and advise on iron supplement - CBC with Differential/Platelet  4. Gastroesophageal reflux disease without esophagitis - pantoprazole (PROTONIX) 40 MG tablet; Take 1 tablet (40 mg total) by mouth 2 (two) times daily.  Dispense: 180 tablet; Refill: 3   Meds ordered this encounter  Medications  . bisoprolol (ZEBETA) 5 MG tablet    Sig: Take 0.5 tablets (2.5 mg total) by mouth 2 (two) times daily. TAKE ONE-HALF (1/2) TABLET BY MOUTH DAILY    Dispense:  90 tablet    Refill:  3  . pantoprazole (PROTONIX)  40 MG tablet    Sig: Take 1 tablet (40 mg total) by mouth 2 (two) times daily.    Dispense:  180 tablet    Refill:  3    Partially dictated using Editor, commissioning. Any errors are unintentional.  Halina Maidens, MD [facility]  06/25/2017

## 2017-06-26 LAB — CBC WITH DIFFERENTIAL/PLATELET
BASOS: 0 %
Basophils Absolute: 0 10*3/uL (ref 0.0–0.2)
EOS (ABSOLUTE): 0.1 10*3/uL (ref 0.0–0.4)
EOS: 2 %
HEMATOCRIT: 30.3 % — AB (ref 34.0–46.6)
Hemoglobin: 9.8 g/dL — ABNORMAL LOW (ref 11.1–15.9)
IMMATURE GRANS (ABS): 0 10*3/uL (ref 0.0–0.1)
IMMATURE GRANULOCYTES: 0 %
Lymphocytes Absolute: 2.5 10*3/uL (ref 0.7–3.1)
Lymphs: 35 %
MCH: 30.4 pg (ref 26.6–33.0)
MCHC: 32.3 g/dL (ref 31.5–35.7)
MCV: 94 fL (ref 79–97)
Monocytes Absolute: 0.4 10*3/uL (ref 0.1–0.9)
Monocytes: 6 %
NEUTROS PCT: 57 %
Neutrophils Absolute: 4.2 10*3/uL (ref 1.4–7.0)
Platelets: 329 10*3/uL (ref 150–379)
RBC: 3.22 x10E6/uL — ABNORMAL LOW (ref 3.77–5.28)
RDW: 15.4 % (ref 12.3–15.4)
WBC: 7.3 10*3/uL (ref 3.4–10.8)

## 2017-06-30 ENCOUNTER — Other Ambulatory Visit: Payer: Self-pay | Admitting: Internal Medicine

## 2017-06-30 DIAGNOSIS — I10 Essential (primary) hypertension: Secondary | ICD-10-CM

## 2017-08-05 ENCOUNTER — Ambulatory Visit (INDEPENDENT_AMBULATORY_CARE_PROVIDER_SITE_OTHER): Payer: PPO | Admitting: Internal Medicine

## 2017-08-05 ENCOUNTER — Telehealth: Payer: Self-pay

## 2017-08-05 ENCOUNTER — Encounter: Payer: Self-pay | Admitting: Internal Medicine

## 2017-08-05 VITALS — BP 156/84 | HR 76 | Resp 16 | Ht 62.0 in | Wt 129.0 lb

## 2017-08-05 DIAGNOSIS — I10 Essential (primary) hypertension: Secondary | ICD-10-CM

## 2017-08-05 DIAGNOSIS — R634 Abnormal weight loss: Secondary | ICD-10-CM

## 2017-08-05 DIAGNOSIS — J0100 Acute maxillary sinusitis, unspecified: Secondary | ICD-10-CM

## 2017-08-05 LAB — POC URINALYSIS WITH MICROSCOPIC (NON AUTO)MANUAL RESULT
Bacteria, UA: 0
Blood, UA: NEGATIVE
Crystals: 0
EPITHELIAL CELLS, URINE PER MICROSCOPY: 2
Glucose, UA: NEGATIVE
KETONES UA: NEGATIVE
Mucus, UA: 0
NITRITE UA: NEGATIVE
RBC: 0 M/uL — AB (ref 4.04–5.48)
Spec Grav, UA: 1.01 (ref 1.010–1.025)
Urobilinogen, UA: 0.2 E.U./dL
WBC Casts, UA: 3
pH, UA: 6.5 (ref 5.0–8.0)

## 2017-08-05 MED ORDER — CEFDINIR 300 MG PO CAPS: 300.0000 mg | ORAL_CAPSULE | Freq: Two times a day (BID) | ORAL | 0 refills | Status: DC

## 2017-08-05 NOTE — Patient Instructions (Signed)
Monitor weight several times per week  Add Ensure or other protein drink daily between meals  Continue current blood pressure medication for now

## 2017-08-05 NOTE — Progress Notes (Signed)
Date:  08/05/2017   Name:  Jodi Bartlett   DOB:  [birth year]   MRN:  [id number]   Chief Complaint: Hypertension (179/89 -190/97); Headache (5 days ); and Weight Loss (weight last ov is wrong she weighed at lab after walking out of ov and was 139 that day. )  Hypertension  This is a chronic (very high readings over the weeken) problem. Associated symptoms include headaches. Pertinent negatives include no chest pain, palpitations, peripheral edema or shortness of breath.  Headache   This is a new problem. Episode onset: several days ago. The problem has been gradually improving. Associated symptoms include coughing, sinus pressure and vomiting (once when headache was severe). Pertinent negatives include no dizziness, fever, sore throat or weakness. Her past medical history is significant for hypertension.  Cough  This is a new problem. The current episode started in the past 7 days. Associated symptoms include headaches and postnasal drip. Pertinent negatives include no chest pain, chills, ear congestion, fever, sore throat or shortness of breath. Associated symptoms comments: Sinus pressure and yellow discharge.     Review of Systems  Constitutional: Positive for unexpected weight change (6 lbs over past 2 weeks per home scales). Negative for chills, fatigue and fever.  HENT: Positive for congestion, dental problem (tooth ache), postnasal drip and sinus pressure. Negative for facial swelling, sore throat and trouble swallowing.   Eyes: Negative for visual disturbance.  Respiratory: Positive for cough. Negative for chest tightness and shortness of breath.   Cardiovascular: Negative for chest pain and palpitations.  Gastrointestinal: Positive for vomiting (once when headache was severe).  Neurological: Positive for headaches. Negative for dizziness, tremors, syncope and weakness.    Patient Active Problem List   Diagnosis Date Noted  . Degenerative lumbar spinal stenosis 01/01/2016  .  Primary osteoarthritis of both hips 01/01/2016  . Insomnia 07/17/2012  . Abnormality of lung on CXR 01/21/2012  . Anemia 11/13/2011  . DIVERTICULOSIS-COLON 05/29/2009  . Vitamin D deficiency 10/11/2008  . B12 deficiency 10/04/2008  . Bunion 10/19/2007  . IBS 10/01/2007  . Anxiety and depression 07/31/2007  . Essential hypertension 07/31/2007  . GERD 07/31/2007  . History of colonic polyps 07/31/2007    Prior to Admission medications   Medication Sig Start Date End Date Taking? Authorizing Provider  acetaminophen (TYLENOL) 500 MG tablet Take 500 mg by mouth every 6 (six) hours as needed.   Yes [provider]  amitriptyline (ELAVIL) 50 MG tablet Take 50 mg by mouth at bedtime.   Yes Lew Dawes, MD  bisoprolol (ZEBETA) 5 MG tablet Take 0.5 tablets (2.5 mg total) by mouth 2 (two) times daily. 06/30/17  Yes Glean Hess, MD  buPROPion [facility] SR) 150 MG 12 hr tablet Take 300 mg by mouth daily.    Yes Lew Dawes, MD  Cholecalciferol (VITAMIN D3) 2000 units TABS Take by mouth.   Yes [provider]  Ferrous Gluconate-C-Folic Acid (IRON-C PO) Take by mouth.   Yes [provider]  gabapentin (NEURONTIN) 100 MG capsule Take 1-3 capsules by mouth at bedtime for RLS 03/07/14  Yes Lew Dawes, MD  pantoprazole (PROTONIX) 40 MG tablet Take 1 tablet (40 mg total) by mouth 2 (two) times daily. 06/25/17  Yes Glean Hess, MD  vitamin B-12 (CYANOCOBALAMIN) 1000 MCG tablet Take 1,000 mcg by mouth daily.   Yes [provider]    No Known Allergies  Past Surgical History:  Procedure Laterality Date  .  ABDOMINAL HYSTERECTOMY    . APPENDECTOMY    . breast biopsy- benign    . BREAST CYST EXCISION Left   . bunion/morton's neuroma  4/07   right foot   . carpal tunnel release-right    . CHOLECYSTECTOMY  1994  . colitis transfusion     hosp at 25  . COLONOSCOPY     very difficul- supp with BE past  . CT HEAD (other)  6/10   small vessel  chronic change/ no acute findings  . EGD tight ues sphincter  7/09   with dialtion  . FOOT SURGERY  10/09   TMT fusion and bunionectomy  . HEMORRHOID SURGERY    . hysterectomy (other)     age 82; ovarian cancer (1962)  . INCISIONAL HERNIA REPAIR     after ccy  . left knee surgery    . left shoulder replacement    . OOPHORECTOMY Bilateral 1962  . right shoulder surgery    . stress cardiolite  9/10  . VIDEO BRONCHOSCOPY  04/22/2012   Procedure: VIDEO BRONCHOSCOPY WITH FLUORO;  Surgeon: Juanito Doom, MD;  Location: [facility];  Service: Cardiopulmonary;  Laterality: Bilateral;    Social History   Tobacco Use  . Smoking status: Never Smoker  . Smokeless tobacco: Never Used  Substance Use Topics  . Alcohol use: Yes    Comment: social wine   . Drug use: No     Medication list has been reviewed and updated.  PHQ 2/9 Scores 06/04/2017 11/18/2016 05/21/2016 01/01/2016  PHQ - 2 Score 0 0 0 0  PHQ- 9 Score 0 0 - -    Physical Exam  Constitutional: She is oriented to person, place, and time. She appears well-developed and well-nourished.  HENT:  Right Ear: External ear and ear canal normal. Tympanic membrane is not erythematous and not retracted.  Left Ear: External ear and ear canal normal. Tympanic membrane is not erythematous and not retracted.  Nose: Right sinus exhibits maxillary sinus tenderness. Right sinus exhibits no frontal sinus tenderness. Left sinus exhibits frontal sinus tenderness. Left sinus exhibits no maxillary sinus tenderness.  Mouth/Throat: Uvula is midline and mucous membranes are normal. No oral lesions. Posterior oropharyngeal erythema present. No oropharyngeal exudate.  Cardiovascular: Normal rate, regular rhythm and normal heart sounds.  Pulmonary/Chest: Breath sounds normal. She has no wheezes. She has no rales.  Abdominal: Soft. There is tenderness in the suprapubic area. There is no rigidity and no guarding.  Lymphadenopathy:    She has no cervical  adenopathy.  Neurological: She is alert and oriented to person, place, and time.    BP (!) 156/84   Pulse 76   Resp 16   Ht 5\' 2"  (1.575 m)   Wt 129 lb (58.5 kg)   SpO2 100%   BMI 23.59 kg/m   Assessment and Plan: 1. Acute non-recurrent maxillary sinusitis Continue tylenol for headache - cefdinir (OMNICEF) 300 MG capsule; Take 1 capsule (300 mg total) by mouth 2 (two) times daily.  Dispense: 20 capsule; Refill: 0  2. Essential hypertension Fair control - will not change regimen for now  3. Weight loss Continue to monitor Ensure supplement daily - POC urinalysis w microscopic (non auto)   Meds ordered this encounter  Medications  . cefdinir (OMNICEF) 300 MG capsule    Sig: Take 1 capsule (300 mg total) by mouth 2 (two) times daily.    Dispense:  20 capsule    Refill:  0    Partially  dictated using Editor, commissioning. Any errors are unintentional.  Halina Maidens, MD [facility]  08/05/2017

## 2017-08-05 NOTE — Telephone Encounter (Signed)
LM had some HA and High BP with Vomiting. Called and advised OV today and she does not want to come until tomorrow

## 2017-08-06 ENCOUNTER — Ambulatory Visit: Payer: PPO | Admitting: Internal Medicine

## 2017-08-15 ENCOUNTER — Telehealth: Payer: Self-pay

## 2017-08-15 NOTE — Telephone Encounter (Signed)
LM after I got VM. Explained on detailed message to drink plenty clear fluids and per Dr..Berglund ok to use Delsym and allow 10 days to let Abx work after finished. MUC if wheezing or fever develop or SOB.

## 2017-09-05 ENCOUNTER — Encounter: Payer: Self-pay | Admitting: Internal Medicine

## 2017-09-05 ENCOUNTER — Ambulatory Visit
Admission: RE | Admit: 2017-09-05 | Discharge: 2017-09-05 | Disposition: A | Discharge status: Home / Self Care | Payer: PPO | Source: Ambulatory Visit | Attending: Internal Medicine | Admitting: Internal Medicine

## 2017-09-05 ENCOUNTER — Ambulatory Visit (INDEPENDENT_AMBULATORY_CARE_PROVIDER_SITE_OTHER): Payer: PPO | Admitting: Internal Medicine

## 2017-09-05 VITALS — BP 130/78 | HR 79 | Ht 62.0 in | Wt 127.0 lb

## 2017-09-05 DIAGNOSIS — R05 Cough: Secondary | ICD-10-CM

## 2017-09-05 DIAGNOSIS — R059 Cough, unspecified: Secondary | ICD-10-CM

## 2017-09-05 DIAGNOSIS — D508 Other iron deficiency anemias: Secondary | ICD-10-CM | POA: Diagnosis not present

## 2017-09-05 DIAGNOSIS — Z6823 Body mass index (BMI) 23.0-23.9, adult: Secondary | ICD-10-CM | POA: Diagnosis not present

## 2017-09-05 DIAGNOSIS — I10 Essential (primary) hypertension: Secondary | ICD-10-CM | POA: Diagnosis not present

## 2017-09-05 NOTE — Progress Notes (Signed)
Date:  09/05/2017   Name:  Jodi Bartlett   DOB:  Jul 17, 1935   MRN:  [id number]   Chief Complaint: Weight Loss (3 months ) and Hypertension  Hypertension  This is a chronic problem. The problem is unchanged. The problem is controlled. Associated symptoms include malaise/fatigue. Pertinent negatives include no chest pain, palpitations or shortness of breath. Past treatments include beta blockers. The current treatment provides significant improvement.  Anemia  Presents for follow-up visit. Symptoms include malaise/fatigue and weight loss. There has been no abdominal pain, fever or palpitations. Signs of blood loss that are not present include hematemesis, hematochezia and vaginal bleeding. There are no compliance problems (taking iron supplement).    Weight loss - her weight has been essentially stable over the past month.  At home it varies from 126 - 129 lbs.  She feels like she is getting some strength back.  No change in mild constipation.  Appetite is fair and she is supplementing with Ensure and Frostys.   Review of Systems  Constitutional: Positive for malaise/fatigue and weight loss. Negative for chills, fatigue and fever.  Respiratory: Positive for cough (intermittent). Negative for chest tightness, shortness of breath and wheezing.   Cardiovascular: Negative for chest pain, palpitations and leg swelling.  Gastrointestinal: Positive for constipation. Negative for abdominal pain, hematemesis and hematochezia.  Genitourinary: Negative for vaginal bleeding.  Musculoskeletal: Negative for arthralgias and joint swelling.  Neurological: Negative for dizziness.  Psychiatric/Behavioral: Negative for dysphoric mood. The patient is not nervous/anxious.     Patient Active Problem List   Diagnosis Date Noted  . Degenerative lumbar spinal stenosis 01/01/2016  . Primary osteoarthritis of both hips 01/01/2016  . Insomnia 07/17/2012  . Abnormality of lung on CXR 01/21/2012  . Anemia  11/13/2011  . DIVERTICULOSIS-COLON 05/29/2009  . Vitamin D deficiency 10/11/2008  . B12 deficiency 10/04/2008  . Bunion 10/19/2007  . IBS 10/01/2007  . Anxiety and depression 07/31/2007  . Essential hypertension 07/31/2007  . GERD 07/31/2007  . History of colonic polyps 07/31/2007    Prior to Admission medications   Medication Sig Start Date End Date Taking? Authorizing Provider  acetaminophen (TYLENOL) 500 MG tablet Take 500 mg by mouth every 6 (six) hours as needed.   Yes [provider]  amitriptyline (ELAVIL) 50 MG tablet Take 50 mg by mouth at bedtime.   Yes Lew Dawes, MD  bisoprolol (ZEBETA) 5 MG tablet Take 0.5 tablets (2.5 mg total) by mouth 2 (two) times daily. 06/30/17  Yes Glean Hess, MD  buPROPion [facility] SR) 150 MG 12 hr tablet Take 300 mg by mouth daily.    Yes Lew Dawes, MD  cefdinir (OMNICEF) 300 MG capsule Take 1 capsule (300 mg total) by mouth 2 (two) times daily. 08/05/17  Yes Glean Hess, MD  Cholecalciferol (VITAMIN D3) 2000 units TABS Take by mouth.   Yes [provider]  Ferrous Gluconate-C-Folic Acid (IRON-C PO) Take by mouth.   Yes [provider]  gabapentin (NEURONTIN) 100 MG capsule Take 1-3 capsules by mouth at bedtime for RLS 03/07/14  Yes Lew Dawes, MD  pantoprazole (PROTONIX) 40 MG tablet Take 1 tablet (40 mg total) by mouth 2 (two) times daily. 06/25/17  Yes Glean Hess, MD  vitamin B-12 (CYANOCOBALAMIN) 1000 MCG tablet Take 1,000 mcg by mouth daily.   Yes [provider]    No Known Allergies  Past Surgical History:  Procedure Laterality Date  . ABDOMINAL HYSTERECTOMY    .  APPENDECTOMY    . breast biopsy- benign    . BREAST CYST EXCISION Left   . bunion/morton's neuroma  4/07   right foot   . carpal tunnel release-right    . CHOLECYSTECTOMY  1994  . colitis transfusion     hosp at 25  . COLONOSCOPY     very difficul- supp with BE past  . CT HEAD (other)  6/10   small  vessel chronic change/ no acute findings  . EGD tight ues sphincter  7/09   with dialtion  . FOOT SURGERY  10/09   TMT fusion and bunionectomy  . HEMORRHOID SURGERY    . hysterectomy (other)     age 27; ovarian cancer (1962)  . INCISIONAL HERNIA REPAIR     after ccy  . left knee surgery    . left shoulder replacement    . OOPHORECTOMY Bilateral 1962  . right shoulder surgery    . stress cardiolite  9/10  . VIDEO BRONCHOSCOPY  04/22/2012   Procedure: VIDEO BRONCHOSCOPY WITH FLUORO;  Surgeon: Juanito Doom, MD;  Location: [facility];  Service: Cardiopulmonary;  Laterality: Bilateral;    Social History   Tobacco Use  . Smoking status: Never Smoker  . Smokeless tobacco: Never Used  Substance Use Topics  . Alcohol use: Yes    Comment: social wine   . Drug use: No     Medication list has been reviewed and updated.  Current Meds  Medication Sig  . acetaminophen (TYLENOL) 500 MG tablet Take 500 mg by mouth every 6 (six) hours as needed.  Marland Kitchen amitriptyline (ELAVIL) 50 MG tablet Take 50 mg by mouth at bedtime.  . bisoprolol (ZEBETA) 5 MG tablet Take 0.5 tablets (2.5 mg total) by mouth 2 (two) times daily.  Marland Kitchen buPROPion (WELLBUTRIN SR) 150 MG 12 hr tablet Take 300 mg by mouth daily.   . cefdinir (OMNICEF) 300 MG capsule Take 1 capsule (300 mg total) by mouth 2 (two) times daily.  . Cholecalciferol (VITAMIN D3) 2000 units TABS Take by mouth.  . Ferrous Gluconate-C-Folic Acid (IRON-C PO) Take by mouth.  . gabapentin (NEURONTIN) 100 MG capsule Take 1-3 capsules by mouth at bedtime for RLS  . pantoprazole (PROTONIX) 40 MG tablet Take 1 tablet (40 mg total) by mouth 2 (two) times daily.  . vitamin B-12 (CYANOCOBALAMIN) 1000 MCG tablet Take 1,000 mcg by mouth daily.    PHQ 2/9 Scores 06/04/2017 11/18/2016 05/21/2016 01/01/2016  PHQ - 2 Score 0 0 0 0  PHQ- 9 Score 0 0 - -    Physical Exam  Constitutional: She is oriented to person, place, and time. She appears well-developed. No  distress.  HENT:  Head: Normocephalic and atraumatic.  Neck: Normal range of motion. Neck supple.  Cardiovascular: Normal rate, regular rhythm and normal heart sounds.  Pulmonary/Chest: Effort normal and breath sounds normal. No respiratory distress. She has no wheezes. She has no rales.  Abdominal: Soft. Bowel sounds are normal.  Musculoskeletal: Normal range of motion.  Neurological: She is alert and oriented to person, place, and time.  Skin: Skin is warm and dry. No rash noted.  Psychiatric: She has a normal mood and affect. Her behavior is normal. Thought content normal.  Nursing note and vitals reviewed.   BP 130/78   Pulse 79   Wt 127 lb (57.6 kg)   SpO2 97%   BMI 23.23 kg/m   Assessment and Plan: 1. Cough Will check CXR; may need CT if abnormal (  has had ? Mass/atelectasis in the past) - DG Chest 2 View; Future  2. Essential hypertension controlled  3. Other iron deficiency anemia Check CBC and advise on iron - CBC with Differential/Platelet  4. BMI 23.0-23.9, adult Weight has essentially stabilized Continue healthy diet, supplements and monitoring weights  No orders of the defined types were placed in this encounter.   Partially dictated using Editor, commissioning. Any errors are unintentional.  Halina Maidens, MD [facility]  09/05/2017   There are no diagnoses linked to this encounter.

## 2017-09-06 LAB — CBC WITH DIFFERENTIAL/PLATELET
BASOS ABS: 0 10*3/uL (ref 0.0–0.2)
Basos: 0 %
EOS (ABSOLUTE): 0.2 10*3/uL (ref 0.0–0.4)
Eos: 4 %
Hematocrit: 34 % (ref 34.0–46.6)
Hemoglobin: 11.2 g/dL (ref 11.1–15.9)
IMMATURE GRANULOCYTES: 0 %
Immature Grans (Abs): 0 10*3/uL (ref 0.0–0.1)
LYMPHS ABS: 2.2 10*3/uL (ref 0.7–3.1)
Lymphs: 38 %
MCH: 30.6 pg (ref 26.6–33.0)
MCHC: 32.9 g/dL (ref 31.5–35.7)
MCV: 93 fL (ref 79–97)
MONOS ABS: 0.3 10*3/uL (ref 0.1–0.9)
Monocytes: 5 %
NEUTROS PCT: 53 %
Neutrophils Absolute: 3 10*3/uL (ref 1.4–7.0)
PLATELETS: 257 10*3/uL (ref 150–450)
RBC: 3.66 x10E6/uL — AB (ref 3.77–5.28)
RDW: 14.4 % (ref 12.3–15.4)
WBC: 5.8 10*3/uL (ref 3.4–10.8)

## 2017-09-17 DIAGNOSIS — F331 Major depressive disorder, recurrent, moderate: Secondary | ICD-10-CM | POA: Diagnosis not present

## 2017-10-31 ENCOUNTER — Telehealth: Payer: Self-pay

## 2017-10-31 NOTE — Telephone Encounter (Signed)
Called pt to sched AWV w/ [facility]. Unable to LVM d/t no answer.

## 2017-12-16 ENCOUNTER — Telehealth: Payer: Self-pay | Admitting: Internal Medicine

## 2017-12-16 NOTE — Telephone Encounter (Signed)
Called and spoke with patient. She said she has been stung 8-9 times and does have pain and swelling. Told her she can try OTC Zantac or Benadryl and use tylenol as needed. Told if SOB or throat/face swelling happens to see ER.

## 2017-12-16 NOTE — Telephone Encounter (Signed)
Spoke with pt to sched AWV  Pt stated that she was stung several times this morning around 11am by yellow jackets that came through her wall.  She said that her right hand is swollen as well as midway between wrist and elbow and would like to have some advice about how to treat the pain and swelling. Pt would like a c/b at her home at [phone]  Thank you, knb

## 2017-12-22 ENCOUNTER — Ambulatory Visit: Payer: PPO

## 2017-12-29 ENCOUNTER — Telehealth: Payer: Self-pay

## 2017-12-29 NOTE — Telephone Encounter (Signed)
Patient called stating she had a Maw visit scheduled last week but had to call and cancel because she had a death in the family. Wanted to call and reschedule that Maw for as soon as possible. Physical with Dr B is on 10/2.  Please advise and call patient.

## 2017-12-31 ENCOUNTER — Encounter: Payer: Self-pay | Admitting: Internal Medicine

## 2017-12-31 ENCOUNTER — Ambulatory Visit (INDEPENDENT_AMBULATORY_CARE_PROVIDER_SITE_OTHER): Payer: PPO | Admitting: Internal Medicine

## 2017-12-31 VITALS — BP 140/72 | HR 75 | Ht 62.0 in | Wt 126.0 lb

## 2017-12-31 DIAGNOSIS — E559 Vitamin D deficiency, unspecified: Secondary | ICD-10-CM | POA: Diagnosis not present

## 2017-12-31 DIAGNOSIS — I10 Essential (primary) hypertension: Secondary | ICD-10-CM | POA: Diagnosis not present

## 2017-12-31 DIAGNOSIS — Z0000 Encounter for general adult medical examination without abnormal findings: Secondary | ICD-10-CM | POA: Diagnosis not present

## 2017-12-31 DIAGNOSIS — K219 Gastro-esophageal reflux disease without esophagitis: Secondary | ICD-10-CM | POA: Insufficient documentation

## 2017-12-31 DIAGNOSIS — Z23 Encounter for immunization: Secondary | ICD-10-CM | POA: Diagnosis not present

## 2017-12-31 DIAGNOSIS — E2839 Other primary ovarian failure: Secondary | ICD-10-CM | POA: Diagnosis not present

## 2017-12-31 DIAGNOSIS — Z1231 Encounter for screening mammogram for malignant neoplasm of breast: Secondary | ICD-10-CM | POA: Diagnosis not present

## 2017-12-31 LAB — POCT URINALYSIS DIPSTICK
Bilirubin, UA: NEGATIVE
Blood, UA: NEGATIVE
Glucose, UA: NEGATIVE
Ketones, UA: NEGATIVE
LEUKOCYTES UA: NEGATIVE
NITRITE UA: NEGATIVE
PROTEIN UA: NEGATIVE
SPEC GRAV UA: 1.015 (ref 1.010–1.025)
Urobilinogen, UA: 0.2 E.U./dL
pH, UA: 6 (ref 5.0–8.0)

## 2017-12-31 NOTE — Progress Notes (Signed)
Date:  12/31/2017   Name:  Jodi Bartlett   DOB:  11-21-1935   MRN:  [id number]   Chief Complaint: Annual Exam (Breast Exam.) Jodi Bartlett is a 82 y.o. female who presents today for her Complete Annual Exam. She feels fairly well. She reports exercising keeping grandchild, yard and house work. She reports she is sleeping fairly well. No breast issues.  Due for mammogram and DEXA.  She is eating well, weight is essentially stable - down a pound today.  Hypertension  Pertinent negatives include no chest pain, headaches, palpitations or shortness of breath.  Gastroesophageal Reflux  She reports no abdominal pain, no chest pain, no coughing or no wheezing. Pertinent negatives include no fatigue.  Depression         This is a chronic problem.The problem is unchanged.  Associated symptoms include no fatigue and no headaches.  Past treatments include SSRIs - Selective serotonin reuptake inhibitors and other medications.  Compliance with treatment is good (followed by Psychiatry).   Review of Systems  Constitutional: Negative for chills, fatigue and fever.  HENT: Positive for trouble swallowing (mild dysphagia intermittently). Negative for congestion, hearing loss, tinnitus and voice change.   Eyes: Negative for visual disturbance.  Respiratory: Negative for cough, chest tightness, shortness of breath and wheezing.   Cardiovascular: Negative for chest pain, palpitations and leg swelling.  Gastrointestinal: Negative for abdominal pain, constipation, diarrhea and vomiting.  Endocrine: Negative for polydipsia and polyuria.  Genitourinary: Negative for dysuria, frequency, genital sores, vaginal bleeding and vaginal discharge.  Musculoskeletal: Negative for arthralgias, gait problem and joint swelling.  Skin: Negative for color change and rash.  Neurological: Negative for dizziness, tremors, light-headedness and headaches.  Hematological: Negative for adenopathy. Does not bruise/bleed easily.    Psychiatric/Behavioral: Positive for depression. Negative for dysphoric mood and sleep disturbance. The patient is not nervous/anxious.     Patient Active Problem List   Diagnosis Date Noted  . Gastroesophageal reflux disease without esophagitis 12/31/2017  . Degenerative lumbar spinal stenosis 01/01/2016  . Primary osteoarthritis of both hips 01/01/2016  . Insomnia 07/17/2012  . Anemia 11/13/2011  . DIVERTICULOSIS-COLON 05/29/2009  . Vitamin D deficiency 10/11/2008  . B12 deficiency 10/04/2008  . IBS 10/01/2007  . Anxiety and depression 07/31/2007  . Essential hypertension 07/31/2007  . History of colonic polyps 07/31/2007    No Known Allergies  Past Surgical History:  Procedure Laterality Date  . ABDOMINAL HYSTERECTOMY  1962   ovarian cancer  . APPENDECTOMY    . breast biopsy- benign    . BREAST CYST EXCISION Left   . bunion/morton's neuroma  4/07   right foot   . carpal tunnel release-right    . CHOLECYSTECTOMY  1994  . colitis transfusion     hosp at 25  . COLONOSCOPY     very difficul- supp with BE past  . EGD tight ues sphincter  7/09   with dialtion  . FOOT SURGERY  10/09   TMT fusion and bunionectomy  . HEMORRHOID SURGERY    . INCISIONAL HERNIA REPAIR     after ccy  . left knee surgery    . left shoulder replacement    . stress cardiolite  9/10  . VIDEO BRONCHOSCOPY  04/22/2012   Procedure: VIDEO BRONCHOSCOPY WITH FLUORO;  Surgeon: Juanito Doom, MD;  Location: [facility];  Service: Cardiopulmonary;  Laterality: Bilateral;    Social History   Tobacco Use  . Smoking status: Never Smoker  . Smokeless  tobacco: Never Used  . Tobacco comment: smoking cessation materials not required  Substance Use Topics  . Alcohol use: Yes    Comment: social wine   . Drug use: No     Medication list has been reviewed and updated.  Current Meds  Medication Sig  . acetaminophen (TYLENOL) 500 MG tablet Take 500 mg by mouth every 6 (six) hours as needed.  .  ALPRAZolam (XANAX) 0.25 MG tablet Take 0.25 mg by mouth at bedtime as needed for anxiety.  Marland Kitchen amitriptyline (ELAVIL) 50 MG tablet Take 50 mg by mouth at bedtime.  . bisoprolol (ZEBETA) 5 MG tablet Take 0.5 tablets (2.5 mg total) by mouth 2 (two) times daily.  Marland Kitchen buPROPion (WELLBUTRIN SR) 150 MG 12 hr tablet Take 300 mg by mouth daily.   . Cholecalciferol (VITAMIN D3) 2000 units TABS Take by mouth.  . Ferrous Gluconate-C-Folic Acid (IRON-C PO) Take by mouth.  . gabapentin (NEURONTIN) 100 MG capsule Take 200 mg by mouth daily. Take 1-3 capsules by mouth at bedtime for RLS   . pantoprazole (PROTONIX) 40 MG tablet Take 1 tablet (40 mg total) by mouth 2 (two) times daily.  . vitamin B-12 (CYANOCOBALAMIN) 1000 MCG tablet Take 1,000 mcg by mouth daily.    PHQ 2/9 Scores 12/31/2017 06/04/2017 11/18/2016 05/21/2016  PHQ - 2 Score 0 0 0 0  PHQ- 9 Score 0 0 0 -    Physical Exam  Constitutional: She is oriented to person, place, and time. She appears well-developed and well-nourished. No distress.  HENT:  Head: Normocephalic and atraumatic.  Right Ear: Tympanic membrane and ear canal normal.  Left Ear: Tympanic membrane and ear canal normal.  Nose: Right sinus exhibits no maxillary sinus tenderness. Left sinus exhibits no maxillary sinus tenderness.  Mouth/Throat: Uvula is midline and oropharynx is clear and moist.  Eyes: Conjunctivae and EOM are normal. Right eye exhibits no discharge. Left eye exhibits no discharge. No scleral icterus.  Neck: Normal range of motion. Carotid bruit is not present. No erythema present. No thyromegaly present.  Cardiovascular: Normal rate, regular rhythm, normal heart sounds and normal pulses.  Pulmonary/Chest: Effort normal. No respiratory distress. She has no wheezes. Right breast exhibits no mass, no nipple discharge, no skin change and no tenderness. Left breast exhibits no mass, no nipple discharge, no skin change and no tenderness.  Abdominal: Soft. Bowel sounds are  normal. There is no hepatosplenomegaly. There is no tenderness. There is no CVA tenderness.  Musculoskeletal: Normal range of motion.  Lymphadenopathy:    She has no cervical adenopathy.    She has no axillary adenopathy.  Neurological: She is alert and oriented to person, place, and time. She has normal reflexes. No cranial nerve deficit or sensory deficit.  Skin: Skin is warm, dry and intact. No rash noted.  Psychiatric: She has a normal mood and affect. Her speech is normal and behavior is normal. Thought content normal.  Nursing note and vitals reviewed.   BP (!) 142/72 (BP Location: Right Arm, Patient Position: Sitting, Cuff Size: Normal)   Pulse 75   Ht 5\' 2"  (1.575 m)   Wt 126 lb (57.2 kg)   SpO2 98%   BMI 23.05 kg/m   Assessment and Plan: 1. Annual physical exam Recommend walking for exercise Increase protein and healthy fats in diet - Lipid panel - POCT urinalysis dipstick  2. Essential hypertension controlled - Comprehensive metabolic panel - TSH  3. Gastroesophageal reflux disease without esophagitis Now off iron supplement Continue  PPI - CBC with Differential/Platelet  4. Encounter for screening mammogram for breast cancer - MM 3D SCREEN BREAST BILATERAL; Future  5. Ovarian failure Check vitamin D - HM DEXA SCAN  6. Need for influenza vaccination - Flu vaccine HIGH DOSE PF  7. Vitamin D deficiency DEXA planned - VITAMIN D 25 Hydroxy (Vit-D Deficiency, Fractures)   Partially dictated using Editor, commissioning. Any errors are unintentional.  Halina Maidens, MD [facility]  12/31/2017

## 2017-12-31 NOTE — Patient Instructions (Signed)
Health Maintenance  Topic Date Due  . DEXA SCAN  01/13/2003 - to be scheduled  . MAMMOGRAM  01/29/2017 - to be scheduled  . TETANUS/TDAP  11/17/2017  . INFLUENZA VACCINE  Done today  . PNA vac Low Risk Adult  Completed

## 2018-01-01 LAB — CBC WITH DIFFERENTIAL/PLATELET
BASOS ABS: 0 10*3/uL (ref 0.0–0.2)
Basos: 1 %
EOS (ABSOLUTE): 0.2 10*3/uL (ref 0.0–0.4)
Eos: 4 %
HEMOGLOBIN: 11 g/dL — AB (ref 11.1–15.9)
Hematocrit: 32.9 % — ABNORMAL LOW (ref 34.0–46.6)
IMMATURE GRANS (ABS): 0 10*3/uL (ref 0.0–0.1)
Immature Granulocytes: 0 %
LYMPHS: 30 %
Lymphocytes Absolute: 1.7 10*3/uL (ref 0.7–3.1)
MCH: 31.9 pg (ref 26.6–33.0)
MCHC: 33.4 g/dL (ref 31.5–35.7)
MCV: 95 fL (ref 79–97)
MONOCYTES: 8 %
Monocytes Absolute: 0.4 10*3/uL (ref 0.1–0.9)
NEUTROS ABS: 3.4 10*3/uL (ref 1.4–7.0)
NEUTROS PCT: 57 %
Platelets: 256 10*3/uL (ref 150–450)
RBC: 3.45 x10E6/uL — AB (ref 3.77–5.28)
RDW: 12.3 % (ref 12.3–15.4)
WBC: 5.8 10*3/uL (ref 3.4–10.8)

## 2018-01-01 LAB — VITAMIN D 25 HYDROXY (VIT D DEFICIENCY, FRACTURES): Vit D, 25-Hydroxy: 49.3 ng/mL (ref 30.0–100.0)

## 2018-01-01 LAB — COMPREHENSIVE METABOLIC PANEL
ALK PHOS: 81 IU/L (ref 39–117)
ALT: 20 IU/L (ref 0–32)
AST: 21 IU/L (ref 0–40)
Albumin/Globulin Ratio: 2 (ref 1.2–2.2)
Albumin: 4.4 g/dL (ref 3.5–4.8)
BILIRUBIN TOTAL: 0.4 mg/dL (ref 0.0–1.2)
BUN/Creatinine Ratio: 17 (ref 12–28)
BUN: 18 mg/dL (ref 8–27)
CHLORIDE: 103 mmol/L (ref 96–106)
CO2: 26 mmol/L (ref 20–29)
Calcium: 9.8 mg/dL (ref 8.7–10.3)
Creatinine, Ser: 1.08 mg/dL — ABNORMAL HIGH (ref 0.57–1.00)
GFR calc non Af Amer: 49 mL/min/{1.73_m2} — ABNORMAL LOW (ref >59)
GFR, EST AFRICAN AMERICAN: 56 mL/min/{1.73_m2} — AB (ref >59)
GLUCOSE: 89 mg/dL (ref 65–99)
Globulin, Total: 2.2 g/dL (ref 1.5–4.5)
Potassium: 4.5 mmol/L (ref 3.5–5.2)
Sodium: 144 mmol/L (ref 134–144)
TOTAL PROTEIN: 6.6 g/dL (ref 6.0–8.5)

## 2018-01-01 LAB — LIPID PANEL
CHOLESTEROL TOTAL: 161 mg/dL (ref 100–199)
Chol/HDL Ratio: 2.8 ratio (ref 0.0–4.4)
HDL: 58 mg/dL (ref >39)
LDL CALC: 90 mg/dL (ref 0–99)
Triglycerides: 66 mg/dL (ref 0–149)
VLDL CHOLESTEROL CAL: 13 mg/dL (ref 5–40)

## 2018-01-01 LAB — TSH: TSH: 4.23 u[IU]/mL (ref 0.450–4.500)

## 2018-01-09 DIAGNOSIS — F329 Major depressive disorder, single episode, unspecified: Secondary | ICD-10-CM | POA: Diagnosis not present

## 2018-02-02 ENCOUNTER — Telehealth: Payer: Self-pay

## 2018-02-02 NOTE — Telephone Encounter (Signed)
Patient called stating she has not hear about labs from out office at last appt.   Informed her I tried calling about a month ago and left VM but did not receive call back. I apologized to patient informed her of her labs. Told her to stay on same medications.  She said she has had a cough for about a month now and not getting any better. Told her I think she should come in and have her cough evaluated.  She said she will call back and schedule appt for this.

## 2018-02-12 ENCOUNTER — Encounter: Payer: Self-pay | Admitting: Internal Medicine

## 2018-02-12 ENCOUNTER — Ambulatory Visit
Admission: RE | Admit: 2018-02-12 | Discharge: 2018-02-12 | Disposition: A | Discharge status: Home / Self Care | Payer: PPO | Source: Ambulatory Visit | Attending: Internal Medicine | Admitting: Internal Medicine

## 2018-02-12 ENCOUNTER — Ambulatory Visit (INDEPENDENT_AMBULATORY_CARE_PROVIDER_SITE_OTHER): Payer: PPO | Admitting: Internal Medicine

## 2018-02-12 VITALS — BP 124/70 | HR 79 | Temp 97.5°F | Ht 62.0 in | Wt 122.0 lb

## 2018-02-12 DIAGNOSIS — I10 Essential (primary) hypertension: Secondary | ICD-10-CM | POA: Diagnosis not present

## 2018-02-12 DIAGNOSIS — J189 Pneumonia, unspecified organism: Secondary | ICD-10-CM | POA: Insufficient documentation

## 2018-02-12 DIAGNOSIS — J40 Bronchitis, not specified as acute or chronic: Secondary | ICD-10-CM

## 2018-02-12 DIAGNOSIS — R05 Cough: Secondary | ICD-10-CM | POA: Insufficient documentation

## 2018-02-12 DIAGNOSIS — J440 Chronic obstructive pulmonary disease with acute lower respiratory infection: Secondary | ICD-10-CM | POA: Diagnosis not present

## 2018-02-12 DIAGNOSIS — R062 Wheezing: Secondary | ICD-10-CM | POA: Diagnosis present

## 2018-02-12 MED ORDER — DOXYCYCLINE HYCLATE 100 MG PO TABS: 100.0000 mg | ORAL_TABLET | Freq: Two times a day (BID) | ORAL | 0 refills | Status: DC

## 2018-02-12 MED ORDER — BENZONATATE 100 MG PO CAPS: 100.0000 mg | ORAL_CAPSULE | Freq: Three times a day (TID) | ORAL | 0 refills | Status: DC

## 2018-02-12 NOTE — Progress Notes (Signed)
Date:  02/12/2018   Name:  Jodi Bartlett   DOB:  [birth year]   MRN:  [id number]   Chief Complaint: Cough (Green yellow thick production. X 4 weeks with cough. No headache. No fever known.)  Cough  This is a new problem. The current episode started more than 1 month ago. The problem has been unchanged. The problem occurs every few minutes. The cough is productive of sputum. Associated symptoms include chills and wheezing. Pertinent negatives include no chest pain, ear pain, fever, postnasal drip, sore throat or shortness of breath. The symptoms are aggravated by cold air and exercise. Risk factors for lung disease include occupational exposure. She has tried nothing for the symptoms. There is no history of environmental allergies.  She has had several serious chest infections over the years.  She was treated with Doxy in June with resolution.  Now these symptoms have been present for 5 weeks. She has never used an inhaler, never smoked but has had lots of second hand smoke, also worked as a Emergency planning/management officer for years.  Review of Systems  Constitutional: Positive for chills. Negative for fever.  HENT: Negative for ear pain, postnasal drip and sore throat.   Respiratory: Positive for cough, chest tightness and wheezing. Negative for shortness of breath.   Cardiovascular: Negative for chest pain and palpitations.  Allergic/Immunologic: Negative for environmental allergies.    Patient Active Problem List   Diagnosis Date Noted  . Gastroesophageal reflux disease without esophagitis 12/31/2017  . Degenerative lumbar spinal stenosis 01/01/2016  . Primary osteoarthritis of both hips 01/01/2016  . Insomnia 07/17/2012  . Anemia 11/13/2011  . DIVERTICULOSIS-COLON 05/29/2009  . Vitamin D deficiency 10/11/2008  . B12 deficiency 10/04/2008  . IBS 10/01/2007  . Anxiety and depression 07/31/2007  . Essential hypertension 07/31/2007  . History of colonic polyps 07/31/2007    No Known  Allergies  Past Surgical History:  Procedure Laterality Date  . ABDOMINAL HYSTERECTOMY  1962   ovarian cancer  . APPENDECTOMY    . breast biopsy- benign    . BREAST CYST EXCISION Left   . bunion/morton's neuroma  4/07   right foot   . carpal tunnel release-right    . CHOLECYSTECTOMY  1994  . colitis transfusion     hosp at 25  . COLONOSCOPY     very difficul- supp with BE past  . EGD tight ues sphincter  7/09   with dialtion  . FOOT SURGERY  10/09   TMT fusion and bunionectomy  . HEMORRHOID SURGERY    . INCISIONAL HERNIA REPAIR     after ccy  . left knee surgery    . left shoulder replacement    . stress cardiolite  9/10  . VIDEO BRONCHOSCOPY  04/22/2012   Procedure: VIDEO BRONCHOSCOPY WITH FLUORO;  Surgeon: Juanito Doom, MD;  Location: [facility];  Service: Cardiopulmonary;  Laterality: Bilateral;    Social History   Tobacco Use  . Smoking status: Never Smoker  . Smokeless tobacco: Never Used  . Tobacco comment: smoking cessation materials not required  Substance Use Topics  . Alcohol use: Yes    Comment: social wine   . Drug use: No     Medication list has been reviewed and updated.  Current Meds  Medication Sig  . acetaminophen (TYLENOL) 500 MG tablet Take 500 mg by mouth every 6 (six) hours as needed.  . ALPRAZolam (XANAX) 0.25 MG tablet Take 0.25 mg by mouth at bedtime as needed  for anxiety.  Marland Kitchen amitriptyline (ELAVIL) 50 MG tablet Take 50 mg by mouth at bedtime.  . bisoprolol (ZEBETA) 5 MG tablet Take 0.5 tablets (2.5 mg total) by mouth 2 (two) times daily.  Marland Kitchen buPROPion (WELLBUTRIN SR) 150 MG 12 hr tablet Take 300 mg by mouth daily.   . Cholecalciferol (VITAMIN D3) 2000 units TABS Take by mouth.  . Ferrous Gluconate-C-Folic Acid (IRON-C PO) Take by mouth.  . gabapentin (NEURONTIN) 100 MG capsule Take 200 mg by mouth daily. Take 1-3 capsules by mouth at bedtime for RLS   . pantoprazole (PROTONIX) 40 MG tablet Take 1 tablet (40 mg total) by mouth 2  (two) times daily.  . vitamin B-12 (CYANOCOBALAMIN) 1000 MCG tablet Take 1,000 mcg by mouth daily.    PHQ 2/9 Scores 12/31/2017 12/31/2017 06/04/2017 11/18/2016  PHQ - 2 Score 0 0 0 0  PHQ- 9 Score 0 0 0 0    Physical Exam  Constitutional: She is oriented to person, place, and time. She appears well-developed. No distress.  HENT:  Head: Normocephalic and atraumatic.  Cardiovascular: Normal rate, regular rhythm and normal heart sounds.  Pulmonary/Chest: Effort normal. No respiratory distress. She has decreased breath sounds. She has wheezes. She has no rhonchi. She has no rales.  Musculoskeletal: Normal range of motion.  Neurological: She is alert and oriented to person, place, and time.  Skin: Skin is warm and dry. No rash noted.  Psychiatric: She has a normal mood and affect. Her behavior is normal. Thought content normal.  Nursing note and vitals reviewed.   BP 124/70 (BP Location: Right Arm, Patient Position: Sitting, Cuff Size: Normal)   Pulse 79   Temp (!) 97.5 F (36.4 C) (Oral)   Ht 5\' 2"  (1.575 m)   Wt 122 lb (55.3 kg)   SpO2 97%   BMI 22.31 kg/m   Assessment and Plan: 1. Bronchitis Recurrent - if sx do not resolve or recur then will need Pulmonary ref Sample of Breo - use once a day - DG Chest 2 View; Future - doxycycline (VIBRA-TABS) 100 MG tablet; Take 1 tablet (100 mg total) by mouth 2 (two) times daily.  Dispense: 20 tablet; Refill: 0 - benzonatate (TESSALON) 100 MG capsule; Take 1 capsule (100 mg total) by mouth 3 (three) times daily.  Dispense: 20 capsule; Refill: 0  2. Essential hypertension controlled   Partially dictated using Editor, commissioning. Any errors are unintentional.  Halina Maidens, MD [facility]  02/12/2018

## 2018-02-25 ENCOUNTER — Other Ambulatory Visit: Payer: Self-pay | Admitting: Internal Medicine

## 2018-02-25 ENCOUNTER — Telehealth: Payer: Self-pay

## 2018-02-25 DIAGNOSIS — R9389 Abnormal findings on diagnostic imaging of other specified body structures: Secondary | ICD-10-CM

## 2018-02-25 DIAGNOSIS — J40 Bronchitis, not specified as acute or chronic: Secondary | ICD-10-CM

## 2018-02-25 MED ORDER — AZITHROMYCIN 250 MG PO TABS: ORAL_TABLET | ORAL | 0 refills | Status: AC

## 2018-02-25 NOTE — Telephone Encounter (Signed)
Patient called stating she was told to call you if she gets no better. She said she is still coughing and its not getting better.   Please Advise.

## 2018-02-25 NOTE — Telephone Encounter (Signed)
Patient informed. Will call if cannot not get appt in 2 weeks.

## 2018-02-25 NOTE — Telephone Encounter (Signed)
I have sent another antibiotic to [facility] in [place].  I also made a referral to Pulmonary.  She needs to make an appointment to see me in 2 weeks for follow up and CXR unless she can get in to the Pulmonary specialist by then.

## 2018-03-18 ENCOUNTER — Ambulatory Visit (INDEPENDENT_AMBULATORY_CARE_PROVIDER_SITE_OTHER): Payer: PPO | Admitting: Pulmonary Disease

## 2018-03-18 ENCOUNTER — Encounter: Payer: Self-pay | Admitting: Pulmonary Disease

## 2018-03-18 VITALS — BP 154/86 | HR 76 | Ht 62.0 in | Wt 122.2 lb

## 2018-03-18 DIAGNOSIS — R059 Cough, unspecified: Secondary | ICD-10-CM

## 2018-03-18 DIAGNOSIS — J181 Lobar pneumonia, unspecified organism: Secondary | ICD-10-CM

## 2018-03-18 DIAGNOSIS — J4531 Mild persistent asthma with (acute) exacerbation: Secondary | ICD-10-CM

## 2018-03-18 DIAGNOSIS — R05 Cough: Secondary | ICD-10-CM

## 2018-03-18 MED ORDER — BUDESONIDE-FORMOTEROL FUMARATE 160-4.5 MCG/ACT IN AERO: 2.0000 | INHALATION_SPRAY | Freq: Two times a day (BID) | RESPIRATORY_TRACT | 0 refills | Status: DC

## 2018-03-18 MED ORDER — AMBULATORY NON FORMULARY MEDICATION: 0 refills | Status: DC

## 2018-03-18 NOTE — Progress Notes (Signed)
Subjective:    Patient ID: Jodi Bartlett, female    DOB: 26-Oct-1935, 82 y.o.   MRN: [id number]  HPI Patient is an 82 year old lifelong never smoker who presents for evaluation of a cough that has been present since around October.  She is kindly referred by Dr. Halina Maidens.  He has been treated with 2 rounds of antibiotics the last of which was an azithromycin Z-Pak.  A chest radiograph performed on 14 November showed a patch of pneumonia on the right lower lobe.  The chest x-ray was also read as consistent with COPD.  This may be related to chronic air trapping due to asthma and/or senescent changes.  A CT scan performed in 2014 showed no evidence of emphysema or COPD but some chronic scarring changes.  She has been previously evaluated for abnormal CT scan by Dr. Blossom Hoops in 2014.  This had been related to pneumonia which eventually cleared.  Patient has had no fevers, chills or sweats.  She has had issues with breakthrough symptoms of gastroesophageal reflux that she notes despite being on Protonix twice a day.  Review of her CT scan performed in 2014 also shows that she had a hiatal hernia which may be aggravating some of her issues with reflux and therefore some of her issues with cough.  As noted the patient has not noticed any improvement in her cough since the issue began in October.  Describes the cough as persistent and can occur at anytime during the day.  It is for the most part nonproductive.  She has not had any hemoptysis.  Antibiotics only ameliorated her symptoms perhaps for a day or 2 and then they recurred.  She was also given a trial of Breo Ellipta but it did not seem to help however she is not sure whether she was using the device correctly.  She has not had any orthopnea, paroxysmal nocturnal dyspnea or lower extremity edema.  Past medical history, surgical history, family history has been reviewed.  They are as noted.  She is a lifelong never smoker.   Review of  Systems  Constitutional: Negative.   HENT: Positive for congestion and postnasal drip.   Eyes: Negative.   Respiratory: Positive for cough.   Cardiovascular: Negative.   Gastrointestinal:       Heartburn and gastroesophageal reflux symptoms despite Protonix.  Endocrine: Negative.   Genitourinary: Negative.   Musculoskeletal: Negative.   Skin: Negative.   Allergic/Immunologic: Negative.   Neurological: Negative.   Hematological: Negative.   Psychiatric/Behavioral: Negative.   All other systems reviewed and are negative.      Objective:   Physical Exam Vitals signs reviewed.  Constitutional:      General: She is not in acute distress.    Appearance: She is well-developed and normal weight. She is not ill-appearing.     Comments: Well-groomed.  HENT:     Head: Normocephalic and atraumatic.     Right Ear: External ear normal.     Left Ear: External ear normal.     Nose: Congestion present.     Mouth/Throat:     Mouth: Mucous membranes are moist.     Comments: Clear postnasal drip noted.  Mallampati class II airway. Eyes:     General: No scleral icterus.    Extraocular Movements: Extraocular movements intact.     Conjunctiva/sclera: Conjunctivae normal.     Pupils: Pupils are equal, round, and reactive to light.  Neck:     Musculoskeletal: Neck supple.  Thyroid: No thyromegaly.     Vascular: No JVD.     Trachea: No tracheal deviation.  Cardiovascular:     Rate and Rhythm: Normal rate and regular rhythm.     Pulses: Normal pulses.     Heart sounds: Normal heart sounds. No murmur.  Pulmonary:     Effort: Pulmonary effort is normal. No respiratory distress.     Breath sounds: Wheezing (Musical wheezes particularly on the right.) present. No rhonchi.  Abdominal:     General: Abdomen is flat. Bowel sounds are normal.     Palpations: Abdomen is soft.  Musculoskeletal:        General: No deformity.     Right lower leg: No edema.     Left lower leg: No edema.    Lymphadenopathy:     Cervical: No cervical adenopathy.  Skin:    General: Skin is warm and dry.  Neurological:     General: No focal deficit present.     Mental Status: She is alert and oriented to person, place, and time.  Psychiatric:        Mood and Affect: Mood normal.        Behavior: Behavior normal.     Spirometry was performed and the results were equivocal.  Initial trial showed mild obstruction subsequent trials did not demonstrate this.  She will need formal PFTs.      Assessment & Plan:   1.  Cough: Related to postinflammatory effects after pneumonia.  We will give her a trial of Symbicort 160/4.5, 2 inhalations twice a day with spacer.  This in lieu of trying systemic steroids which the patient wants to avoid.  She does have some wheezing noted on exam today this may be related to airways reactivity with exacerbation.  We will see her in follow-up in 4 to 6 weeks time she is to contact us prior to that time should any new difficulties arise.  2.  Pneumonia no organism specified: Recent chest x-ray of November was consistent with pneumonia on the right.  We will proceed with repeating chest x-ray to ensure that this has cleared.  The patient has had a propensity previously to developed issues with organizing pneumonia.  He may still require systemic steroids to resolve these issues.  3.  Gastroesophageal reflux: Poorly controlled, continue Protonix twice a day.  She was reminded of antireflux measures.  She does have evidence of hiatal hernia noted on CT scan of 2014 and this may have worsened over time.  Need to reevaluate this issue.  Thank you for allowing me to participate in this patient's care.

## 2018-03-18 NOTE — Patient Instructions (Signed)
1.  You will be given a trial of Symbicort 160/4.5, 2 inhalations twice a day with the use of a spacer.  Please rinse your mouth well after use.  This is an anti-inflammatory inhaler.  Please use it whether you think you need or not.  2.  You will have a formal breathing test performed.  3.  We will follow-up with a chest x-ray as he had evidence of pneumonia on the x-ray performed in November.  4.  Please follow antireflux measures that is no eating or drinking for at least 2 hours before you retire in the evening.  Continue to use your Protonix twice a day.  Uncontrolled reflux can also aggravate your cough.    5. We will see you in follow-up in 3 to 4 weeks time.

## 2018-03-20 ENCOUNTER — Ambulatory Visit
Admission: RE | Admit: 2018-03-20 | Discharge: 2018-03-20 | Disposition: A | Discharge status: Home / Self Care | Payer: PPO | Attending: Internal Medicine | Admitting: Internal Medicine

## 2018-03-20 ENCOUNTER — Encounter: Payer: Self-pay | Admitting: Internal Medicine

## 2018-03-20 ENCOUNTER — Ambulatory Visit
Admission: RE | Admit: 2018-03-20 | Discharge: 2018-03-20 | Disposition: A | Discharge status: Home / Self Care | Payer: PPO | Source: Ambulatory Visit | Attending: Internal Medicine | Admitting: Internal Medicine

## 2018-03-20 ENCOUNTER — Ambulatory Visit (INDEPENDENT_AMBULATORY_CARE_PROVIDER_SITE_OTHER): Payer: PPO | Admitting: Internal Medicine

## 2018-03-20 VITALS — BP 136/80 | HR 67 | Temp 97.3°F | Ht 62.0 in | Wt 120.0 lb

## 2018-03-20 DIAGNOSIS — J181 Lobar pneumonia, unspecified organism: Secondary | ICD-10-CM | POA: Diagnosis not present

## 2018-03-20 DIAGNOSIS — J189 Pneumonia, unspecified organism: Secondary | ICD-10-CM

## 2018-03-20 DIAGNOSIS — R05 Cough: Secondary | ICD-10-CM | POA: Diagnosis not present

## 2018-03-20 NOTE — Progress Notes (Signed)
Date:  03/20/2018   Name:  Jodi Bartlett   DOB:  Sep 06, 1935   MRN:  [id number]   Chief Complaint: Pneumonia (Follow up chest xray. Seen pulonologist Wednesday and they said she is still wheezing. )  Cough  This is a new problem. The current episode started 1 to 4 weeks ago. The problem has been gradually improving. The problem occurs every few hours. The cough is productive of sputum. Associated symptoms include wheezing. Pertinent negatives include no chest pain, fever, headaches, shortness of breath or sweats. Treatments tried: antibiotics. The treatment provided moderate relief.    Review of Systems  Constitutional: Positive for unexpected weight change (has lost 2 more lbs). Negative for appetite change, fatigue and fever.  HENT: Negative for trouble swallowing.   Respiratory: Positive for cough and wheezing. Negative for choking, chest tightness and shortness of breath.   Cardiovascular: Negative for chest pain and palpitations.  Neurological: Negative for dizziness, light-headedness and headaches.  Psychiatric/Behavioral: Negative for dysphoric mood. The patient is not nervous/anxious.     Patient Active Problem List   Diagnosis Date Noted  . Gastroesophageal reflux disease without esophagitis 12/31/2017  . Degenerative lumbar spinal stenosis 01/01/2016  . Primary osteoarthritis of both hips 01/01/2016  . Insomnia 07/17/2012  . Anemia 11/13/2011  . DIVERTICULOSIS-COLON 05/29/2009  . Vitamin D deficiency 10/11/2008  . B12 deficiency 10/04/2008  . IBS 10/01/2007  . Anxiety and depression 07/31/2007  . Essential hypertension 07/31/2007  . History of colonic polyps 07/31/2007    No Known Allergies  Past Surgical History:  Procedure Laterality Date  . ABDOMINAL HYSTERECTOMY  1962   ovarian cancer  . APPENDECTOMY    . breast biopsy- benign    . BREAST CYST EXCISION Left   . bunion/morton's neuroma  4/07   right foot   . carpal tunnel release-right    .  CHOLECYSTECTOMY  1994  . colitis transfusion     hosp at 25  . COLONOSCOPY     very difficul- supp with BE past  . EGD tight ues sphincter  7/09   with dialtion  . FOOT SURGERY  10/09   TMT fusion and bunionectomy  . HEMORRHOID SURGERY    . INCISIONAL HERNIA REPAIR     after ccy  . left knee surgery    . left shoulder replacement    . stress cardiolite  9/10  . VIDEO BRONCHOSCOPY  04/22/2012   Procedure: VIDEO BRONCHOSCOPY WITH FLUORO;  Surgeon: Juanito Doom, MD;  Location: [facility];  Service: Cardiopulmonary;  Laterality: Bilateral;    Social History   Tobacco Use  . Smoking status: Never Smoker  . Smokeless tobacco: Never Used  . Tobacco comment: smoking cessation materials not required  Substance Use Topics  . Alcohol use: Yes    Comment: social wine   . Drug use: No     Medication list has been reviewed and updated.  Current Meds  Medication Sig  . acetaminophen (TYLENOL) 500 MG tablet Take 500 mg by mouth every 6 (six) hours as needed.  . ALPRAZolam (XANAX) 0.25 MG tablet Take 0.25 mg by mouth at bedtime as needed for anxiety.  . AMBULATORY NON FORMULARY MEDICATION Medication Name: Aero chamber to use with Symbicort inhaler.  Marland Kitchen amitriptyline (ELAVIL) 50 MG tablet Take 50 mg by mouth at bedtime.  . bisoprolol (ZEBETA) 5 MG tablet Take 0.5 tablets (2.5 mg total) by mouth 2 (two) times daily.  . budesonide-formoterol (SYMBICORT) 160-4.5 MCG/ACT inhaler Inhale  2 puffs into the lungs 2 (two) times daily.  Marland Kitchen buPROPion (WELLBUTRIN SR) 150 MG 12 hr tablet Take 300 mg by mouth daily.   . Cholecalciferol (VITAMIN D3) 2000 units TABS Take by mouth.  . gabapentin (NEURONTIN) 100 MG capsule Take 200 mg by mouth daily. Take 1-3 capsules by mouth at bedtime for RLS   . pantoprazole (PROTONIX) 40 MG tablet Take 1 tablet (40 mg total) by mouth 2 (two) times daily.  . vitamin B-12 (CYANOCOBALAMIN) 1000 MCG tablet Take 1,000 mcg by mouth daily.    PHQ 2/9 Scores 12/31/2017  12/31/2017 06/04/2017 11/18/2016  PHQ - 2 Score 0 0 0 0  PHQ- 9 Score 0 0 0 0    Physical Exam Vitals signs and nursing note reviewed.  Constitutional:      General: She is not in acute distress.    Appearance: She is well-developed and underweight. She is not toxic-appearing.  HENT:     Head: Normocephalic and atraumatic.     Mouth/Throat:     Mouth: Mucous membranes are moist.  Neck:     Musculoskeletal: Normal range of motion and neck supple.  Cardiovascular:     Rate and Rhythm: Normal rate and regular rhythm.  Pulmonary:     Effort: Pulmonary effort is normal. No respiratory distress.     Breath sounds: Normal breath sounds. No wheezing or rhonchi.  Musculoskeletal: Normal range of motion.  Lymphadenopathy:     Cervical: No cervical adenopathy.  Skin:    General: Skin is warm and dry.     Findings: No rash.  Neurological:     Mental Status: She is alert and oriented to person, place, and time.  Psychiatric:        Behavior: Behavior normal.        Thought Content: Thought content normal.    Wt Readings from Last 3 Encounters:  03/20/18 120 lb (54.4 kg)  03/18/18 122 lb 3.2 oz (55.4 kg)  02/12/18 122 lb (55.3 kg)    BP 136/80 (BP Location: Right Arm, Patient Position: Sitting, Cuff Size: Normal)   Pulse 67   Temp (!) 97.3 F (36.3 C) (Oral)   Ht 5\' 2"  (1.575 m)   Wt 120 lb (54.4 kg)   SpO2 97%   BMI 21.95 kg/m   Assessment and Plan: 1. Community acquired pneumonia of right lower lobe of lung ([place]) Continue symbicort bid If Xray still abnormal, will consult Pulmonary MD - DG Chest 2 View; Future   Partially dictated using Editor, commissioning. Any errors are unintentional.  Halina Maidens, MD [facility]  03/20/2018

## 2018-03-27 ENCOUNTER — Telehealth: Payer: Self-pay | Admitting: Internal Medicine

## 2018-03-27 NOTE — Telephone Encounter (Signed)
Spoke with Ms. Jodi Bartlett regarding recommendations from Pulmonary MD.  Dr. Patsey Berthold felt that her CXR changes were resolving post inflammatory findings that do not represent a true infection.  She did not recommend further antibiotics at this time. Ms. Jodi Bartlett reports that she is still coughing but using the inhaler.  She does not feel worse.  She is advised to continue the inhaler and to call pulmonary if her symptoms worsen.

## 2018-04-08 DIAGNOSIS — F329 Major depressive disorder, single episode, unspecified: Secondary | ICD-10-CM | POA: Diagnosis not present

## 2018-04-09 ENCOUNTER — Ambulatory Visit: Payer: PPO | Attending: Pulmonary Disease

## 2018-04-09 DIAGNOSIS — R059 Cough, unspecified: Secondary | ICD-10-CM

## 2018-04-09 DIAGNOSIS — R05 Cough: Secondary | ICD-10-CM

## 2018-04-09 MED ORDER — ALBUTEROL SULFATE (2.5 MG/3ML) 0.083% IN NEBU
2.5000 mg | INHALATION_SOLUTION | Freq: Once | RESPIRATORY_TRACT | Status: AC
  Administered 2018-04-09: 2.5 mg via RESPIRATORY_TRACT
  Filled 2018-04-09: qty 3

## 2018-04-15 ENCOUNTER — Ambulatory Visit: Payer: PPO | Admitting: Pulmonary Disease

## 2018-04-23 ENCOUNTER — Encounter: Payer: Self-pay | Admitting: Pulmonary Disease

## 2018-04-23 ENCOUNTER — Ambulatory Visit: Payer: PPO | Admitting: Pulmonary Disease

## 2018-04-23 VITALS — BP 130/80 | HR 71 | Resp 16 | Ht 62.0 in | Wt 121.0 lb

## 2018-04-23 DIAGNOSIS — R9389 Abnormal findings on diagnostic imaging of other specified body structures: Secondary | ICD-10-CM

## 2018-04-23 DIAGNOSIS — K219 Gastro-esophageal reflux disease without esophagitis: Secondary | ICD-10-CM

## 2018-04-23 DIAGNOSIS — R05 Cough: Secondary | ICD-10-CM | POA: Diagnosis not present

## 2018-04-23 DIAGNOSIS — R059 Cough, unspecified: Secondary | ICD-10-CM

## 2018-04-23 MED ORDER — BUDESONIDE-FORMOTEROL FUMARATE 160-4.5 MCG/ACT IN AERO: 2.0000 | INHALATION_SPRAY | Freq: Two times a day (BID) | RESPIRATORY_TRACT | 0 refills | Status: DC

## 2018-04-23 NOTE — Patient Instructions (Signed)
1.  Continue Symbicort for now.  2.  We will obtain a CT of your chest.  3.  We will see you in follow-up in 2 to 3 weeks time.

## 2018-04-28 ENCOUNTER — Encounter: Payer: Self-pay | Admitting: Pulmonary Disease

## 2018-04-28 NOTE — Progress Notes (Signed)
Subjective:    Patient ID: Jodi Bartlett, female    DOB: 1935-07-04, 83 y.o.   MRN: [id number]  HPI The patient is an 83 year old lifelong never smoker who follows here for the issue of cough.She was first evaluated on 18 March 2018.  The patient at the time had a chest x-ray that showed findings consistent with pneumonia.  X-ray dated November 2019.  We followed this with another chest x-ray that showed improvement but still with some findings of persistent infiltrate.  At the time we instituted therapy with Symbicort as she was noted to have some bronchospasm on exam.  She has had pulmonary function testing on 9 January that showed an FEV1 of 1.48 L which was within normal limits FVC of 1.09 L also within normal limits.  However the FEV1/FVC was 63% which is consistent with very mild airway obstruction.  The patient did not have any air trapping and her lung volumes were normal.  He also showed some mild airways resistance her diffusion capacity was normal.  Though the PFT was interpreted as no evidence of obstruction I disagree on the basis of FEV1/FVC as noted above.  She also during her last visit had evidence of significant gastroesophageal reflux despite the PPI.  He is now following antireflux measures and somewhat better in this regard.  Overall she feels that her cough is somewhat better but she still has daily cough usually productive of thick yellow sputum.  Sputum production is only sporadic.  There is no hemoptysis.   Review of Systems  Constitutional: Negative.   HENT: Positive for postnasal drip.   Eyes: Negative.   Respiratory: Positive for shortness of breath.   Cardiovascular: Negative.   Gastrointestinal: Negative.   Endocrine: Negative.   Genitourinary: Negative.   Musculoskeletal: Negative.   Skin: Negative.   Allergic/Immunologic: Negative.   Neurological: Negative.   Hematological: Negative.   Psychiatric/Behavioral: Negative.   All other systems reviewed and  are negative.      Objective:   Physical Exam Vitals signs reviewed.  Constitutional:      Appearance: Normal appearance. She is normal weight.  HENT:     Head: Normocephalic and atraumatic.     Right Ear: External ear normal.     Left Ear: External ear normal.     Nose: Nose normal.     Mouth/Throat:     Mouth: Mucous membranes are moist.     Pharynx: Oropharynx is clear.  Eyes:     General: No scleral icterus.    Extraocular Movements: Extraocular movements intact.     Conjunctiva/sclera: Conjunctivae normal.     Pupils: Pupils are equal, round, and reactive to light.  Neck:     Musculoskeletal: Neck supple.  Cardiovascular:     Rate and Rhythm: Normal rate and regular rhythm.     Pulses: Normal pulses.     Heart sounds: Normal heart sounds.  Pulmonary:     Effort: Pulmonary effort is normal.     Breath sounds: Normal breath sounds.     Comments: No wheezes noted on today's exam. Abdominal:     General: Abdomen is flat. There is no distension.     Palpations: Abdomen is soft.  Musculoskeletal: Normal range of motion.     Right lower leg: No edema.     Left lower leg: No edema.  Lymphadenopathy:     Cervical: No cervical adenopathy.  Skin:    General: Skin is warm and dry.  Neurological:  General: No focal deficit present.     Mental Status: She is alert and oriented to person, place, and time.  Psychiatric:        Mood and Affect: Mood normal.        Behavior: Behavior normal.           Assessment & Plan:   1.  Cough: Related to postinflammatory effects after pneumonia.  She shows mild evidence of obstruction on PFT and her bronchospasm is improved with Symbicort.  For now would want her to continue Symbicort.  This in lieu of trying systemic steroids which the patient wants to avoid.  The patient is noted to have some persistent changes consistent with infiltrate on chest x-ray will proceed with getting a CT scan of the chest.  We will see her in  follow-up in 2 to 3 weeks time she is to contact us prior to that time should any new difficulties arise.  2.    Persistent infiltrative findings on chest x-ray (abnormal chest x-ray): We will obtain CT chest as noted above.   3.  Gastroesophageal reflux:  She is to continue PPI twice a day.  She has shown previously findings consistent with a hiatal hernia will reevaluate this in her CT of the chest is done.

## 2018-04-29 ENCOUNTER — Ambulatory Visit
Admission: RE | Admit: 2018-04-29 | Discharge: 2018-04-29 | Disposition: A | Discharge status: Home / Self Care | Payer: PPO | Source: Ambulatory Visit | Attending: Pulmonary Disease | Admitting: Pulmonary Disease

## 2018-04-29 DIAGNOSIS — R918 Other nonspecific abnormal finding of lung field: Secondary | ICD-10-CM | POA: Diagnosis not present

## 2018-04-29 DIAGNOSIS — R059 Cough, unspecified: Secondary | ICD-10-CM

## 2018-04-29 DIAGNOSIS — R05 Cough: Secondary | ICD-10-CM

## 2018-04-29 DIAGNOSIS — R9389 Abnormal findings on diagnostic imaging of other specified body structures: Secondary | ICD-10-CM

## 2018-05-07 ENCOUNTER — Ambulatory Visit: Payer: PPO | Admitting: Pulmonary Disease

## 2018-05-07 ENCOUNTER — Telehealth: Payer: Self-pay | Admitting: Pulmonary Disease

## 2018-05-08 NOTE — Telephone Encounter (Signed)
Attempted to reach patient today, no answer. CT was reviewed. Nothing serious except mild inflammation. She has one tiny little spot that we can follow with CT in 1 year.

## 2018-05-08 NOTE — Telephone Encounter (Signed)
LMTCB. Left message informing patient Dr. Darnell Level has not reviewed the Chest CT results yet but we would get her the message.   Dr. Darnell Level please advise of Chest CT results. Thanks.

## 2018-05-11 NOTE — Telephone Encounter (Signed)
Called patient and gave her results of CT. She was concerned about her continued weight loss, she states she did not speak to Dr. Patsey Berthold about this. Will ask her if she has any suggestions for weight gain. Patient is increasing her proteins and eating 3 meals a day plus snacks. Patient states her PCP is aware.

## 2018-05-18 ENCOUNTER — Ambulatory Visit (INDEPENDENT_AMBULATORY_CARE_PROVIDER_SITE_OTHER): Payer: PPO

## 2018-05-18 VITALS — BP 140/90 | HR 81 | Temp 97.6°F | Resp 15 | Ht 62.0 in | Wt 122.0 lb

## 2018-05-18 DIAGNOSIS — Z0000 Encounter for general adult medical examination without abnormal findings: Secondary | ICD-10-CM | POA: Diagnosis not present

## 2018-05-18 NOTE — Progress Notes (Signed)
Subjective:   Jodi Bartlett is a 83 y.o. female who presents for Medicare Annual (Subsequent) preventive examination.  Review of Systems:   Cardiac Risk Factors include: advanced age (>41men, >67 women);hypertension     Objective:     Vitals: Pulse 81   Temp 97.6 F (36.4 C) (Oral)   Resp 15   Ht 5\' 2"  (1.575 m)   Wt 122 lb (55.3 kg)   SpO2 98%   BMI 22.31 kg/m   Body mass index is 22.31 kg/m.  Advanced Directives 05/18/2018 05/14/2017 05/13/2017 01/01/2016  Does Patient Have a Medical Advance Directive? No No No No  Would patient like information on creating a medical advance directive? Yes (MAU/Ambulatory/Procedural Areas - Information given) (No Data) No - Patient declined -    Tobacco Social History   Tobacco Use  Smoking Status Never Smoker  Smokeless Tobacco Never Used  Tobacco Comment   smoking cessation materials not required     Counseling given: Not Answered Comment: smoking cessation materials not required   Clinical Intake:  Pre-visit preparation completed: Yes  Pain : No/denies pain     BMI - recorded: 22.31 Nutritional Status: BMI of 19-24  Normal Nutritional Risks: None Diabetes: No  How often do you need to have someone help you when you read instructions, pamphlets, or other written materials from your doctor or pharmacy?: 1 - Never  Interpreter Needed?: No  Information entered by :: Clemetine Marker LPN  Past Medical History:  Diagnosis Date  . Abnormality of lung on CXR 01/21/2012   Noted on CXR 2013 - CT suggested a mass but follow up scans showed resolution Last CXR 10/2015 - mild apical scarring and mild COPD  . Allergy   . Anemia    hx of  . Anxiety   . Arthritis   . Back pain   . Blood transfusion without reported diagnosis   . Bunion 10/19/2007  . Depressed   . Diverticulosis 2010   Colonoscopy  . Dizziness    vertigo  . GERD (gastroesophageal reflux disease)   . Hiatal hernia    EGD   . HTN (hypertension)   . Hx of  colonic polyps 2010   Colonoscopy  . IBS (irritable bowel syndrome)   . Interstitial cystitis   . OA (ocular albinism) ([place])   . Ovarian cancer ([place])   . Personal history of chemotherapy   . Stricture and stenosis of esophagus 2009   EGD   . UTI (lower urinary tract infection)    hx of     Past Surgical History:  Procedure Laterality Date  . ABDOMINAL HYSTERECTOMY  1962   ovarian cancer  . APPENDECTOMY    . breast biopsy- benign    . BREAST CYST EXCISION Left   . bunion/morton's neuroma  4/07   right foot   . carpal tunnel release-right    . CHOLECYSTECTOMY  1994  . colitis transfusion     hosp at 25  . COLONOSCOPY     very difficul- supp with BE past  . EGD tight ues sphincter  7/09   with dialtion  . FOOT SURGERY  10/09   TMT fusion and bunionectomy  . HEMORRHOID SURGERY    . INCISIONAL HERNIA REPAIR     after ccy  . left knee surgery    . left shoulder replacement    . stress cardiolite  9/10  . VIDEO BRONCHOSCOPY  04/22/2012   Procedure: VIDEO BRONCHOSCOPY WITH FLUORO;  Surgeon: Nathaneil Canary  Jerral Ralph, MD;  Location: [facility];  Service: Cardiopulmonary;  Laterality: Bilateral;   Family History  Problem Relation Age of Onset  . Coronary artery disease Father   . Hypertension Father   . Heart disease Father        CAD  . Stroke Mother   . Hypertension Sister   . Hyperlipidemia Sister   . Ovarian cancer Maternal Grandmother   . Colon cancer Neg Hx   . Esophageal cancer Neg Hx   . Rectal cancer Neg Hx   . Stomach cancer Neg Hx   . Breast cancer Neg Hx    Social History   Socioeconomic History  . Marital status: Widowed    Spouse name: Not on file  . Number of children: 1  . Years of education: Not on file  . Highest education level: Associate degree: occupational, Hotel manager, or vocational program  Occupational History  . 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 Financial resource strain: Not hard at all  . Food insecurity:     Worry: Never true    Inability: Never true  . Transportation needs:    Medical: No    Non-medical: No  Tobacco Use  . Smoking status: Never Smoker  . Smokeless tobacco: Never Used  . Tobacco comment: smoking cessation materials not required  Substance and Sexual Activity  . Alcohol use: Yes    Comment: social wine   . Drug use: No  . Sexual activity: Not Currently  Lifestyle  . Physical activity:    Days per week: 0 days    Minutes per session: 0 min  . Stress: Only a little  Relationships  . Social connections:    Talks on phone: More than three times a week    Gets together: Three times a week    Attends religious service: Never    Active member of club or organization: No    Attends meetings of clubs or organizations: Never    Relationship status: Widowed  Other Topics Concern  . Not on file  Social History Narrative   1 daughter. Self employed, retired Theme park manager. Daily caffeine     Outpatient Encounter Medications as of 05/18/2018  Medication Sig  . acetaminophen (TYLENOL) 500 MG tablet Take 500 mg by mouth every 6 (six) hours as needed.  . ALPRAZolam (XANAX) 0.25 MG tablet Take 0.25 mg by mouth at bedtime as needed for anxiety.  Marland Kitchen amitriptyline (ELAVIL) 50 MG tablet Take 50 mg by mouth at bedtime.  . bisoprolol (ZEBETA) 5 MG tablet Take 0.5 tablets (2.5 mg total) by mouth 2 (two) times daily. (Patient taking differently: Take 5 mg by mouth 2 (two) times daily. )  . budesonide-formoterol (SYMBICORT) 160-4.5 MCG/ACT inhaler Inhale 2 puffs into the lungs 2 (two) times daily.  Marland Kitchen buPROPion (WELLBUTRIN SR) 150 MG 12 hr tablet Take 300 mg by mouth daily.   . Cholecalciferol (VITAMIN D3) 2000 units TABS Take by mouth.  . gabapentin (NEURONTIN) 100 MG capsule Take 200 mg by mouth daily. Take 1-3 capsules by mouth at bedtime for RLS   . pantoprazole (PROTONIX) 40 MG tablet Take 1 tablet (40 mg total) by mouth 2 (two) times daily.  . vitamin B-12 (CYANOCOBALAMIN) 1000 MCG  tablet Take 1,000 mcg by mouth daily.  . [DISCONTINUED] AMBULATORY NON FORMULARY MEDICATION Medication Name: Aero chamber to use with Symbicort inhaler.  . [DISCONTINUED] budesonide-formoterol (SYMBICORT) 160-4.5 MCG/ACT inhaler Inhale 2 puffs into the lungs 2 (two)  times daily.   No facility-administered encounter medications on file as of 05/18/2018.     Activities of Daily Living In your present state of health, do you have any difficulty performing the following activities: 05/18/2018  Hearing? N  Comment declines hearing aids  Vision? N  Difficulty concentrating or making decisions? N  Walking or climbing stairs? N  Dressing or bathing? N  Doing errands, shopping? N  Preparing Food and eating ? N  Using the Toilet? N  In the past six months, have you accidently leaked urine? N  Do you have problems with loss of bowel control? N  Managing your Medications? N  Managing your Finances? N  Housekeeping or managing your Housekeeping? N  Some recent data might be hidden    Patient Care Team: Glean Hess, MD as PCP - General (Internal Medicine) Hessie Knows, MD as Consulting Physician (Orthopedic Surgery) Lucilla Lame, MD as Consulting Physician (Gastroenterology) Renata Caprice as Physician Assistant (Orthopedic Surgery) Lew Dawes, MD as Attending Physician (Psychiatry) Sharlet Salina, MD as Referring Physician (Physical Medicine and Rehabilitation)    Assessment:   This is a routine wellness examination for Erleen.  Exercise Activities and Dietary recommendations Current Exercise Habits: Home exercise routine(pt active at home, keeps her great grandson 2 days per wek)  Goals    . Patient Stated     Patient wants to stay healthy        Fall Risk Fall Risk  05/18/2018 06/04/2017 05/21/2016 01/01/2016 08/03/2013  Falls in the past year? 0 Yes Yes No Yes  Number falls in past yr: 0 2 or more 1 - 1  Comment - - - - back of her shoe caught the sofa  Injury  with Fall? 0 Yes No - No  Risk Factor Category  - High Fall Risk - - -  Risk for fall due to : History of fall(s) History of fall(s) - Other (Comment) -  Follow up Falls prevention discussed Falls evaluation completed - - -   FALL RISK PREVENTION PERTAINING TO THE HOME:  Any stairs in or around the home? Yes  If so, do they handrails? Yes   Home free of loose throw rugs in walkways, pet beds, electrical cords, etc? Yes  Adequate lighting in your home to reduce risk of falls? Yes   ASSISTIVE DEVICES UTILIZED TO PREVENT FALLS:  Life alert? No  Use of a cane, walker or w/c? No  Grab bars in the bathroom? No  Shower chair or bench in shower? No  Elevated toilet seat or a handicapped toilet? No   DME ORDERS:  DME order needed?  No   TIMED UP AND GO:  Was the test performed? Yes .  Length of time to ambulate 10 feet: 5 sec.   GAIT:  Appearance of gait: Gait stead-fast and without the use of an assistive device.   Education: Fall risk prevention has been discussed.  Intervention(s) required? No    Depression Screen PHQ 2/9 Scores 05/18/2018 12/31/2017 12/31/2017 06/04/2017  PHQ - 2 Score 0 0 0 0  PHQ- 9 Score - 0 0 0     Cognitive Function     6CIT Screen 05/18/2018 05/21/2016  What Year? 0 points 0 points  What month? 0 points 0 points  What time? 0 points 0 points  Count back from 20 0 points 0 points  Months in reverse 0 points 0 points  Repeat phrase 0 points 0 points  Total Score 0  0    Immunization History  Administered Date(s) Administered  . Influenza Split 01/21/2012  . Influenza, High Dose Seasonal PF 12/31/2017  . Influenza,inj,Quad PF,6+ Mos 03/16/2014, 01/01/2016  . Pneumococcal Conjugate-13 05/21/2016  . Pneumococcal Polysaccharide-23 08/03/2013  . Td 04/01/1994, 11/18/2007    Qualifies for Shingles Vaccine? Yes . Due for Shingrix. Education has been provided regarding the importance of this vaccine. Pt has been advised to call insurance company to  determine out of pocket expense. Advised may also receive vaccine at local pharmacy or Health Dept. Verbalized acceptance and understanding.  Tdap: Although this vaccine is not a covered service during a Wellness Exam, does the patient still wish to receive this vaccine today?  No .  Education has been provided regarding the importance of this vaccine. Advised may receive this vaccine at local pharmacy or Health Dept. Aware to provide a copy of the vaccination record if obtained from local pharmacy or Health Dept. Verbalized acceptance and understanding.  Flu Vaccine: Up to date  Pneumococcal Vaccine: Up to date   Screening Tests Health Maintenance  Topic Date Due  . MAMMOGRAM  01/29/2017  . TETANUS/TDAP  03/21/2019 (Originally 11/17/2017)  . INFLUENZA VACCINE  Completed  . DEXA SCAN  Completed  . PNA vac Low Risk Adult  Completed    Cancer Screenings:  Colorectal Screening: Completed 01/28/12. No longer required.   Mammogram: Completed 01/30/16. Repeat every year;  Ordered 12/31/17. Pt provided with contact information and advised to call to schedule appt.   Bone Density:  Repeat every 2 years. Ordered 12/31/17. Pt provided with contact information and advised to call to schedule appt.   Lung Cancer Screening: (Low Dose CT Chest recommended if Age 7-80 years, 30 pack-year currently smoking OR have quit w/in 15years.) does not qualify.   Additional Screening:  Hepatitis C Screening: no longer required  Vision Screening: Recommended annual ophthalmology exams for early detection of glaucoma and other disorders of the eye. Is the patient up to date with their annual eye exam?  Yes  Who is the provider or what is the name of the office in which the pt attends annual eye exams? [facility] Screening: Recommended annual dental exams for proper oral hygiene  Community Resource Referral:  CRR required this visit?  No      Plan:      I have personally reviewed  and addressed the Medicare Annual Wellness questionnaire and have noted the following in the patient's chart:  A. Medical and social history B. Use of alcohol, tobacco or illicit drugs  C. Current medications and supplements D. Functional ability and status E.  Nutritional status F.  Physical activity G. Advance directives H. List of other physicians I.  Hospitalizations, surgeries, and ER visits in previous 12 months J.  [place] such as hearing and vision if needed, cognitive and depression L. Referrals and appointments   In addition, I have reviewed and discussed with patient certain preventive protocols, quality metrics, and best practice recommendations. A written personalized care plan for preventive services as well as general preventive health recommendations were provided to patient.   Signed,  Clemetine Marker, LPN Nurse Health Advisor   Nurse Notes: pt concerned that she may have a hernia on the right side of her groin. She plans to follow up with Dr. Army Melia at her visit on 05/21/18.

## 2018-05-18 NOTE — Patient Instructions (Signed)
Jodi Bartlett , Thank you for taking time to come for your Medicare Wellness Visit. I appreciate your ongoing commitment to your health goals. Please review the following plan we discussed and let me know if I can assist you in the future.   Screening recommendations/referrals: Colonoscopy: done 01/28/12 Mammogram: done 01/30/16. Please call [phone] to schedule your mammogram and bone density exam. Recommended yearly ophthalmology/optometry visit for glaucoma screening and checkup Recommended yearly dental visit for hygiene and checkup  Vaccinations: Influenza vaccine: done 12/31/17 Pneumococcal vaccine: done 05/21/16 Tdap vaccine: done 11/18/2007. Due - please contact us if you get a cut or scrape. Shingles vaccine: Shingrix discussed. Please contact your pharmacy for coverage information.   Advanced directives: Advance directive discussed with you today. I have provided a copy for you to complete at home and have notarized. Once this is complete please bring a copy in to our office so we can scan it into your chart.  Conditions/risks identified: Continue healthy eating and physical activity to maintain healthy lifestyle.   Next appointment: Please follow up in one year for your Medicare Annual Wellness visit.     Preventive Care 80 Years and Older, Female Preventive care refers to lifestyle choices and visits with your health care provider that can promote health and wellness. What does preventive care include?  A yearly physical exam. This is also called an annual well check.  Dental exams once or twice a year.  Routine eye exams. Ask your health care provider how often you should have your eyes checked.  Personal lifestyle choices, including:  Daily care of your teeth and gums.  Regular physical activity.  Eating a healthy diet.  Avoiding tobacco and drug use.  Limiting alcohol use.  Practicing safe sex.  Taking low-dose aspirin every day.  Taking vitamin and mineral  supplements as recommended by your health care provider. What happens during an annual well check? The services and screenings done by your health care provider during your annual well check will depend on your age, overall health, lifestyle risk factors, and family history of disease. Counseling  Your health care provider may ask you questions about your:  Alcohol use.  Tobacco use.  Drug use.  Emotional well-being.  Home and relationship well-being.  Sexual activity.  Eating habits.  History of falls.  Memory and ability to understand (cognition).  Work and work Statistician.  Reproductive health. Screening  You may have the following tests or measurements:  Height, weight, and BMI.  Blood pressure.  Lipid and cholesterol levels. These may be checked every 5 years, or more frequently if you are over 31 years old.  Skin check.  Lung cancer screening. You may have this screening every year starting at age 52 if you have a 30-pack-year history of smoking and currently smoke or have quit within the past 15 years.  Fecal occult blood test (FOBT) of the stool. You may have this test every year starting at age 68.  Flexible sigmoidoscopy or colonoscopy. You may have a sigmoidoscopy every 5 years or a colonoscopy every 10 years starting at age 55.  Hepatitis C blood test.  Hepatitis B blood test.  Sexually transmitted disease (STD) testing.  Diabetes screening. This is done by checking your blood sugar (glucose) after you have not eaten for a while (fasting). You may have this done every 1-3 years.  Bone density scan. This is done to screen for osteoporosis. You may have this done starting at age 8.  Mammogram. This may  be done every 1-2 years. Talk to your health care provider about how often you should have regular mammograms. Talk with your health care provider about your test results, treatment options, and if necessary, the need for more tests. Vaccines  Your  health care provider may recommend certain vaccines, such as:  Influenza vaccine. This is recommended every year.  Tetanus, diphtheria, and acellular pertussis (Tdap, Td) vaccine. You may need a Td booster every 10 years.  Zoster vaccine. You may need this after age 5.  Pneumococcal 13-valent conjugate (PCV13) vaccine. One dose is recommended after age 41.  Pneumococcal polysaccharide (PPSV23) vaccine. One dose is recommended after age 25. Talk to your health care provider about which screenings and vaccines you need and how often you need them. This information is not intended to replace advice given to you by your health care provider. Make sure you discuss any questions you have with your health care provider. Document Released: 04/14/2015 Document Revised: 12/06/2015 Document Reviewed: 01/17/2015 Elsevier Interactive Patient Education  2017 [place] Prevention in the Home Falls can cause injuries. They can happen to people of all ages. There are many things you can do to make your home safe and to help prevent falls. What can I do on the outside of my home?  Regularly fix the edges of walkways and driveways and fix any cracks.  Remove anything that might make you trip as you walk through a door, such as a raised step or threshold.  Trim any bushes or trees on the path to your home.  Use bright outdoor lighting.  Clear any walking paths of anything that might make someone trip, such as rocks or tools.  Regularly check to see if handrails are loose or broken. Make sure that both sides of any steps have handrails.  Any raised decks and porches should have guardrails on the edges.  Have any leaves, snow, or ice cleared regularly.  Use sand or salt on walking paths during winter.  Clean up any spills in your garage right away. This includes oil or grease spills. What can I do in the bathroom?  Use night lights.  Install grab bars by the toilet and in the tub and  shower. Do not use towel bars as grab bars.  Use non-skid mats or decals in the tub or shower.  If you need to sit down in the shower, use a plastic, non-slip stool.  Keep the floor dry. Clean up any water that spills on the floor as soon as it happens.  Remove soap buildup in the tub or shower regularly.  Attach bath mats securely with double-sided non-slip rug tape.  Do not have throw rugs and other things on the floor that can make you trip. What can I do in the bedroom?  Use night lights.  Make sure that you have a light by your bed that is easy to reach.  Do not use any sheets or blankets that are too big for your bed. They should not hang down onto the floor.  Have a firm chair that has side arms. You can use this for support while you get dressed.  Do not have throw rugs and other things on the floor that can make you trip. What can I do in the kitchen?  Clean up any spills right away.  Avoid walking on wet floors.  Keep items that you use a lot in easy-to-reach places.  If you need to reach something above you,  use a strong step stool that has a grab bar.  Keep electrical cords out of the way.  Do not use floor polish or wax that makes floors slippery. If you must use wax, use non-skid floor wax.  Do not have throw rugs and other things on the floor that can make you trip. What can I do with my stairs?  Do not leave any items on the stairs.  Make sure that there are handrails on both sides of the stairs and use them. Fix handrails that are broken or loose. Make sure that handrails are as long as the stairways.  Check any carpeting to make sure that it is firmly attached to the stairs. Fix any carpet that is loose or worn.  Avoid having throw rugs at the top or bottom of the stairs. If you do have throw rugs, attach them to the floor with carpet tape.  Make sure that you have a light switch at the top of the stairs and the bottom of the stairs. If you do not  have them, ask someone to add them for you. What else can I do to help prevent falls?  Wear shoes that:  Do not have high heels.  Have rubber bottoms.  Are comfortable and fit you well.  Are closed at the toe. Do not wear sandals.  If you use a stepladder:  Make sure that it is fully opened. Do not climb a closed stepladder.  Make sure that both sides of the stepladder are locked into place.  Ask someone to hold it for you, if possible.  Clearly mark and make sure that you can see:  Any grab bars or handrails.  First and last steps.  Where the edge of each step is.  Use tools that help you move around (mobility aids) if they are needed. These include:  Canes.  Walkers.  Scooters.  Crutches.  Turn on the lights when you go into a dark area. Replace any light bulbs as soon as they burn out.  Set up your furniture so you have a clear path. Avoid moving your furniture around.  If any of your floors are uneven, fix them.  If there are any pets around you, be aware of where they are.  Review your medicines with your doctor. Some medicines can make you feel dizzy. This can increase your chance of falling. Ask your doctor what other things that you can do to help prevent falls. This information is not intended to replace advice given to you by your health care provider. Make sure you discuss any questions you have with your health care provider. Document Released: 01/12/2009 Document Revised: 08/24/2015 Document Reviewed: 04/22/2014 Elsevier Interactive Patient Education  2017 [employer].

## 2018-05-20 ENCOUNTER — Encounter: Payer: Self-pay | Admitting: Pulmonary Disease

## 2018-05-20 ENCOUNTER — Ambulatory Visit: Payer: PPO | Admitting: Pulmonary Disease

## 2018-05-20 VITALS — BP 150/90 | HR 85 | Ht 62.0 in | Wt 126.6 lb

## 2018-05-20 DIAGNOSIS — J479 Bronchiectasis, uncomplicated: Secondary | ICD-10-CM | POA: Diagnosis not present

## 2018-05-20 DIAGNOSIS — K219 Gastro-esophageal reflux disease without esophagitis: Secondary | ICD-10-CM

## 2018-05-20 DIAGNOSIS — J4530 Mild persistent asthma, uncomplicated: Secondary | ICD-10-CM | POA: Diagnosis not present

## 2018-05-20 DIAGNOSIS — R072 Precordial pain: Secondary | ICD-10-CM | POA: Diagnosis not present

## 2018-05-20 NOTE — Progress Notes (Signed)
Subjective:    Patient ID: Jodi Bartlett, female    DOB: 1935/11/26, 83 y.o.   MRN: [id number]  HPI The patient is an 83 year old lifelong never smoker who follows here for the issue of cough.She was first evaluated on 18 March 2018.  The patient at the time had a chest x-ray that showed findings consistent with pneumonia.  X-ray dated November 2019.  We followed this with another chest x-ray that showed improvement but still with some findings of persistent infiltrate.  Since that time she had a chest CT on 30 January that shows complete resolution of the infiltrate.   On her initial visit we instituted therapy with Symbicort as she was noted to have some bronchospasm on exam.  She has had pulmonary function testing on 9 January that showed an FEV1 of 1.48 L which was within normal limits FVC of 1.09 L also within normal limits.  However the FEV1/FVC was 63% which is consistent with mild airway obstruction.  The patient did not have any air trapping and her lung volumes were normal.  He also showed some mild airways resistance her diffusion capacity was normal.  Though the PFT was interpreted as no evidence of obstruction I disagree on the basis of FEV1/FVC as noted above.  She continues to have persistent reflux symptoms despite PPI.  She has a moderate hiatal hernia.  Her cough has improved with Symbicort.  It is almost completely resolved but not completely gone.  CT scan of the chest that was performed on 30 January was independently reviewed.  No infiltrates or masses noted.  She has a minute 4 mm nodule and will need chest CT in a year.  She has some bronchiectasis noted.  No other abnormalities noted.  Today she complains of chest pain that radiates to her left arm and radiates to the back.  She states that she has had this issue previously and had had work-up by cardiology.  She did not notice any dyspnea or diaphoresis with this.  Chest pain is precordial and is concerning.  Review of  Systems  Constitutional: Negative.   HENT: Positive for postnasal drip.   Eyes: Negative.   Respiratory: Positive for cough. Negative for shortness of breath.   Cardiovascular: Positive for chest pain.  Gastrointestinal: Negative.        Persistent gastroesophageal reflux symptoms despite PPI.  Endocrine: Negative.   Genitourinary: Negative.   Musculoskeletal: Negative.   Allergic/Immunologic: Negative.   Neurological: Negative.   Hematological: Negative.   Psychiatric/Behavioral: Negative.   All other systems reviewed and are negative.      Objective:   Physical Exam Vitals signs reviewed.  Constitutional:      Appearance: Normal appearance. She is normal weight.  HENT:     Head: Normocephalic and atraumatic.     Right Ear: External ear normal.     Left Ear: External ear normal.     Nose: Nose normal.     Mouth/Throat:     Mouth: Mucous membranes are moist.     Pharynx: Oropharynx is clear.  Eyes:     General: No scleral icterus.    Extraocular Movements: Extraocular movements intact.     Conjunctiva/sclera: Conjunctivae normal.     Pupils: Pupils are equal, round, and reactive to light.  Neck:     Musculoskeletal: Neck supple.  Cardiovascular:     Rate and Rhythm: Normal rate and regular rhythm.     Pulses: Normal pulses.     Heart  sounds: Normal heart sounds.  Pulmonary:     Effort: Pulmonary effort is normal.     Breath sounds: Normal breath sounds.     Comments: No wheezes noted on today's exam. Abdominal:     General: Abdomen is flat. There is no distension.     Palpations: Abdomen is soft.  Musculoskeletal: Normal range of motion.     Right lower leg: No edema.     Left lower leg: No edema.  Lymphadenopathy:     Cervical: No cervical adenopathy.  Skin:    General: Skin is warm and dry.  Neurological:     General: No focal deficit present.     Mental Status: She is alert and oriented to person, place, and time.  Psychiatric:        Mood and Affect:  Mood normal.        Behavior: Behavior normal.        Assessment & Plan:  1. Cough: Related to postinflammatory effects after pneumonia.  She shows mild evidence of obstruction on PFT and her bronchospasm is improved with Symbicort.  For now would want her to continue Symbicort. This in lieu of trying systemic steroids which the patient wants to avoid.    CT scan of the chest noted no infiltrate.  She does have some mild bronchiectasis this may account for some of her obstructive physiology on PFTs.  This is very mild.  We will continue Symbicort for now but plan to switch it to just inhaled corticosteroid once her symptoms have completely abated.  Currently they are markedly improved.  2.   Persistent infiltrative findings on chest x-ray (abnormal chest x-ray):  CT scan has now shown that this issue has cleared completely.  3. Gastroesophageal reflux: She is to continue PPI twice a day.  She continues to have symptoms with this.  She has moderate hiatal hernia.  May need referral to GI.  4.  Chest pain, precordial:This is somewhat concerning as she has had some radiation to the left arm as well as radiation to the back.  Will refer her to cardiology for further evaluation.  She declined nitroglycerin sublingual today.

## 2018-05-20 NOTE — Patient Instructions (Signed)
1.  You will need a chest CT in 1 year.  2.  We will have Dr. Rockey Situ, cardiology, evaluate your chest pain.  3.  We will see you in follow-up in 3 months time call us sooner than that should any new difficulties arise.

## 2018-05-21 ENCOUNTER — Ambulatory Visit: Payer: PPO | Admitting: Internal Medicine

## 2018-05-21 ENCOUNTER — Ambulatory Visit: Payer: PPO | Admitting: Cardiovascular Disease

## 2018-05-21 ENCOUNTER — Encounter: Payer: Self-pay | Admitting: Cardiovascular Disease

## 2018-05-21 VITALS — BP 180/100 | HR 96 | Ht 62.0 in | Wt 123.0 lb

## 2018-05-21 DIAGNOSIS — G8929 Other chronic pain: Secondary | ICD-10-CM | POA: Diagnosis not present

## 2018-05-21 DIAGNOSIS — K449 Diaphragmatic hernia without obstruction or gangrene: Secondary | ICD-10-CM | POA: Diagnosis not present

## 2018-05-21 DIAGNOSIS — R079 Chest pain, unspecified: Secondary | ICD-10-CM

## 2018-05-21 DIAGNOSIS — I10 Essential (primary) hypertension: Secondary | ICD-10-CM | POA: Diagnosis not present

## 2018-05-21 DIAGNOSIS — I700 Atherosclerosis of aorta: Secondary | ICD-10-CM | POA: Diagnosis not present

## 2018-05-21 DIAGNOSIS — K219 Gastro-esophageal reflux disease without esophagitis: Secondary | ICD-10-CM | POA: Diagnosis not present

## 2018-05-21 DIAGNOSIS — M549 Dorsalgia, unspecified: Secondary | ICD-10-CM | POA: Diagnosis not present

## 2018-05-21 MED ORDER — BISOPROLOL FUMARATE 5 MG PO TABS: 5.0000 mg | ORAL_TABLET | Freq: Two times a day (BID) | ORAL | 3 refills | Status: DC

## 2018-05-21 MED ORDER — BISOPROLOL FUMARATE 5 MG PO TABS: 5.0000 mg | ORAL_TABLET | Freq: Every day | ORAL | 3 refills | Status: DC

## 2018-05-21 NOTE — Patient Instructions (Addendum)
Medication Instructions:  Your physician has recommended you make the following change in your medication:  1. INCREASE Bisoprolol to 5 mg twice a day  If you need a refill on your cardiac medications before your next appointment, please call your pharmacy.    Lab work: No new labs needed   If you have labs (blood work) drawn today and your tests are completely normal, you will receive your results only by: Marland Kitchen MyChart Message (if you have MyChart) OR . A paper copy in the mail If you have any lab test that is abnormal or we need to change your treatment, we will call you to review the results.   Testing/Procedures: We will schedule a treadmill myoview for chest pain/angina [facility] MYOVIEW  Your caregiver has ordered a Stress Test with nuclear imaging. The purpose of this test is to evaluate the blood supply to your heart muscle. This procedure is referred to as a "Non-Invasive Stress Test." This is because other than having an IV started in your vein, nothing is inserted or "invades" your body. Cardiac stress tests are done to find areas of poor blood flow to the heart by determining the extent of coronary artery disease (CAD). Some patients exercise on a treadmill, which naturally increases the blood flow to your heart, while others who are  unable to walk on a treadmill due to physical limitations have a pharmacologic/chemical stress agent called Lexiscan . This medicine will mimic walking on a treadmill by temporarily increasing your coronary blood flow.   Please note: these test may take anywhere between 2-4 hours to complete  PLEASE REPORT TO [facility] AT THE FIRST DESK WILL DIRECT YOU WHERE TO GO  Date of Procedure:_____________________________________  Arrival Time for Procedure:______________________________  Instructions regarding medication:   _XX___:  Hold Bisoprolol the night before procedure and morning of procedure   PLEASE NOTIFY THE  OFFICE AT LEAST 24 HOURS IN ADVANCE IF YOU ARE UNABLE TO KEEP YOUR APPOINTMENT.  [phone] AND  PLEASE NOTIFY NUCLEAR MEDICINE AT [facility] AT LEAST 24 HOURS IN ADVANCE IF YOU ARE UNABLE TO KEEP YOUR APPOINTMENT. [phone]  How to prepare for your Myoview test:  1. Do not eat or drink after midnight 2. No caffeine for 24 hours prior to test 3. No smoking 24 hours prior to test. 4. Your medication may be taken with water.  If your doctor stopped a medication because of this test, do not take that medication. 5. Ladies, please do not wear dresses.  Skirts or pants are appropriate. Please wear a short sleeve shirt. 6. No perfume, cologne or lotion. 7. Wear comfortable walking shoes. No heels!       Follow-Up: At [facility], you and your health needs are our priority.  As part of our continuing mission to provide you with exceptional heart care, we have created designated Provider Care Teams.  These Care Teams include your primary Cardiologist (physician) and Advanced Practice Providers (APPs -  Physician Assistants and Nurse Practitioners) who all work together to provide you with the care you need, when you need it.  . You will need a follow up appointment as needed   . Providers on your designated Care Team:   . Murray Hodgkins, NP . Christell Faith, PA-C . Marrianne Mood, PA-C  Any Other Special Instructions Will Be Listed Below (If Applicable).  For educational health videos Log in to : [email or web address] Or : [email or web address], password : [email or web address]

## 2018-05-21 NOTE — Progress Notes (Signed)
Cardiology Office Note  Date:  05/21/2018   ID:  Jodi Bartlett, Jodi Bartlett 1935/07/25, MRN [id number]  PCP:  Glean Hess, MD   Chief Complaint  Patient presents with  . other    Ref by Dr. Patsey Berthold for chest pain. Pt. c/o mid sternum-pain that radiates to her back, shoulders and down left arm, chest pain and some shortness of breath. Meds reviewed by the pt. verbally.      HPI:  Jodi Bartlett is a very pleasant 83 year old woman with history of  Hyperlipidemia GERD,  anemia,  insomnia, hiatal hernia documented on CT scan and EGD Who presents by referral from Dr. Patsey Berthold for evaluation of her chest pain  Very anxious today blood pressure markedly elevated  She reports that for several weeks to months she has had periodic chest pain radiating through to her back, posterior shoulder, left arm pain Getting worse,  Reports she is active at baseline Unclear if activity brings on her symptoms, sometimes at rest sometimes with activity Does not seem to have clear relationship with food or eating Does not think it is from chronic arthritic issues Hx of reverse shoulder replacement on the left Pain is different from shoulder pain  CT chest, performed recently for pulmonary issues, January 2020 Images pulled up and discussed with her Moderate aortic atherosclerosis noted Chronic hiatal hernia.  Prior hospitalizations discussed with her  In the hospital 05/2017 Hospital records reviewed with the patient in detail UTI, sepsis, ARF weak and deconditioned need of rehab.  EKG personally reviewed by myself on todays visit Shows normal sinus rhythm rate 96 bpm no significant ST or T wave changes  PMH:   has a past medical history of Abnormality of lung on CXR (01/21/2012), Allergy, Anemia, Anxiety, Arthritis, Back pain, Blood transfusion without reported diagnosis, Bunion (10/19/2007), Depressed, Diverticulosis (2010), Dizziness, GERD (gastroesophageal reflux disease), Hiatal hernia, HTN  (hypertension), colonic polyps (2010), IBS (irritable bowel syndrome), Interstitial cystitis, OA (ocular albinism) ([employer]), Ovarian cancer ([place]), Personal history of chemotherapy, Stricture and stenosis of esophagus (2009), and UTI (lower urinary tract infection).  PSH:    Past Surgical History:  Procedure Laterality Date  . ABDOMINAL HYSTERECTOMY  1962   ovarian cancer  . APPENDECTOMY    . breast biopsy- benign    . BREAST CYST EXCISION Left   . bunion/morton's neuroma  4/07   right foot   . carpal tunnel release-right    . CHOLECYSTECTOMY  1994  . colitis transfusion     hosp at 25  . COLONOSCOPY     very difficul- supp with BE past  . EGD tight ues sphincter  7/09   with dialtion  . FOOT SURGERY  10/09   TMT fusion and bunionectomy  . HEMORRHOID SURGERY    . INCISIONAL HERNIA REPAIR     after ccy  . left knee surgery    . left shoulder replacement    . stress cardiolite  9/10  . VIDEO BRONCHOSCOPY  04/22/2012   Procedure: VIDEO BRONCHOSCOPY WITH FLUORO;  Surgeon: Juanito Doom, MD;  Location: [facility];  Service: Cardiopulmonary;  Laterality: Bilateral;    Current Outpatient Medications  Medication Sig Dispense Refill  . acetaminophen (TYLENOL) 500 MG tablet Take 500 mg by mouth every 6 (six) hours as needed.    . ALPRAZolam (XANAX) 0.25 MG tablet Take 0.25 mg by mouth at bedtime as needed for anxiety.    Marland Kitchen amitriptyline (ELAVIL) 50 MG tablet Take 50 mg by mouth at bedtime.    Marland Kitchen  budesonide-formoterol (SYMBICORT) 160-4.5 MCG/ACT inhaler Inhale 2 puffs into the lungs 2 (two) times daily. 1 Inhaler 0  . buPROPion (WELLBUTRIN SR) 150 MG 12 hr tablet Take 300 mg by mouth daily.     . Cholecalciferol (VITAMIN D3) 2000 units TABS Take by mouth.    . gabapentin (NEURONTIN) 100 MG capsule Take 200 mg by mouth daily. Take 1-3 capsules by mouth at bedtime for RLS     . pantoprazole (PROTONIX) 40 MG tablet Take 1 tablet (40 mg total) by mouth 2 (two) times daily. 180 tablet 3  .  vitamin B-12 (CYANOCOBALAMIN) 1000 MCG tablet Take 1,000 mcg by mouth daily.    . bisoprolol (ZEBETA) 5 MG tablet Take 1 tablet (5 mg total) by mouth 2 (two) times daily. 180 tablet 3   No current facility-administered medications for this visit.      Allergies:   Patient has no known allergies.   Social History:  The patient  reports that she has never smoked. She has never used smokeless tobacco. She reports current alcohol use. She reports that she does not use drugs.   Family History:   family history includes Coronary artery disease in her father; Heart disease in her father; Hyperlipidemia in her sister; Hypertension in her father and sister; Ovarian cancer in her maternal grandmother; Stroke in her mother.    Review of Systems: Review of Systems  Constitutional: Negative.   Respiratory: Negative.   Cardiovascular: Positive for chest pain.       Pain radiating through to her back and up to her posterior shoulders  Gastrointestinal: Negative.   Musculoskeletal: Negative.   Neurological: Negative.   Psychiatric/Behavioral: Negative.   All other systems reviewed and are negative.    PHYSICAL EXAM: VS:  BP (!) 180/100   Pulse 96   Ht 5\' 2"  (1.575 m)   Wt 123 lb (55.8 kg)   SpO2 99%   BMI 22.50 kg/m  , BMI Body mass index is 22.5 kg/m. GEN: Well nourished, well developed, in no acute distress  HEENT: normal  Neck: no JVD, carotid bruits, or masses Cardiac: RRR; no murmurs, rubs, or gallops,no edema  Respiratory:  clear to auscultation bilaterally, normal work of breathing GI: soft, nontender, nondistended, + BS MS: no deformity or atrophy  Skin: warm and dry, no rash Neuro:  Strength and sensation are intact Psych: euthymic mood, full affect   Recent Labs: 12/31/2017: ALT 20; BUN 18; Creatinine, Ser 1.08; Hemoglobin 11.0; Platelets 256; Potassium 4.5; Sodium 144; TSH 4.230    Lipid Panel Lab Results  Component Value Date   CHOL 161 12/31/2017   HDL 58  12/31/2017   LDLCALC 90 12/31/2017   TRIG 66 12/31/2017      Wt Readings from Last 3 Encounters:  05/21/18 123 lb (55.8 kg)  05/20/18 126 lb 9.6 oz (57.4 kg)  05/18/18 122 lb (55.3 kg)       ASSESSMENT AND PLAN:  Chest pain, unspecified type - Some typical features concerning for ischemia Aortic atherosclerosis seen on CT scan Denies diabetes and smoking Discussed various options with her, we have ordered a treadmill Myoview to rule out ischemia If stress test shows no significant ischemia, would consider work-up for hiatal hernia, degenerative disc disease  Essential hypertension - Blood pressure markedly elevated today She is taking low-dose of her bisoprolol Recommend she increase the dose up to 5 mg twice daily We did review numerous office records over the past year and typically systolic pressure in  the 130 range Blood pressure elevated possibly from anxiety from her visit today  Hiatal hernia CT scan images reviewed with her She does have GERD symptoms, on PPI  Gastroesophageal reflux disease without esophagitis  Recommend she continue her PPI  Chronic back pain, unspecified back location, unspecified back pain laterality She does report DJD Cortisone in the past Unclear if this is contributing to her chest and back pain  Aortic atherosclerosis Discussed mechanism, rate factors Would recommend LDL less than 70  Disposition:   F/U as needed  Long discussion concerning markedly elevated blood pressure, strategies, medication management Discussed work-up for ischemic heart disease Discussed risk factors in detail Other causes of back pain chest pain shoulder pain  Total encounter time more than 60 minutes  Greater than 50% was spent in counseling and coordination of care with the patient    Orders Placed This Encounter  Procedures  . NM Myocar Multi W/Spect W/Wall Motion / EF  . EKG 12-Lead     Signed, Esmond Plants, M.D., Ph.D. 05/21/2018  [place], [place]

## 2018-05-22 ENCOUNTER — Encounter: Payer: Self-pay | Admitting: Pulmonary Disease

## 2018-05-25 ENCOUNTER — Ambulatory Visit (INDEPENDENT_AMBULATORY_CARE_PROVIDER_SITE_OTHER): Payer: PPO | Admitting: Internal Medicine

## 2018-05-25 ENCOUNTER — Other Ambulatory Visit: Payer: Self-pay

## 2018-05-25 ENCOUNTER — Encounter: Payer: Self-pay | Admitting: Internal Medicine

## 2018-05-25 VITALS — BP 140/80 | HR 56 | Ht 62.0 in | Wt 123.0 lb

## 2018-05-25 DIAGNOSIS — K219 Gastro-esophageal reflux disease without esophagitis: Secondary | ICD-10-CM

## 2018-05-25 DIAGNOSIS — R05 Cough: Secondary | ICD-10-CM

## 2018-05-25 DIAGNOSIS — R059 Cough, unspecified: Secondary | ICD-10-CM

## 2018-05-25 DIAGNOSIS — K4020 Bilateral inguinal hernia, without obstruction or gangrene, not specified as recurrent: Secondary | ICD-10-CM | POA: Diagnosis not present

## 2018-05-25 DIAGNOSIS — F419 Anxiety disorder, unspecified: Secondary | ICD-10-CM

## 2018-05-25 DIAGNOSIS — I700 Atherosclerosis of aorta: Secondary | ICD-10-CM | POA: Diagnosis not present

## 2018-05-25 DIAGNOSIS — I10 Essential (primary) hypertension: Secondary | ICD-10-CM | POA: Diagnosis not present

## 2018-05-25 DIAGNOSIS — R0789 Other chest pain: Secondary | ICD-10-CM | POA: Diagnosis not present

## 2018-05-25 DIAGNOSIS — F329 Major depressive disorder, single episode, unspecified: Secondary | ICD-10-CM | POA: Diagnosis not present

## 2018-05-25 MED ORDER — PANTOPRAZOLE SODIUM 40 MG PO TBEC: 40.0000 mg | DELAYED_RELEASE_TABLET | Freq: Two times a day (BID) | ORAL | 3 refills | Status: DC

## 2018-05-25 NOTE — Progress Notes (Signed)
Date:  05/25/2018   Name:  Jodi Bartlett   DOB:  [birth year]   MRN:  [id number]   Chief Complaint: Hypertension  Hypertension  This is a chronic problem. The problem has been gradually improving since onset. The problem is controlled. Associated symptoms include chest pain. Pertinent negatives include no headaches, palpitations or shortness of breath. Past treatments include beta blockers (increased to bid).  Depression         This is a chronic problem.  Associated symptoms include no fatigue and no headaches.  Past treatments include other medications (followed by psych - on bupropion, elavil and xanax). Gastroesophageal Reflux  She complains of chest pain and heartburn. She reports no abdominal pain, no coughing or no wheezing. This is a recurrent problem. The problem occurs occasionally. Pertinent negatives include no fatigue. She has tried a PPI for the symptoms. The treatment provided moderate (but still having sx despite bid dosing) relief.  Chest pain - seen by Pulmonary and referred to Cardiology.  Stress Myoview is being scheduled. Cough - seen by Pulmonary, felt to have some mild bronchiectasis.  CT lung abnormality resolved with last scan.  She was put on Symbicort with good improvement with the plan to continue it until symptoms have completely resolved. Groin swelling - pt has noticed that the right groin has some swelling.  It is not tender.  CT abd/pelvis recently confirmed the presence of 2 fat containing inguinal hernias.   Review of Systems  Constitutional: Negative for chills, fatigue, fever and unexpected weight change.  Respiratory: Negative for cough, chest tightness, shortness of breath and wheezing.   Cardiovascular: Positive for chest pain. Negative for palpitations and leg swelling.  Gastrointestinal: Positive for heartburn. Negative for abdominal distention, abdominal pain, blood in stool, constipation and diarrhea.       Swelling in right groin  Neurological:  Negative for dizziness and headaches.  Psychiatric/Behavioral: Positive for depression. Negative for dysphoric mood and sleep disturbance.   CT [place] study 05/2017: IMPRESSION: 1. Nonspecific mild edema involving the mesentery. Query mesenteric venous congestion or mild mesenteritis. 2. No acute abnormalities otherwise involving the abdomen or pelvis. 3. Nonobstructing 2 mm calculus in a mid calyx of the right kidney. No urinary tract calculi elsewhere on either side. 4. Scarring involving the lower pole the right kidney. Benign cyst involving the lower pole the right kidney. 5. Moderate-sized hiatal hernia, stable since 2013. 6. Aortic Atherosclerosis (ICD10-170.0) 7. Skeletal findings as above, including multilevel mild-to-moderate spinal stenosis.  Patient Active Problem List   Diagnosis Date Noted  . Inguinal hernia bilateral, non-recurrent 05/25/2018  . Hiatal hernia 05/21/2018  . Aortic atherosclerosis ([place]) 05/21/2018  . Gastroesophageal reflux disease without esophagitis 12/31/2017  . Degenerative lumbar spinal stenosis 01/01/2016  . Primary osteoarthritis of both hips 01/01/2016  . Insomnia 07/17/2012  . Chest pain 06/23/2012  . Cough 03/02/2012  . Anemia 11/13/2011  . DIVERTICULOSIS-COLON 05/29/2009  . Vitamin D deficiency 10/11/2008  . B12 deficiency 10/04/2008  . IBS 10/01/2007  . Anxiety and depression 07/31/2007  . Essential hypertension 07/31/2007  . Chronic back pain 07/31/2007  . History of colonic polyps 07/31/2007    No Known Allergies  Past Surgical History:  Procedure Laterality Date  . ABDOMINAL HYSTERECTOMY  1962   ovarian cancer  . APPENDECTOMY    . breast biopsy- benign    . BREAST CYST EXCISION Left   . bunion/morton's neuroma  4/07   right foot   . carpal tunnel release-right    .  CHOLECYSTECTOMY  1994  . colitis transfusion     hosp at 25  . COLONOSCOPY     very difficul- supp with BE past  . EGD tight ues sphincter  7/09   with  dialtion  . FOOT SURGERY  10/09   TMT fusion and bunionectomy  . HEMORRHOID SURGERY    . INCISIONAL HERNIA REPAIR     after ccy  . left knee surgery    . left shoulder replacement    . stress cardiolite  9/10  . VIDEO BRONCHOSCOPY  04/22/2012   Procedure: VIDEO BRONCHOSCOPY WITH FLUORO;  Surgeon: Juanito Doom, MD;  Location: [facility];  Service: Cardiopulmonary;  Laterality: Bilateral;    Social History   Tobacco Use  . Smoking status: Never Smoker  . Smokeless tobacco: Never Used  . Tobacco comment: smoking cessation materials not required  Substance Use Topics  . Alcohol use: Yes    Comment: social wine   . Drug use: No     Medication list has been reviewed and updated.  Current Meds  Medication Sig  . acetaminophen (TYLENOL) 500 MG tablet Take 500 mg by mouth every 6 (six) hours as needed.  . ALPRAZolam (XANAX) 0.25 MG tablet Take 0.25 mg by mouth at bedtime as needed for anxiety.  Marland Kitchen amitriptyline (ELAVIL) 50 MG tablet Take 50 mg by mouth at bedtime.  . bisoprolol (ZEBETA) 5 MG tablet Take 1 tablet (5 mg total) by mouth 2 (two) times daily.  . budesonide-formoterol (SYMBICORT) 160-4.5 MCG/ACT inhaler Inhale 2 puffs into the lungs 2 (two) times daily.  Marland Kitchen buPROPion (WELLBUTRIN SR) 150 MG 12 hr tablet Take 300 mg by mouth daily.   . Cholecalciferol (VITAMIN D3) 2000 units TABS Take by mouth.  . gabapentin (NEURONTIN) 100 MG capsule Take 200 mg by mouth daily. Take 1-3 capsules by mouth at bedtime for RLS   . pantoprazole (PROTONIX) 40 MG tablet Take 1 tablet (40 mg total) by mouth 2 (two) times daily.  . vitamin B-12 (CYANOCOBALAMIN) 1000 MCG tablet Take 1,000 mcg by mouth daily.    PHQ 2/9 Scores 05/25/2018 05/18/2018 12/31/2017 12/31/2017  PHQ - 2 Score 0 0 0 0  PHQ- 9 Score - - 0 0   Wt Readings from Last 3 Encounters:  05/25/18 123 lb (55.8 kg)  05/21/18 123 lb (55.8 kg)  05/20/18 126 lb 9.6 oz (57.4 kg)    Physical Exam Vitals signs and nursing note  reviewed.  Constitutional:      General: She is not in acute distress.    Appearance: She is well-developed.  HENT:     Head: Normocephalic and atraumatic.     Nose: Nose normal.  Eyes:     Pupils: Pupils are equal, round, and reactive to light.  Neck:     Musculoskeletal: Normal range of motion and neck supple.  Cardiovascular:     Rate and Rhythm: Normal rate and regular rhythm.     Pulses: Normal pulses.     Heart sounds: No murmur.  Pulmonary:     Effort: Pulmonary effort is normal. No respiratory distress.     Breath sounds: Normal breath sounds. No decreased breath sounds, wheezing or rhonchi.  Abdominal:     General: Abdomen is flat. Bowel sounds are normal. There is no distension.     Tenderness: There is no abdominal tenderness.     Hernia: A hernia is present. Hernia is present in the right inguinal area and left inguinal area.  Comments: Soft, non tender bilateral inguinal hernias  Musculoskeletal: Normal range of motion.  Lymphadenopathy:     Lower Body: Right inguinal adenopathy (palpable node - non tender, soft, mobile) present.  Skin:    General: Skin is warm and dry.     Findings: No rash.  Neurological:     Mental Status: She is alert and oriented to person, place, and time.  Psychiatric:        Behavior: Behavior normal.        Thought Content: Thought content normal.     BP 140/80   Pulse (!) 56   Ht 5\' 2"  (1.575 m)   Wt 123 lb (55.8 kg)   SpO2 98%   BMI 22.50 kg/m   Assessment and Plan: 1. Essential hypertension Controlled will increased dose of Bisoprolol  2. Gastroesophageal reflux disease without esophagitis Continue bid dosing If sx continue, may need GI consult - pantoprazole (PROTONIX) 40 MG tablet; Take 1 tablet (40 mg total) by mouth 2 (two) times daily.  Dispense: 180 tablet; Refill: 3  3. Anxiety and depression Improved, weight stable  4. Other chest pain Workup underway  5. Aortic atherosclerosis ([place]) Will need to discuss  statin therapy if positive stress testing  6. Cough Improved, continue symbicort  7. Bilateral inguinal hernia without obstruction or gangrene, recurrence not specified Pt reassured, call for referral if enlarging and/or symptomatic Palpable lymph node likely normal given pt thin habitus  Partially dictated using Editor, commissioning. Any errors are unintentional.  Halina Maidens, MD [facility]  05/25/2018

## 2018-06-04 ENCOUNTER — Ambulatory Visit: Payer: PPO

## 2018-09-25 ENCOUNTER — Ambulatory Visit: Payer: PPO | Admitting: Internal Medicine

## 2018-12-11 DIAGNOSIS — F329 Major depressive disorder, single episode, unspecified: Secondary | ICD-10-CM | POA: Diagnosis not present

## 2019-01-11 ENCOUNTER — Encounter: Payer: Self-pay | Admitting: Internal Medicine

## 2019-01-11 ENCOUNTER — Ambulatory Visit (INDEPENDENT_AMBULATORY_CARE_PROVIDER_SITE_OTHER): Payer: PPO | Admitting: Internal Medicine

## 2019-01-11 ENCOUNTER — Other Ambulatory Visit: Payer: Self-pay

## 2019-01-11 VITALS — BP 181/88 | Ht 62.0 in | Wt 123.0 lb

## 2019-01-11 DIAGNOSIS — I10 Essential (primary) hypertension: Secondary | ICD-10-CM

## 2019-01-11 DIAGNOSIS — J471 Bronchiectasis with (acute) exacerbation: Secondary | ICD-10-CM

## 2019-01-11 MED ORDER — AZITHROMYCIN 250 MG PO TABS: ORAL_TABLET | ORAL | 0 refills | Status: AC

## 2019-01-11 MED ORDER — LOSARTAN POTASSIUM 50 MG PO TABS: 50.0000 mg | ORAL_TABLET | Freq: Every day | ORAL | 0 refills | Status: DC

## 2019-01-11 NOTE — Progress Notes (Signed)
Date:  01/11/2019   Name:  Jodi Bartlett   DOB:  1935/06/17   MRN:  [id number]  I connected with this patient, Jodi Bartlett, by telephone at the patient's home.  I verified that I am speaking with the correct person using two identifiers. This visit was conducted via telephone due to the Covid-19 outbreak from my office at [facility] in [place], [place]. I discussed the limitations, risks, security and privacy concerns of performing an evaluation and management service by telephone. I also discussed with the patient that there may be a patient responsible charge related to this service. The patient expressed understanding and agreed to proceed.  Chief Complaint: Cough (has had for few weeks denies fever. Symbicort wa snever refilled and no longer follows pulminology ) and Hypertension (BP high using Wrist monitor. Also took cough meds last night wasnt sure if that could bring it up. )  Cough This is a new problem. The current episode started 1 to 4 weeks ago (three weeks). The problem has been unchanged. The problem occurs hourly. The cough is productive of sputum. Associated symptoms include headaches, shortness of breath and wheezing. Pertinent negatives include no chest pain, chills, fever, nasal congestion, postnasal drip, sore throat or sweats. She has tried OTC cough suppressant (coricidin and robitussin ) for the symptoms. The treatment provided mild relief.  Hypertension This is a chronic problem. The problem is uncontrolled (BP at home in the 170 range with wrist cuff). Associated symptoms include headaches and shortness of breath. Pertinent negatives include no chest pain, palpitations or sweats. There are no associated agents to hypertension. Past treatments include beta blockers. The current treatment provides mild improvement.  She was seen by cardiology in February.  Stress test planned but never done due to covid-19.   Pt also seen by Pulmonary and given symbicort in February -  did not notice much benefit but symptoms improved with time. She has not been using the inhaler and was doing well until a cold virus in September.  She denies exposure to Covid and feels well other than the productive cough and mild headache for the past three weeks.  Review of Systems  Constitutional: Negative for chills and fever.  HENT: Negative for congestion, postnasal drip, sinus pressure, sinus pain and sore throat.   Respiratory: Positive for cough, shortness of breath and wheezing.   Cardiovascular: Negative for chest pain, palpitations and leg swelling.  Gastrointestinal: Negative for abdominal pain, constipation and diarrhea.  Neurological: Positive for headaches. Negative for dizziness and light-headedness.  Psychiatric/Behavioral: Negative for dysphoric mood and sleep disturbance. The patient is not nervous/anxious.     Patient Active Problem List   Diagnosis Date Noted  . Inguinal hernia bilateral, non-recurrent 05/25/2018  . Hiatal hernia 05/21/2018  . Aortic atherosclerosis ([place]) 05/21/2018  . Gastroesophageal reflux disease without esophagitis 12/31/2017  . Degenerative lumbar spinal stenosis 01/01/2016  . Primary osteoarthritis of both hips 01/01/2016  . Insomnia 07/17/2012  . Chest pain 06/23/2012  . Cough 03/02/2012  . Anemia 11/13/2011  . DIVERTICULOSIS-COLON 05/29/2009  . Vitamin D deficiency 10/11/2008  . B12 deficiency 10/04/2008  . IBS 10/01/2007  . Anxiety and depression 07/31/2007  . Essential hypertension 07/31/2007  . Chronic back pain 07/31/2007  . History of colonic polyps 07/31/2007    No Known Allergies  Past Surgical History:  Procedure Laterality Date  . ABDOMINAL HYSTERECTOMY  1962   ovarian cancer  . APPENDECTOMY    . breast biopsy- benign    .  BREAST CYST EXCISION Left   . bunion/morton's neuroma  4/07   right foot   . carpal tunnel release-right    . CHOLECYSTECTOMY  1994  . colitis transfusion     hosp at 25  . COLONOSCOPY      very difficul- supp with BE past  . EGD tight ues sphincter  7/09   with dialtion  . FOOT SURGERY  10/09   TMT fusion and bunionectomy  . HEMORRHOID SURGERY    . INCISIONAL HERNIA REPAIR     after ccy  . left knee surgery    . left shoulder replacement    . stress cardiolite  9/10  . VIDEO BRONCHOSCOPY  04/22/2012   Procedure: VIDEO BRONCHOSCOPY WITH FLUORO;  Surgeon: Juanito Doom, MD;  Location: [facility];  Service: Cardiopulmonary;  Laterality: Bilateral;    Social History   Tobacco Use  . Smoking status: Never Smoker  . Smokeless tobacco: Never Used  . Tobacco comment: smoking cessation materials not required  Substance Use Topics  . Alcohol use: Yes    Comment: social wine   . Drug use: No     Medication list has been reviewed and updated.  Current Meds  Medication Sig  . acetaminophen (TYLENOL) 500 MG tablet Take 500 mg by mouth every 6 (six) hours as needed.  . ALPRAZolam (XANAX) 0.25 MG tablet Take 0.25 mg by mouth at bedtime as needed for anxiety.  Marland Kitchen amitriptyline (ELAVIL) 50 MG tablet Take 50 mg by mouth at bedtime.  . bisoprolol (ZEBETA) 5 MG tablet Take 1 tablet (5 mg total) by mouth 2 (two) times daily.  Marland Kitchen buPROPion (WELLBUTRIN SR) 150 MG 12 hr tablet Take 300 mg by mouth daily.   . Cholecalciferol (VITAMIN D3) 2000 units TABS Take by mouth.  . gabapentin (NEURONTIN) 100 MG capsule Take 200 mg by mouth daily. Take 1-3 capsules by mouth at bedtime for RLS   . guaiFENesin (ROBITUSSIN) 100 MG/5ML liquid Take 200 mg by mouth 3 (three) times daily as needed for cough.  . pantoprazole (PROTONIX) 40 MG tablet Take 1 tablet (40 mg total) by mouth 2 (two) times daily.  . vitamin B-12 (CYANOCOBALAMIN) 1000 MCG tablet Take 1,000 mcg by mouth daily.    PHQ 2/9 Scores 01/11/2019 05/25/2018 05/18/2018 12/31/2017  PHQ - 2 Score 0 0 0 0  PHQ- 9 Score 0 - - 0    BP Readings from Last 3 Encounters:  01/11/19 (!) 181/88  05/25/18 140/80  05/21/18 (!) 180/100     Physical Exam Pulmonary:     Effort: Pulmonary effort is normal.     Comments: Loose raspy cough heard frequently during the call. No hoarseness or apparent SOB. Neurological:     Mental Status: She is alert.  Psychiatric:        Attention and Perception: Attention normal.        Mood and Affect: Mood normal.        Speech: Speech normal.        Cognition and Memory: Cognition normal.     Wt Readings from Last 3 Encounters:  01/11/19 123 lb (55.8 kg)  05/25/18 123 lb (55.8 kg)  05/21/18 123 lb (55.8 kg)    BP (!) 181/88   Ht 5\' 2"  (1.575 m) Comment: last charted height  Wt 123 lb (55.8 kg) Comment: last charted weight  BMI 22.50 kg/m   Assessment and Plan: 1. Bronchiectasis with acute exacerbation ([place]) Hold on symbicort for now as pt  did not perceive much benefit Will evaluate in office at next appt in 3 weeks Continue robitussin DM otc - azithromycin (ZITHROMAX Z-PAK) 250 MG tablet; UAD  Dispense: 6 each; Refill: 0  2. Essential hypertension Pt is not controlled on current regimen of zebeta 5 mg bid.  She has been seen by cardiology but stress test postponed due to Covid. Will add losartan 50 mg daily and reassess at next visit - losartan (COZAAR) 50 MG tablet; Take 1 tablet (50 mg total) by mouth daily.  Dispense: 90 tablet; Refill: 0  I spent 15 minutes on this encounter. Partially dictated using Editor, commissioning. Any errors are unintentional.  Halina Maidens, MD [facility]  01/11/2019

## 2019-01-18 ENCOUNTER — Telehealth: Payer: Self-pay

## 2019-01-18 NOTE — Telephone Encounter (Signed)
Patient called saying she had a visit last week and was seen for a cough and high BP.  Was put on a Zpack and Losartan 50 mg daily for BP.  She is calling today to state she still has a cough and her BP is still high at 188/88.  Dr. Army Melia was informed and said to continue medication for now. Zpack is still working in her system for 3 more days and BP medication needs to work in her system longer to have effects.   She verbalized understanding and will follow up in 2 weeks at her CPE for her BP. Told her to call the office if her diastolic number stays elevated in late 90 or 100's.  She verbalized understanding of this.

## 2019-01-28 ENCOUNTER — Emergency Department: Payer: PPO

## 2019-01-28 ENCOUNTER — Ambulatory Visit
Admission: EM | Admit: 2019-01-28 | Discharge: 2019-01-28 | Disposition: A | Discharge status: Home / Self Care | Payer: PPO | Source: Home / Self Care

## 2019-01-28 ENCOUNTER — Other Ambulatory Visit: Payer: Self-pay

## 2019-01-28 ENCOUNTER — Emergency Department
Admission: EM | Admit: 2019-01-28 | Discharge: 2019-01-28 | Disposition: A | Discharge status: Home / Self Care | Payer: PPO | Attending: Emergency Medicine | Admitting: Emergency Medicine

## 2019-01-28 ENCOUNTER — Telehealth: Payer: Self-pay

## 2019-01-28 ENCOUNTER — Encounter: Payer: Self-pay | Admitting: Emergency Medicine

## 2019-01-28 DIAGNOSIS — I6523 Occlusion and stenosis of bilateral carotid arteries: Secondary | ICD-10-CM | POA: Diagnosis not present

## 2019-01-28 DIAGNOSIS — Z79899 Other long term (current) drug therapy: Secondary | ICD-10-CM | POA: Diagnosis not present

## 2019-01-28 DIAGNOSIS — Z96612 Presence of left artificial shoulder joint: Secondary | ICD-10-CM | POA: Diagnosis not present

## 2019-01-28 DIAGNOSIS — I10 Essential (primary) hypertension: Secondary | ICD-10-CM | POA: Insufficient documentation

## 2019-01-28 DIAGNOSIS — R519 Headache, unspecified: Secondary | ICD-10-CM

## 2019-01-28 DIAGNOSIS — Z8543 Personal history of malignant neoplasm of ovary: Secondary | ICD-10-CM | POA: Diagnosis not present

## 2019-01-28 LAB — CBC WITH DIFFERENTIAL/PLATELET
Abs Immature Granulocytes: 0.04 10*3/uL (ref 0.00–0.07)
Basophils Absolute: 0 10*3/uL (ref 0.0–0.1)
Basophils Relative: 0 %
Eosinophils Absolute: 0 10*3/uL (ref 0.0–0.5)
Eosinophils Relative: 1 %
HCT: 37.4 % (ref 36.0–46.0)
Hemoglobin: 12.3 g/dL (ref 12.0–15.0)
Immature Granulocytes: 1 %
Lymphocytes Relative: 19 %
Lymphs Abs: 1.3 10*3/uL (ref 0.7–4.0)
MCH: 32.3 pg (ref 26.0–34.0)
MCHC: 32.9 g/dL (ref 30.0–36.0)
MCV: 98.2 fL (ref 80.0–100.0)
Monocytes Absolute: 0.4 10*3/uL (ref 0.1–1.0)
Monocytes Relative: 6 %
Neutro Abs: 5 10*3/uL (ref 1.7–7.7)
Neutrophils Relative %: 73 %
Platelets: 266 10*3/uL (ref 150–400)
RBC: 3.81 MIL/uL — ABNORMAL LOW (ref 3.87–5.11)
RDW: 12.3 % (ref 11.5–15.5)
WBC: 6.8 10*3/uL (ref 4.0–10.5)
nRBC: 0 % (ref 0.0–0.2)

## 2019-01-28 LAB — PROTIME-INR
INR: 1 (ref 0.8–1.2)
Prothrombin Time: 12.8 seconds (ref 11.4–15.2)

## 2019-01-28 LAB — BASIC METABOLIC PANEL
Anion gap: 9 (ref 5–15)
BUN: 16 mg/dL (ref 8–23)
CO2: 31 mmol/L (ref 22–32)
Calcium: 9.8 mg/dL (ref 8.9–10.3)
Chloride: 99 mmol/L (ref 98–111)
Creatinine, Ser: 0.78 mg/dL (ref 0.44–1.00)
GFR calc Af Amer: >60 mL/min (ref >60)
GFR calc non Af Amer: >60 mL/min (ref >60)
Glucose, Bld: 121 mg/dL — ABNORMAL HIGH (ref 70–99)
Potassium: 3.7 mmol/L (ref 3.5–5.1)
Sodium: 139 mmol/L (ref 135–145)

## 2019-01-28 MED ORDER — IOHEXOL 350 MG/ML SOLN
75.0000 mL | Freq: Once | INTRAVENOUS | Status: AC | PRN
  Administered 2019-01-28: 75 mL via INTRAVENOUS
  Filled 2019-01-28: qty 75

## 2019-01-28 MED ORDER — PROCHLORPERAZINE EDISYLATE 10 MG/2ML IJ SOLN
10.0000 mg | Freq: Once | INTRAMUSCULAR | Status: AC
  Administered 2019-01-28: 10 mg via INTRAVENOUS
  Filled 2019-01-28: qty 2

## 2019-01-28 MED ORDER — ONDANSETRON 4 MG PO TBDP
4.0000 mg | ORAL_TABLET | Freq: Once | ORAL | Status: AC
  Administered 2019-01-28: 4 mg via ORAL

## 2019-01-28 MED ORDER — DIPHENHYDRAMINE HCL 50 MG/ML IJ SOLN
25.0000 mg | Freq: Once | INTRAMUSCULAR | Status: AC
  Administered 2019-01-28: 25 mg via INTRAVENOUS
  Filled 2019-01-28: qty 1

## 2019-01-28 MED ORDER — METOCLOPRAMIDE HCL 5 MG/ML IJ SOLN
10.0000 mg | Freq: Once | INTRAMUSCULAR | Status: AC
  Administered 2019-01-28: 10 mg via INTRAVENOUS
  Filled 2019-01-28: qty 2

## 2019-01-28 NOTE — ED Notes (Signed)
First RN Note: Pt presents to ED via POV from [place] with c/o HA with vomiting since yesterday.

## 2019-01-28 NOTE — Discharge Instructions (Addendum)
Go directly to emergency room.  

## 2019-01-28 NOTE — ED Triage Notes (Signed)
Pt sent from [place] with c/o " worst headache of her life"  Since last night. Pt also states she started vomiting today. Pt ambulatory, speech clear, denies weakness or numbness. Denies any acute injuries or anticoag use.

## 2019-01-28 NOTE — ED Provider Notes (Signed)
[facility] Emergency Department Provider Note   ____________________________________________   First MD Initiated Contact with Patient 01/28/19 1606     (approximate)  I have reviewed the triage vital signs and the nursing notes.   HISTORY  Chief Complaint Headache    HPI Jodi Bartlett is a 83 y.o. female with past medical history of hypertension and GERD who presents to the ED complaining of headache.  Patient reports she had a mild headache yesterday that seemed to acutely worsen around 10:30 PM.  She describes it as a diffuse throbbing that is exacerbated by light and sound.  Today it was associated with nausea and 3 episodes of nonbilious and nonbloody vomiting.  She denies any vision changes, speech changes, numbness, or weakness.  She has dealt with headaches in the past, but usually symptoms are alleviated by Tylenol.  She took some Tylenol earlier today without relief and describes current headache as the worst she has ever had.  She was initially evaluated at an urgent care and referred to the ED for further evaluation.        Past Medical History:  Diagnosis Date  . Abnormality of lung on CXR 01/21/2012   Noted on CXR 2013 - CT suggested a mass but follow up scans showed resolution Last CXR 10/2015 - mild apical scarring and mild COPD  . Allergy   . Anemia    hx of  . Anxiety   . Arthritis   . Back pain   . Blood transfusion without reported diagnosis   . Bunion 10/19/2007  . Depressed   . Diverticulosis 2010   Colonoscopy  . Dizziness    vertigo  . GERD (gastroesophageal reflux disease)   . Hiatal hernia    EGD   . HTN (hypertension)   . Hx of colonic polyps 2010   Colonoscopy  . IBS (irritable bowel syndrome)   . Interstitial cystitis   . OA (ocular albinism) ([place])   . Ovarian cancer ([place])   . Personal history of chemotherapy   . Stricture and stenosis of esophagus 2009   EGD   . UTI (lower urinary tract infection)    hx of       Patient Active Problem List   Diagnosis Date Noted  . Inguinal hernia bilateral, non-recurrent 05/25/2018  . Hiatal hernia 05/21/2018  . Aortic atherosclerosis ([place]) 05/21/2018  . Gastroesophageal reflux disease without esophagitis 12/31/2017  . Degenerative lumbar spinal stenosis 01/01/2016  . Primary osteoarthritis of both hips 01/01/2016  . Insomnia 07/17/2012  . Chest pain 06/23/2012  . Bronchiectasis ([place]) 03/02/2012  . Anemia 11/13/2011  . DIVERTICULOSIS-COLON 05/29/2009  . Vitamin D deficiency 10/11/2008  . B12 deficiency 10/04/2008  . IBS 10/01/2007  . Anxiety and depression 07/31/2007  . Essential hypertension 07/31/2007  . Chronic back pain 07/31/2007  . History of colonic polyps 07/31/2007    Past Surgical History:  Procedure Laterality Date  . ABDOMINAL HYSTERECTOMY  1962   ovarian cancer  . APPENDECTOMY    . breast biopsy- benign    . BREAST CYST EXCISION Left   . bunion/morton's neuroma  4/07   right foot   . carpal tunnel release-right    . CHOLECYSTECTOMY  1994  . colitis transfusion     hosp at 25  . COLONOSCOPY     very difficul- supp with BE past  . EGD tight ues sphincter  7/09   with dialtion  . FOOT SURGERY  10/09   TMT fusion  and bunionectomy  . HEMORRHOID SURGERY    . INCISIONAL HERNIA REPAIR     after ccy  . left knee surgery    . left shoulder replacement    . stress cardiolite  9/10  . VIDEO BRONCHOSCOPY  04/22/2012   Procedure: VIDEO BRONCHOSCOPY WITH FLUORO;  Surgeon: Juanito Doom, MD;  Location: [facility];  Service: Cardiopulmonary;  Laterality: Bilateral;    Prior to Admission medications   Medication Sig Start Date End Date Taking? Authorizing Provider  acetaminophen (TYLENOL) 500 MG tablet Take 500 mg by mouth every 6 (six) hours as needed.    [provider]  ALPRAZolam Duanne Moron) 0.25 MG tablet Take 0.25 mg by mouth at bedtime as needed for anxiety.    [provider]  amitriptyline (ELAVIL) 50 MG  tablet Take 50 mg by mouth at bedtime.    Lew Dawes, MD  bisoprolol (ZEBETA) 5 MG tablet Take 1 tablet (5 mg total) by mouth 2 (two) times daily. 05/21/18   Minna Merritts, MD  buPROPion (WELLBUTRIN SR) 150 MG 12 hr tablet Take 300 mg by mouth daily.     Lew Dawes, MD  Cholecalciferol (VITAMIN D3) 2000 units TABS Take by mouth.    [provider]  gabapentin (NEURONTIN) 100 MG capsule Take 200 mg by mouth daily. Take 1-3 capsules by mouth at bedtime for RLS  03/07/14   Lew Dawes, MD  guaiFENesin (ROBITUSSIN) 100 MG/5ML liquid Take 200 mg by mouth 3 (three) times daily as needed for cough.    [provider]  losartan (COZAAR) 50 MG tablet Take 1 tablet (50 mg total) by mouth daily. 01/11/19   Glean Hess, MD  pantoprazole (PROTONIX) 40 MG tablet Take 1 tablet (40 mg total) by mouth 2 (two) times daily. 05/25/18   Glean Hess, MD  vitamin B-12 (CYANOCOBALAMIN) 1000 MCG tablet Take 1,000 mcg by mouth daily.    [provider]    Allergies Patient has no known allergies.  Family History  Problem Relation Age of Onset  . Coronary artery disease Father   . Hypertension Father   . Heart disease Father        CAD  . Stroke Mother   . Hypertension Sister   . Hyperlipidemia Sister   . Ovarian cancer Maternal Grandmother   . Colon cancer Neg Hx   . Esophageal cancer Neg Hx   . Rectal cancer Neg Hx   . Stomach cancer Neg Hx   . Breast cancer Neg Hx     Social History Social History   Tobacco Use  . Smoking status: Never Smoker  . Smokeless tobacco: Never Used  . Tobacco comment: smoking cessation materials not required  Substance Use Topics  . Alcohol use: Yes    Comment: social wine   . Drug use: No    Review of Systems  Constitutional: No fever/chills Eyes: No visual changes. ENT: No sore throat. Cardiovascular: Denies chest pain. Respiratory: Denies shortness of breath. Gastrointestinal: No abdominal pain.  Positive  for nausea and vomiting.  No diarrhea.  No constipation. Genitourinary: Negative for dysuria. Musculoskeletal: Negative for back pain. Skin: Negative for rash. Neurological: Positive for headaches, negative for focal weakness or numbness.  ____________________________________________   PHYSICAL EXAM:  VITAL SIGNS: ED Triage Vitals  Enc Vitals Group     BP 01/28/19 1540 (!) 177/83     Pulse Rate 01/28/19 1540 79     Resp 01/28/19 1540 18  Temp 01/28/19 1540 98.3 F (36.8 C)     Temp Source 01/28/19 1540 Oral     SpO2 01/28/19 1540 97 %     Weight 01/28/19 1540 123 lb (55.8 kg)     Height 01/28/19 1540 5\' 2"  (1.575 m)     Head Circumference --      Peak Flow --      Pain Score 01/28/19 1546 10     Pain Loc --      Pain Edu? --      Excl. in [place]? --     Constitutional: Alert and oriented. Eyes: Conjunctivae are normal.  Pupils equal round and reactive to light bilaterally, extraocular movements intact. Head: Atraumatic. Nose: No congestion/rhinnorhea. Mouth/Throat: Mucous membranes are moist. Neck: Normal ROM Cardiovascular: Normal rate, regular rhythm. Grossly normal heart sounds. Respiratory: Normal respiratory effort.  No retractions. Lungs CTAB. Gastrointestinal: Soft and nontender. No distention. Genitourinary: deferred Musculoskeletal: No lower extremity tenderness nor edema. Neurologic:  Normal speech and language. No gross focal neurologic deficits are appreciated.  5-5 strength in bilateral upper and lower extremities, no pronator drift. Skin:  Skin is warm, dry and intact. No rash noted. Psychiatric: Mood and affect are normal. Speech and behavior are normal.  ____________________________________________   LABS (all labs ordered are listed, but only abnormal results are displayed)  Labs Reviewed  BASIC METABOLIC PANEL - Abnormal; Notable for the following components:      Result Value   Glucose, Bld 121 (*)    All other components within normal limits   CBC WITH DIFFERENTIAL/PLATELET - Abnormal; Notable for the following components:   RBC 3.81 (*)    All other components within normal limits  PROTIME-INR     PROCEDURES  Procedure(s) performed (including Critical Care):  Procedures   ____________________________________________   INITIAL IMPRESSION / ASSESSMENT AND PLAN / ED COURSE       83 year old female presents to the ED with initially mild headache that acutely worsened around 10:30 PM last night.  She describes it as diffuse but has not had any fevers or neck stiffness, do not suspect meningitis.  She has a benign and nonfocal neurologic exam.  CTA of her head and neck was ordered given description of headache as the worst in her life with acute worsening last night.  If this is negative for bleed or aneurysm, doubt SAH.  Doubt giant cell arteritis as patient has no jaw claudication or temporal tenderness.  Will treat with migraine cocktail and reassess.  Labs thus far unremarkable.  CTA negative for acute process, no evidence of hemorrhage or aneurysm.  Patient did not respond well to Compazine and Benadryl, but had improvement in her symptoms following dose of Reglan.  Counseled patient to use Tylenol as needed for headache and follow-up with PCP, otherwise return to the ED for worsening symptoms.  Patient agrees with plan.      ____________________________________________   FINAL CLINICAL IMPRESSION(S) / ED DIAGNOSES  Final diagnoses:  Acute nonintractable headache, unspecified headache type     ED Discharge Orders    None       Note:  This document was prepared using Dragon voice recognition software and may include unintentional dictation errors.   Blake Divine, MD 01/28/19 2130

## 2019-01-28 NOTE — ED Notes (Signed)
Patient transported to CT 

## 2019-01-28 NOTE — ED Provider Notes (Addendum)
[facility] ____________________________________________  Time seen: Approximately 2:58 PM  I have reviewed the triage vital signs and the nursing notes.   HISTORY  Chief Complaint Headache and Hypertension   HPI Jodi Bartlett is a 83 y.o. female presenting with daughter at bedside for evaluation of acute headache.  Patient reports she had a mild typical headache during the day yesterday, but around 1030 last night she had acute worsening of a headache that led to being severe.  Patient reports that she has tried multiple doses of Tylenol without any improvement.  Has vomited 3 times secondary to the headache.  No history of similar in the past.  Reports this is the worst headache of her life.  Also reports her blood pressure has been elevated during this time.  Some intermittent dizziness.  States her eyes feel strained.  Denies vision loss, confusion, paresthesias, unilateral weakness, unsteady gait.  Denies fall or head injury.  No recent fevers or sickness.  Denies other aggravating or relieving factors.  Glean Hess, MD: PCP   Past Medical History:  Diagnosis Date  . Abnormality of lung on CXR 01/21/2012   Noted on CXR 2013 - CT suggested a mass but follow up scans showed resolution Last CXR 10/2015 - mild apical scarring and mild COPD  . Allergy   . Anemia    hx of  . Anxiety   . Arthritis   . Back pain   . Blood transfusion without reported diagnosis   . Bunion 10/19/2007  . Depressed   . Diverticulosis 2010   Colonoscopy  . Dizziness    vertigo  . GERD (gastroesophageal reflux disease)   . Hiatal hernia    EGD   . HTN (hypertension)   . Hx of colonic polyps 2010   Colonoscopy  . IBS (irritable bowel syndrome)   . Interstitial cystitis   . OA (ocular albinism) ([place])   . Ovarian cancer ([place])   . Personal history of chemotherapy   . Stricture and stenosis of esophagus 2009   EGD   . UTI (lower urinary tract infection)    hx of      Patient  Active Problem List   Diagnosis Date Noted  . Inguinal hernia bilateral, non-recurrent 05/25/2018  . Hiatal hernia 05/21/2018  . Aortic atherosclerosis ([place]) 05/21/2018  . Gastroesophageal reflux disease without esophagitis 12/31/2017  . Degenerative lumbar spinal stenosis 01/01/2016  . Primary osteoarthritis of both hips 01/01/2016  . Insomnia 07/17/2012  . Chest pain 06/23/2012  . Bronchiectasis ([place]) 03/02/2012  . Anemia 11/13/2011  . DIVERTICULOSIS-COLON 05/29/2009  . Vitamin D deficiency 10/11/2008  . B12 deficiency 10/04/2008  . IBS 10/01/2007  . Anxiety and depression 07/31/2007  . Essential hypertension 07/31/2007  . Chronic back pain 07/31/2007  . History of colonic polyps 07/31/2007    Past Surgical History:  Procedure Laterality Date  . ABDOMINAL HYSTERECTOMY  1962   ovarian cancer  . APPENDECTOMY    . breast biopsy- benign    . BREAST CYST EXCISION Left   . bunion/morton's neuroma  4/07   right foot   . carpal tunnel release-right    . CHOLECYSTECTOMY  1994  . colitis transfusion     hosp at 25  . COLONOSCOPY     very difficul- supp with BE past  . EGD tight ues sphincter  7/09   with dialtion  . FOOT SURGERY  10/09   TMT fusion and bunionectomy  . HEMORRHOID SURGERY    .  INCISIONAL HERNIA REPAIR     after ccy  . left knee surgery    . left shoulder replacement    . stress cardiolite  9/10  . VIDEO BRONCHOSCOPY  04/22/2012   Procedure: VIDEO BRONCHOSCOPY WITH FLUORO;  Surgeon: Juanito Doom, MD;  Location: [facility];  Service: Cardiopulmonary;  Laterality: Bilateral;     No current facility-administered medications for this encounter.   Current Outpatient Medications:  .  acetaminophen (TYLENOL) 500 MG tablet, Take 500 mg by mouth every 6 (six) hours as needed., Disp: , Rfl:  .  ALPRAZolam (XANAX) 0.25 MG tablet, Take 0.25 mg by mouth at bedtime as needed for anxiety., Disp: , Rfl:  .  amitriptyline (ELAVIL) 50 MG tablet, Take 50 mg by mouth  at bedtime., Disp: , Rfl:  .  bisoprolol (ZEBETA) 5 MG tablet, Take 1 tablet (5 mg total) by mouth 2 (two) times daily., Disp: 180 tablet, Rfl: 3 .  buPROPion (WELLBUTRIN SR) 150 MG 12 hr tablet, Take 300 mg by mouth daily. , Disp: , Rfl:  .  Cholecalciferol (VITAMIN D3) 2000 units TABS, Take by mouth., Disp: , Rfl:  .  guaiFENesin (ROBITUSSIN) 100 MG/5ML liquid, Take 200 mg by mouth 3 (three) times daily as needed for cough., Disp: , Rfl:  .  losartan (COZAAR) 50 MG tablet, Take 1 tablet (50 mg total) by mouth daily., Disp: 90 tablet, Rfl: 0 .  pantoprazole (PROTONIX) 40 MG tablet, Take 1 tablet (40 mg total) by mouth 2 (two) times daily., Disp: 180 tablet, Rfl: 3 .  vitamin B-12 (CYANOCOBALAMIN) 1000 MCG tablet, Take 1,000 mcg by mouth daily., Disp: , Rfl:  .  budesonide-formoterol (SYMBICORT) 160-4.5 MCG/ACT inhaler, Inhale 2 puffs into the lungs 2 (two) times daily. (Patient not taking: Reported on 01/11/2019), Disp: 1 Inhaler, Rfl: 0 .  gabapentin (NEURONTIN) 100 MG capsule, Take 200 mg by mouth daily. Take 1-3 capsules by mouth at bedtime for RLS , Disp: , Rfl:   Allergies Patient has no known allergies.  Family History  Problem Relation Age of Onset  . Coronary artery disease Father   . Hypertension Father   . Heart disease Father        CAD  . Stroke Mother   . Hypertension Sister   . Hyperlipidemia Sister   . Ovarian cancer Maternal Grandmother   . Colon cancer Neg Hx   . Esophageal cancer Neg Hx   . Rectal cancer Neg Hx   . Stomach cancer Neg Hx   . Breast cancer Neg Hx     Social History Social History   Tobacco Use  . Smoking status: Never Smoker  . Smokeless tobacco: Never Used  . Tobacco comment: smoking cessation materials not required  Substance Use Topics  . Alcohol use: Yes    Comment: social wine   . Drug use: No    Review of Systems Constitutional: No fever/chills Eyes: No visual changes. "Eyes strained" ENT: No sore throat. Cardiovascular: Denies  chest pain. Respiratory: Denies shortness of breath. Gastrointestinal: No abdominal pain. Positive nausea and vomiting.  No diarrhea.  No constipation. Musculoskeletal: Negative for back pain. Skin: Negative for rash. Neurological: positive for headaches. Negative focal weakness or numbness.    ____________________________________________   PHYSICAL EXAM:  VITAL SIGNS: ED Triage Vitals  Enc Vitals Group     BP 01/28/19 1437 (!) 196/87     Pulse Rate 01/28/19 1437 96     Resp 01/28/19 1437 18  Temp 01/28/19 1437 98.7 F (37.1 C)     Temp Source 01/28/19 1437 Oral     SpO2 01/28/19 1437 98 %     Weight 01/28/19 1433 123 lb (55.8 kg)     Height 01/28/19 1433 5\' 2"  (1.575 m)     Head Circumference --      Peak Flow --      Pain Score 01/28/19 1433 10     Pain Loc --      Pain Edu? --      Excl. in [place]? --     Constitutional: Alert and oriented. Well appearing and in no acute distress. ENT      Head: Normocephalic      Nose: No congestion.      Mouth/Throat: Mucous membranes are moist.Oropharynx non-erythematous. Neck: No stridor. Supple without meningismus.  Hematological/Lymphatic/Immunilogical: No cervical lymphadenopathy. Cardiovascular: Normal rate, regular rhythm. Grossly normal heart sounds.  Good peripheral circulation. Respiratory: Normal respiratory effort without tachypnea nor retractions. Breath sounds are clear and equal bilaterally. No wheezes, rales, rhonchi. Gastrointestinal: Soft and nontender.  Musculoskeletal:  Steady gait.  Neurologic:  Normal speech and language. No gross focal neurologic deficits are appreciated. Speech is normal. No gait instability.  Skin:  Skin is warm, dry and intact. No rash noted. Psychiatric: Mood and affect are normal. Speech and behavior are normal. Patient exhibits appropriate insight and judgment   ___________________________________________   LABS (all labs ordered are listed, but only abnormal results are displayed)   Labs Reviewed - No data to display ____________________________________________  EKG  ED ECG REPORT I, Marylene Land, the attending provider, personally viewed and interpreted this ECG.   Date: 01/28/2019  EKG Time: 1503  Rate: 74  Rhythm:normal sinus rhythm  Axis: normal  Intervals:none  ST&T Change: no elevation   PROCEDURES Procedures   INITIAL IMPRESSION / ASSESSMENT AND PLAN / ED COURSE  Pertinent labs & imaging results that were available during my care of the patient were reviewed by me and considered in my medical decision making (see chart for details).  Patient alert and oriented.  Daughter at bedside.  No focal neurological deficits.  Worst headache of her life.  Discussed multiple differentials with patient and family.  Recommend further evaluation emergency room at this time.  4 mg odt Zofran given once in urgent care.  Patient declines EMS and states that her daughter will take her directly to [facility].  [place] RN triage nurse called and given report.  Patient agreed to plan. ____________________________________________   FINAL CLINICAL IMPRESSION(S) / ED DIAGNOSES  Final diagnoses:  Acute intractable headache, unspecified headache type     ED Discharge Orders    None       Note: This dictation was prepared with Dragon dictation along with smaller phrase technology. Any transcriptional errors that result from this process are unintentional.        Marylene Land, NP 01/28/19 1541

## 2019-01-28 NOTE — ED Triage Notes (Signed)
Pt c/o severe headache, worse headache of her life. She has vomited 3 times today. Her bp has been elevated at home 197/96. Headache started last night. Pain is in the frontal portion of her head. She has been taking tylenol. She has had some dizziness. Denies slurred speech, photosensitivity, confusion, weakness, or blurred vision. Denies head trauma.

## 2019-01-28 NOTE — ED Notes (Signed)
Spoke with MD Joni Fears, see orders

## 2019-01-28 NOTE — ED Notes (Signed)
See triage note  Presents with headache since last pm  States pain is at bilateral temporal area and top of head   States she took some tylenol w/o relief   This am states she did have some n/v   States this is the worse h/a of her life  Family at bedside

## 2019-01-28 NOTE — Telephone Encounter (Signed)
I am not sure why she has a headache but I suspect that the BP is only elevated because of it.  The vomiting and nausea are concerning unless she has a hx of migraine headaches. For the elevated BP, increase the losartan to 100 mg per day and make a follow up with me in about 2 weeks. If the n/v continue and the headache does not subside, she may need to go to the UC.

## 2019-01-28 NOTE — Telephone Encounter (Signed)
Headache started last night. Took extra strength tylenol with no help. Woke up this morning with headache still and vomited 3 times from feeling sick to her stomach. BP is 196/95.   CB cell phone because land line is down due to weather.  # [phone]

## 2019-01-28 NOTE — Telephone Encounter (Signed)
Patient informed. She sounded like she did not feel bad. She said she has no history of migraines and has never had a headache this bad. Told her if she feels this bad, she needs to go ahead to a UC and be evaluated ASAP.   She verbalized understanding and said she will go.

## 2019-01-28 NOTE — ED Notes (Signed)
conts to have h/a with nausea  Provider aware

## 2019-02-03 ENCOUNTER — Ambulatory Visit (INDEPENDENT_AMBULATORY_CARE_PROVIDER_SITE_OTHER): Payer: PPO | Admitting: Internal Medicine

## 2019-02-03 ENCOUNTER — Other Ambulatory Visit: Payer: Self-pay

## 2019-02-03 ENCOUNTER — Encounter: Payer: Self-pay | Admitting: Internal Medicine

## 2019-02-03 VITALS — BP 124/70 | HR 97 | Ht 62.0 in | Wt 129.0 lb

## 2019-02-03 DIAGNOSIS — Z23 Encounter for immunization: Secondary | ICD-10-CM | POA: Diagnosis not present

## 2019-02-03 DIAGNOSIS — J479 Bronchiectasis, uncomplicated: Secondary | ICD-10-CM | POA: Diagnosis not present

## 2019-02-03 DIAGNOSIS — I10 Essential (primary) hypertension: Secondary | ICD-10-CM

## 2019-02-03 DIAGNOSIS — Z0000 Encounter for general adult medical examination without abnormal findings: Secondary | ICD-10-CM | POA: Diagnosis not present

## 2019-02-03 DIAGNOSIS — K219 Gastro-esophageal reflux disease without esophagitis: Secondary | ICD-10-CM | POA: Diagnosis not present

## 2019-02-03 DIAGNOSIS — G43009 Migraine without aura, not intractable, without status migrainosus: Secondary | ICD-10-CM | POA: Diagnosis not present

## 2019-02-03 DIAGNOSIS — F329 Major depressive disorder, single episode, unspecified: Secondary | ICD-10-CM

## 2019-02-03 DIAGNOSIS — F419 Anxiety disorder, unspecified: Secondary | ICD-10-CM

## 2019-02-03 DIAGNOSIS — Z1231 Encounter for screening mammogram for malignant neoplasm of breast: Secondary | ICD-10-CM

## 2019-02-03 DIAGNOSIS — F5101 Primary insomnia: Secondary | ICD-10-CM | POA: Diagnosis not present

## 2019-02-03 NOTE — Progress Notes (Signed)
Date:  02/03/2019   Name:  Jodi Bartlett   DOB:  Dec 21, 1935   MRN:  [id number]   Chief Complaint: Annual Exam (Breast Exam. High dose flu shot. ) Jodi Bartlett is a 83 y.o. female who presents today for her Complete Annual Exam. She feels well. She reports exercising walking. She reports she is sleeping fairly well. She denies breast issues.  Mammogram  12/2015 Immunizations - UTD  Hypertension This is a chronic problem. The problem is controlled. Pertinent negatives include no chest pain, headaches (has mild headaches weekly), palpitations or shortness of breath. Past treatments include beta blockers (taking bisoprolol bid; stopped losarta because BP was too low). The current treatment provides significant improvement. There is no history of kidney disease, CAD/MI or CVA.  Gastroesophageal Reflux She reports no abdominal pain, no chest pain, no coughing or no wheezing. This is a recurrent problem. The problem occurs occasionally. Pertinent negatives include no fatigue. She has tried a PPI for the symptoms. The treatment provided moderate relief.  Migraine  This is a new problem. The current episode started in the past 7 days. The problem has been resolved (after trip to ED with negative work up.). The pain is located in the frontal region. Similar to prior headaches: no - completely new headache. Pertinent negatives include no abdominal pain, coughing, dizziness, fever, hearing loss, tinnitus or vomiting. Her past medical history is significant for hypertension. (Evaluated by Pulmonary - symbicort without benefit.  referred to Cardiology - stress test delayed due to covid)  Cough This is a chronic problem. The current episode started more than 1 month ago. The problem has been unchanged. The problem occurs hourly. The cough is non-productive. Pertinent negatives include no chest pain, chills, fever, headaches (has mild headaches weekly), rash, shortness of breath or wheezing. Nothing  aggravates the symptoms. Her past medical history is significant for bronchiectasis. evaluated by Pulmonary - symbicort without benefit.  referred to Cardiology - stress test delayed due to covid    Review of Systems  Constitutional: Negative for chills, fatigue and fever.  HENT: Negative for congestion, hearing loss, tinnitus, trouble swallowing and voice change.   Eyes: Negative for visual disturbance.  Respiratory: Negative for cough, chest tightness, shortness of breath and wheezing.   Cardiovascular: Negative for chest pain, palpitations and leg swelling.  Gastrointestinal: Negative for abdominal pain, constipation, diarrhea and vomiting.  Endocrine: Negative for polydipsia and polyuria.  Genitourinary: Negative for dysuria, frequency, genital sores, vaginal bleeding and vaginal discharge.  Musculoskeletal: Negative for arthralgias, gait problem and joint swelling.  Skin: Negative for color change and rash.  Neurological: Negative for dizziness, tremors, light-headedness and headaches (has mild headaches weekly).  Hematological: Negative for adenopathy. Does not bruise/bleed easily.  Psychiatric/Behavioral: Negative for dysphoric mood and sleep disturbance. The patient is not nervous/anxious.     Patient Active Problem List   Diagnosis Date Noted  . Inguinal hernia bilateral, non-recurrent 05/25/2018  . Hiatal hernia 05/21/2018  . Aortic atherosclerosis ([place]) 05/21/2018  . Gastroesophageal reflux disease without esophagitis 12/31/2017  . Degenerative lumbar spinal stenosis 01/01/2016  . Primary osteoarthritis of both hips 01/01/2016  . Insomnia 07/17/2012  . Chest pain 06/23/2012  . Bronchiectasis ([place]) 03/02/2012  . Anemia 11/13/2011  . DIVERTICULOSIS-COLON 05/29/2009  . Vitamin D deficiency 10/11/2008  . B12 deficiency 10/04/2008  . IBS 10/01/2007  . Anxiety and depression 07/31/2007  . Essential hypertension 07/31/2007  . Chronic back pain 07/31/2007  . History of  colonic polyps 07/31/2007  No Known Allergies  Past Surgical History:  Procedure Laterality Date  . ABDOMINAL HYSTERECTOMY  1962   ovarian cancer  . APPENDECTOMY    . breast biopsy- benign    . BREAST CYST EXCISION Left   . bunion/morton's neuroma  4/07   right foot   . carpal tunnel release-right    . CHOLECYSTECTOMY  1994  . colitis transfusion     hosp at 25  . COLONOSCOPY     very difficul- supp with BE past  . EGD tight ues sphincter  7/09   with dialtion  . FOOT SURGERY  10/09   TMT fusion and bunionectomy  . HEMORRHOID SURGERY    . INCISIONAL HERNIA REPAIR     after ccy  . left knee surgery    . left shoulder replacement    . stress cardiolite  9/10  . VIDEO BRONCHOSCOPY  04/22/2012   Procedure: VIDEO BRONCHOSCOPY WITH FLUORO;  Surgeon: Juanito Doom, MD;  Location: [facility];  Service: Cardiopulmonary;  Laterality: Bilateral;    Social History   Tobacco Use  . Smoking status: Never Smoker  . Smokeless tobacco: Never Used  . Tobacco comment: smoking cessation materials not required  Substance Use Topics  . Alcohol use: Yes    Comment: social wine   . Drug use: No     Medication list has been reviewed and updated.  Current Meds  Medication Sig  . ALPRAZolam (XANAX) 0.25 MG tablet Take 0.25 mg by mouth at bedtime as needed for anxiety.  Marland Kitchen amitriptyline (ELAVIL) 50 MG tablet Take 50 mg by mouth at bedtime.  . bisoprolol (ZEBETA) 5 MG tablet Take 1 tablet (5 mg total) by mouth 2 (two) times daily.  Marland Kitchen buPROPion (WELLBUTRIN SR) 150 MG 12 hr tablet Take 300 mg by mouth daily.   . Cholecalciferol (VITAMIN D3) 2000 units TABS Take by mouth.  . losartan (COZAAR) 50 MG tablet Take 1 tablet (50 mg total) by mouth daily.  . pantoprazole (PROTONIX) 40 MG tablet Take 1 tablet (40 mg total) by mouth 2 (two) times daily.  . vitamin B-12 (CYANOCOBALAMIN) 1000 MCG tablet Take 1,000 mcg by mouth daily.    PHQ 2/9 Scores 02/03/2019 01/11/2019 05/25/2018 05/18/2018   PHQ - 2 Score 0 0 0 0  PHQ- 9 Score 2 0 - -    BP Readings from Last 3 Encounters:  02/03/19 124/70  01/28/19 (!) 168/88  01/28/19 (!) 196/87    Physical Exam Vitals signs and nursing note reviewed.  Constitutional:      General: She is not in acute distress.    Appearance: She is well-developed.  HENT:     Head: Normocephalic and atraumatic.     Right Ear: Tympanic membrane and ear canal normal.     Left Ear: Tympanic membrane and ear canal normal.     Nose:     Right Sinus: No maxillary sinus tenderness.     Left Sinus: No maxillary sinus tenderness.  Eyes:     General: No scleral icterus.       Right eye: No discharge.        Left eye: No discharge.     Conjunctiva/sclera: Conjunctivae normal.  Neck:     Musculoskeletal: Normal range of motion. No erythema.     Thyroid: No thyromegaly.     Vascular: No carotid bruit.  Cardiovascular:     Rate and Rhythm: Normal rate and regular rhythm.     Pulses: Normal pulses.  Heart sounds: Normal heart sounds.  Pulmonary:     Effort: Pulmonary effort is normal. No respiratory distress.     Breath sounds: No wheezing.  Chest:     Breasts:        Right: No mass, nipple discharge, skin change or tenderness.        Left: No mass, nipple discharge, skin change or tenderness.  Abdominal:     General: Bowel sounds are normal.     Palpations: Abdomen is soft.     Tenderness: There is no abdominal tenderness.  Musculoskeletal:     Right lower leg: No edema.     Left lower leg: No edema.  Lymphadenopathy:     Cervical: No cervical adenopathy.  Skin:    General: Skin is warm and dry.     Capillary Refill: Capillary refill takes less than 2 seconds.     Findings: No rash.  Neurological:     General: No focal deficit present.     Mental Status: She is alert and oriented to person, place, and time.     Cranial Nerves: No cranial nerve deficit.     Sensory: No sensory deficit.     Deep Tendon Reflexes: Reflexes are normal and  symmetric.  Psychiatric:        Speech: Speech normal.        Behavior: Behavior normal.        Thought Content: Thought content normal.     Wt Readings from Last 3 Encounters:  02/03/19 129 lb (58.5 kg)  01/28/19 123 lb (55.8 kg)  01/28/19 123 lb (55.8 kg)    BP 124/70   Pulse 97   Ht 5\' 2"  (1.575 m)   Wt 129 lb (58.5 kg)   SpO2 98%   BMI 23.59 kg/m   Assessment and Plan: 1. Annual physical exam Normal exam Continue healthy diet and exercise - Lipid panel  2. Encounter for screening mammogram for breast cancer To be scheduled at [facility]; Future  3. Essential hypertension Clinically stable exam with well controlled BP.   Tolerating medications, bisoprolol 5 mg bid, without side effects at this time. Remain off of Losartan and monitor BP at home Pt to continue current regimen and low sodium diet; benefits of regular exercise as able discussed. - Comprehensive metabolic panel - TSH  4. Gastroesophageal reflux disease without esophagitis Symptoms well controlled on daily PPI No red flag signs such as weight loss, n/v, melena Will continue pantoprazole 40 mg bid.  5. Anxiety and depression Followed by psychiatry Clinically stable and doing well on Bupropion and Elavil.  6. Primary insomnia Treated by Psych with Elavil  7. Bronchiectasis without complication ([place]) Sample of Anoro given If cough continues, recommend she follow up with cardiology as planned previously  8. Need for immunization against influenza - Flu Vaccine QUAD High Dose(Fluad)  9. Migraine without aura and without status migrainosus, not intractable Recent severe headache appears to be migrainous Since she takes tylenol three times a day for back pain she could be having rebound HA as well Work up negative for IC process in ED If recurrent, she can try Excedrin Migraine + Xanax or consider specific migraine abortive therapy if needed   Partially dictated using  Editor, commissioning. Any errors are unintentional.  Halina Maidens, MD [facility]  02/03/2019

## 2019-02-03 NOTE — Patient Instructions (Signed)
For headache - try Excedrin Migraine with a Xanax (or Xanax and 2 Advil).

## 2019-02-04 LAB — COMPREHENSIVE METABOLIC PANEL
ALT: 13 IU/L (ref 0–32)
AST: 20 IU/L (ref 0–40)
Albumin/Globulin Ratio: 2 (ref 1.2–2.2)
Albumin: 4.7 g/dL — ABNORMAL HIGH (ref 3.6–4.6)
Alkaline Phosphatase: 74 IU/L (ref 39–117)
BUN/Creatinine Ratio: 17 (ref 12–28)
BUN: 17 mg/dL (ref 8–27)
Bilirubin Total: 0.4 mg/dL (ref 0.0–1.2)
CO2: 24 mmol/L (ref 20–29)
Calcium: 9.6 mg/dL (ref 8.7–10.3)
Chloride: 102 mmol/L (ref 96–106)
Creatinine, Ser: 1.02 mg/dL — ABNORMAL HIGH (ref 0.57–1.00)
GFR calc Af Amer: 60 mL/min/{1.73_m2} (ref >59)
GFR calc non Af Amer: 52 mL/min/{1.73_m2} — ABNORMAL LOW (ref >59)
Globulin, Total: 2.3 g/dL (ref 1.5–4.5)
Glucose: 89 mg/dL (ref 65–99)
Potassium: 4.2 mmol/L (ref 3.5–5.2)
Sodium: 141 mmol/L (ref 134–144)
Total Protein: 7 g/dL (ref 6.0–8.5)

## 2019-02-04 LAB — LIPID PANEL
Chol/HDL Ratio: 3.1 ratio (ref 0.0–4.4)
Cholesterol, Total: 171 mg/dL (ref 100–199)
HDL: 55 mg/dL (ref >39)
LDL Chol Calc (NIH): 97 mg/dL (ref 0–99)
Triglycerides: 106 mg/dL (ref 0–149)
VLDL Cholesterol Cal: 19 mg/dL (ref 5–40)

## 2019-02-04 LAB — TSH: TSH: 5.79 u[IU]/mL — ABNORMAL HIGH (ref 0.450–4.500)

## 2019-05-14 DIAGNOSIS — F329 Major depressive disorder, single episode, unspecified: Secondary | ICD-10-CM | POA: Diagnosis not present

## 2019-05-26 ENCOUNTER — Ambulatory Visit (INDEPENDENT_AMBULATORY_CARE_PROVIDER_SITE_OTHER): Payer: PPO

## 2019-05-26 VITALS — BP 148/75 | Ht 62.0 in | Wt 130.0 lb

## 2019-05-26 DIAGNOSIS — Z780 Asymptomatic menopausal state: Secondary | ICD-10-CM | POA: Diagnosis not present

## 2019-05-26 DIAGNOSIS — Z0000 Encounter for general adult medical examination without abnormal findings: Secondary | ICD-10-CM | POA: Diagnosis not present

## 2019-05-26 NOTE — Progress Notes (Signed)
Subjective:   Jodi Bartlett is a 84 y.o. female who presents for Medicare Annual (Subsequent) preventive examination.  Virtual Visit via Telephone Note  I connected with Jodi Bartlett on 05/26/19 at  2:00 PM EST by telephone and verified that I am speaking with the correct person using two identifiers.  Medicare Annual Wellness visit completed telephonically due to Covid-19 pandemic.  Location: Patient: home Provider: office   I discussed the limitations, risks, security and privacy concerns of performing an evaluation and management service by telephone and the availability of in person appointments. The patient expressed understanding and agreed to proceed.  Some vital signs may be absent or patient reported.   Clemetine Marker, LPN     Review of Systems:   Cardiac Risk Factors include: advanced age (>25men, >71 women);hypertension     Objective:     Vitals: BP (!) 148/75   Ht 5\' 2"  (1.575 m)   Wt 130 lb (59 kg)   BMI 23.78 kg/m   Body mass index is 23.78 kg/m.  Advanced Directives 05/26/2019 01/28/2019 05/18/2018 05/14/2017 05/13/2017 01/01/2016  Does Patient Have a Medical Advance Directive? Yes No No No No No  Type of Paramedic of [place];Living will - - - - -  Does patient want to make changes to medical advance directive? Yes (MAU/Ambulatory/Procedural Areas - Information given) - - - - -  Copy of [place] in Chart? No - copy requested - - - - -  Would patient like information on creating a medical advance directive? - - Yes (MAU/Ambulatory/Procedural Areas - Information given) (No Data) No - Patient declined -    Tobacco Social History   Tobacco Use  Smoking Status Never Smoker  Smokeless Tobacco Never Used  Tobacco Comment   smoking cessation materials not required     Counseling given: Not Answered Comment: smoking cessation materials not required   Clinical Intake:  Pre-visit preparation completed:  Yes  Pain : No/denies pain     BMI - recorded: 23.78 Nutritional Status: BMI of 19-24  Normal Nutritional Risks: None Diabetes: No  How often do you need to have someone help you when you read instructions, pamphlets, or other written materials from your doctor or pharmacy?: 1 - Never  Interpreter Needed?: No  Information entered by :: Clemetine Marker LPN  Past Medical History:  Diagnosis Date  . Abnormality of lung on CXR 01/21/2012   Noted on CXR 2013 - CT suggested a mass but follow up scans showed resolution Last CXR 10/2015 - mild apical scarring and mild COPD  . Allergy   . Anemia    hx of  . Anxiety   . Arthritis   . Back pain   . Blood transfusion without reported diagnosis   . Bunion 10/19/2007  . Depressed   . Diverticulosis 2010   Colonoscopy  . Dizziness    vertigo  . GERD (gastroesophageal reflux disease)   . Hiatal hernia    EGD   . HTN (hypertension)   . Hx of colonic polyps 2010   Colonoscopy  . IBS (irritable bowel syndrome)   . Interstitial cystitis   . OA (ocular albinism) ([place])   . Ovarian cancer ([place])   . Personal history of chemotherapy   . Stricture and stenosis of esophagus 2009   EGD   . UTI (lower urinary tract infection)    hx of     Past Surgical History:  Procedure Laterality Date  .  ABDOMINAL HYSTERECTOMY  1962   ovarian cancer  . APPENDECTOMY    . breast biopsy- benign    . BREAST CYST EXCISION Left   . bunion/morton's neuroma  4/07   right foot   . carpal tunnel release-right    . CHOLECYSTECTOMY  1994  . colitis transfusion     hosp at 25  . COLONOSCOPY     very difficul- supp with BE past  . EGD tight ues sphincter  7/09   with dialtion  . FOOT SURGERY  10/09   TMT fusion and bunionectomy  . HEMORRHOID SURGERY    . INCISIONAL HERNIA REPAIR     after ccy  . left knee surgery    . left shoulder replacement    . stress cardiolite  9/10  . VIDEO BRONCHOSCOPY  04/22/2012   Procedure: VIDEO BRONCHOSCOPY WITH FLUORO;   Surgeon: Juanito Doom, MD;  [facility]: [facility];  Service: Cardiopulmonary;  Laterality: Bilateral;   Family History  Problem Relation Age of Onset  . Coronary artery disease Father   . Hypertension Father   . Heart disease Father        CAD  . Stroke Mother   . Hypertension Sister   . Hyperlipidemia Sister   . Ovarian cancer Maternal Grandmother   . Colon cancer Neg Hx   . Esophageal cancer Neg Hx   . Rectal cancer Neg Hx   . Stomach cancer Neg Hx   . Breast cancer Neg Hx    Social History   Socioeconomic History  . Marital status: Widowed    Spouse name: Not on file  . Number of children: 1  . Years of education: Not on file  . Highest education level: Associate degree: occupational, Hotel manager, or vocational program  Occupational History  . Occupation: COSMETOLOGIST    Comment: Retired   Tobacco Use  . Smoking status: Never Smoker  . Smokeless tobacco: Never Used  . Tobacco comment: smoking cessation materials not required  Substance and Sexual Activity  . Alcohol use: Yes    Comment: social wine   . Drug use: No  . Sexual activity: Not Currently  Other Topics Concern  . Not on file  Social History Narrative   1 daughter. Self employed, retired Theme park manager. Daily caffeine. Lives alone.    Social Determinants of Health   Financial Resource Strain: Low Risk   . Difficulty of Paying Living Expenses: Not hard at all  Food Insecurity: No Food Insecurity  . Worried About Charity fundraiser in the Last Year: Never true  . Ran Out of Food in the Last Year: Never true  Transportation Needs: No Transportation Needs  . Lack of Transportation (Medical): No  . Lack of Transportation (Non-Medical): No  Physical Activity: Inactive  . Days of Exercise per Week: 0 days  . Minutes of Exercise per Session: 0 min  Stress: No Stress Concern Present  . Feeling of Stress : Only a little  Social Connections: Moderately Isolated  . Frequency of Communication with Friends  and Family: More than three times a week  . Frequency of Social Gatherings with Friends and Family: Three times a week  . Attends Religious Services: Never  . Active Member of Clubs or Organizations: No  . Attends Archivist Meetings: Never  . Marital Status: Widowed    Outpatient Encounter Medications as of 05/26/2019  Medication Sig  . acetaminophen (TYLENOL) 500 MG tablet Take 500 mg by mouth every 6 (six) hours  as needed.  . ALPRAZolam (XANAX) 0.25 MG tablet Take 0.25 mg by mouth at bedtime as needed for anxiety.  Marland Kitchen amitriptyline (ELAVIL) 50 MG tablet Take 50 mg by mouth at bedtime.  . bisoprolol (ZEBETA) 5 MG tablet Take 1 tablet (5 mg total) by mouth 2 (two) times daily.  Marland Kitchen buPROPion (WELLBUTRIN SR) 150 MG 12 hr tablet Take 300 mg by mouth daily.   . Cholecalciferol (VITAMIN D3) 2000 units TABS Take by mouth.  . pantoprazole (PROTONIX) 40 MG tablet Take 1 tablet (40 mg total) by mouth 2 (two) times daily.  . vitamin B-12 (CYANOCOBALAMIN) 1000 MCG tablet Take 1,000 mcg by mouth daily.   No facility-administered encounter medications on file as of 05/26/2019.    Activities of Daily Living In your present state of health, do you have any difficulty performing the following activities: 05/26/2019  Hearing? N  Comment declines hearing aids  Vision? N  Difficulty concentrating or making decisions? N  Walking or climbing stairs? N  Dressing or bathing? N  Doing errands, shopping? N  Preparing Food and eating ? N  Using the Toilet? N  In the past six months, have you accidently leaked urine? N  Do you have problems with loss of bowel control? N  Managing your Medications? N  Managing your Finances? N  Housekeeping or managing your Housekeeping? N  Some recent data might be hidden    Patient Care Team: Glean Hess, MD as PCP - General (Internal Medicine) Hessie Knows, MD as Consulting Physician (Orthopedic Surgery) Lucilla Lame, MD as Consulting Physician  (Gastroenterology) Renata Caprice as Physician Assistant (Orthopedic Surgery) Lew Dawes, MD as Attending Physician (Psychiatry) Sharlet Salina, MD as Referring Physician (Physical Medicine and Rehabilitation) Tyler Pita, MD as Consulting Physician (Pulmonary Disease) Minna Merritts, MD as Consulting Physician (Cardiology)    Assessment:   This is a routine wellness examination for Mylinh.  Exercise Activities and Dietary recommendations Current Exercise Habits: The patient does not participate in regular exercise at present, Exercise limited by: None identified  Goals    . Patient Stated     Patient wants to stay healthy        Fall Risk Fall Risk  05/26/2019 05/25/2018 05/18/2018 06/04/2017 05/21/2016  Falls in the past year? 0 0 0 Yes Yes  Number falls in past yr: 0 0 0 2 or more 1  Comment - - - - -  Injury with Fall? 0 0 0 Yes No  Risk Factor Category  - - - High Fall Risk -  Risk for fall due to : No Fall Risks History of fall(s) History of fall(s) History of fall(s) -  Follow up Falls prevention discussed Falls evaluation completed Falls prevention discussed Falls evaluation completed -   FALL RISK PREVENTION PERTAINING TO THE HOME:  Any stairs in or around the home? Yes  If so, do they handrails? Yes   Home free of loose throw rugs in walkways, pet beds, electrical cords, etc? Yes  Adequate lighting in your home to reduce risk of falls? Yes   ASSISTIVE DEVICES UTILIZED TO PREVENT FALLS:  Life alert? No  Use of a cane, walker or w/c? No  Grab bars in the bathroom? No  Shower chair or bench in shower? No  Elevated toilet seat or a handicapped toilet? No   DME ORDERS:  DME order needed?  No   TIMED UP AND GO:  Was the test performed? No . Telephonic visit.  Education: Fall risk prevention has been discussed.  Intervention(s) required? No   Depression Screen PHQ 2/9 Scores 05/26/2019 02/03/2019 01/11/2019 05/25/2018  PHQ - 2 Score 0 0  0 0  PHQ- 9 Score - 2 0 -     Cognitive Function - 6CIT deferred for 2021 AWV; pt has no memory issues.      6CIT Screen 05/18/2018 05/21/2016  What Year? 0 points 0 points  What month? 0 points 0 points  What time? 0 points 0 points  Count back from 20 0 points 0 points  Months in reverse 0 points 0 points  Repeat phrase 0 points 0 points  Total Score 0 0    Immunization History  Administered Date(s) Administered  . Fluad Quad(high Dose 65+) 02/03/2019  . Influenza Split 01/21/2012  . Influenza, High Dose Seasonal PF 12/31/2017  . Influenza,inj,Quad PF,6+ Mos 03/16/2014, 01/01/2016  . PFIZER SARS-COV-2 Vaccination 04/15/2019, 05/06/2019  . Pneumococcal Conjugate-13 05/21/2016  . Pneumococcal Polysaccharide-23 08/03/2013  . Td 04/01/1994, 11/18/2007    Qualifies for Shingles Vaccine? Yes . Due for Shingrix. Education has been provided regarding the importance of this vaccine. Pt has been advised to call insurance company to determine out of pocket expense. Advised may also receive vaccine at local pharmacy or Health Dept. Verbalized acceptance and understanding.  Tdap: Although this vaccine is not a covered service during a Wellness Exam, does the patient still wish to receive this vaccine today?  No .  Education has been provided regarding the importance of this vaccine. Advised may receive this vaccine at local pharmacy or Health Dept. Aware to provide a copy of the vaccination record if obtained from local pharmacy or Health Dept. Verbalized acceptance and understanding.  Flu Vaccine: Up to date   Pneumococcal Vaccine: Up to date   Screening Tests Health Maintenance  Topic Date Due  . MAMMOGRAM  01/29/2017  . TETANUS/TDAP  04/01/2020 (Originally 11/17/2017)  . INFLUENZA VACCINE  Completed  . DEXA SCAN  Completed  . PNA vac Low Risk Adult  Completed    Cancer Screenings:  Colorectal Screening:  No longer required.   Mammogram: Completed 01/30/16. Repeat every year.  Ordered 02/03/19. Pt provided with contact information and advised to call to schedule appt.   Bone Density: DUE. Ordered today. Pt provided with contact information and advised to call to schedule appt.   Lung Cancer Screening: (Low Dose CT Chest recommended if Age 56-80 years, 30 pack-year currently smoking OR have quit w/in 15years.) does not qualify.    Additional Screening:  Hepatitis C Screening: no longer required.   Vision Screening: Recommended annual ophthalmology exams for early detection of glaucoma and other disorders of the eye. Is the patient up to date with their annual eye exam?  No  - postponed Who is the provider or what is the name of the office in which the pt attends annual eye exams? [place] Screening: Recommended annual dental exams for proper oral hygiene  Community Resource Referral:  CRR required this visit?  No      Plan:     I have personally reviewed and addressed the Medicare Annual Wellness questionnaire and have noted the following in the patient's chart:  A. Medical and social history B. Use of alcohol, tobacco or illicit drugs  C. Current medications and supplements D. Functional ability and status E.  Nutritional status F.  Physical activity G. Advance directives H. List of other physicians I.  Hospitalizations, surgeries, and  ER visits in previous 12 months J.  [place] such as hearing and vision if needed, cognitive and depression L. Referrals and appointments   In addition, I have reviewed and discussed with patient certain preventive protocols, quality metrics, and best practice recommendations. A written personalized care plan for preventive services as well as general preventive health recommendations were provided to patient.   Signed,  Clemetine Marker, LPN Nurse Health Advisor   Nurse Notes: advised pt to monitor BP, wrist at home monitor today 148/75 which she states is normal for her.

## 2019-05-26 NOTE — Patient Instructions (Signed)
Jodi Bartlett , Thank you for taking time to come for your Medicare Wellness Visit. I appreciate your ongoing commitment to your health goals. Please review the following plan we discussed and let me know if I can assist you in the future.   Screening recommendations/referrals: Colonoscopy: no longer required.  Mammogram: done 2017. Please call [phone] to schedule your mammogram and bone density screening.  Bone Density: due Recommended yearly ophthalmology/optometry visit for glaucoma screening and checkup Recommended yearly dental visit for hygiene and checkup  Vaccinations: Influenza vaccine: done 02/03/19 Pneumococcal vaccine: done 05/21/16 Tdap vaccine: due Shingles vaccine: Shingrix discussed. Please contact your pharmacy for coverage information.   Advanced directives: Advance directive discussed with you today. I have provided a copy for you to complete at home and have notarized. Once this is complete please bring a copy in to our office so we can scan it into your chart.  Conditions/risks identified: Recommend increasing physical activity   Next appointment: Please follow up in one year for your Medicare Annual Wellness visit.     Preventive Care 82 Years and Older, Female Preventive care refers to lifestyle choices and visits with your health care provider that can promote health and wellness. What does preventive care include?  A yearly physical exam. This is also called an annual well check.  Dental exams once or twice a year.  Routine eye exams. Ask your health care provider how often you should have your eyes checked.  Personal lifestyle choices, including:  Daily care of your teeth and gums.  Regular physical activity.  Eating a healthy diet.  Avoiding tobacco and drug use.  Limiting alcohol use.  Practicing safe sex.  Taking low-dose aspirin every day.  Taking vitamin and mineral supplements as recommended by your health care provider. What happens  during an annual well check? The services and screenings done by your health care provider during your annual well check will depend on your age, overall health, lifestyle risk factors, and family history of disease. Counseling  Your health care provider may ask you questions about your:  Alcohol use.  Tobacco use.  Drug use.  Emotional well-being.  Home and relationship well-being.  Sexual activity.  Eating habits.  History of falls.  Memory and ability to understand (cognition).  Work and work Statistician.  Reproductive health. Screening  You may have the following tests or measurements:  Height, weight, and BMI.  Blood pressure.  Lipid and cholesterol levels. These may be checked every 5 years, or more frequently if you are over 72 years old.  Skin check.  Lung cancer screening. You may have this screening every year starting at age 82 if you have a 30-pack-year history of smoking and currently smoke or have quit within the past 15 years.  Fecal occult blood test (FOBT) of the stool. You may have this test every year starting at age 82.  Flexible sigmoidoscopy or colonoscopy. You may have a sigmoidoscopy every 5 years or a colonoscopy every 10 years starting at age 70.  Hepatitis C blood test.  Hepatitis B blood test.  Sexually transmitted disease (STD) testing.  Diabetes screening. This is done by checking your blood sugar (glucose) after you have not eaten for a while (fasting). You may have this done every 1-3 years.  Bone density scan. This is done to screen for osteoporosis. You may have this done starting at age 82.  Mammogram. This may be done every 1-2 years. Talk to your health care provider about how  often you should have regular mammograms. Talk with your health care provider about your test results, treatment options, and if necessary, the need for more tests. Vaccines  Your health care provider may recommend certain vaccines, such  as:  Influenza vaccine. This is recommended every year.  Tetanus, diphtheria, and acellular pertussis (Tdap, Td) vaccine. You may need a Td booster every 10 years.  Zoster vaccine. You may need this after age 82.  Pneumococcal 13-valent conjugate (PCV13) vaccine. One dose is recommended after age 82.  Pneumococcal polysaccharide (PPSV23) vaccine. One dose is recommended after age 82. Talk to your health care provider about which screenings and vaccines you need and how often you need them. This information is not intended to replace advice given to you by your health care provider. Make sure you discuss any questions you have with your health care provider. Document Released: 04/14/2015 Document Revised: 12/06/2015 Document Reviewed: 01/17/2015 Elsevier Interactive Patient Education  2017 [place] Prevention in the Home Falls can cause injuries. They can happen to people of all ages. There are many things you can do to make your home safe and to help prevent falls. What can I do on the outside of my home?  Regularly fix the edges of walkways and driveways and fix any cracks.  Remove anything that might make you trip as you walk through a door, such as a raised step or threshold.  Trim any bushes or trees on the path to your home.  Use bright outdoor lighting.  Clear any walking paths of anything that might make someone trip, such as rocks or tools.  Regularly check to see if handrails are loose or broken. Make sure that both sides of any steps have handrails.  Any raised decks and porches should have guardrails on the edges.  Have any leaves, snow, or ice cleared regularly.  Use sand or salt on walking paths during winter.  Clean up any spills in your garage right away. This includes oil or grease spills. What can I do in the bathroom?  Use night lights.  Install grab bars by the toilet and in the tub and shower. Do not use towel bars as grab bars.  Use  non-skid mats or decals in the tub or shower.  If you need to sit down in the shower, use a plastic, non-slip stool.  Keep the floor dry. Clean up any water that spills on the floor as soon as it happens.  Remove soap buildup in the tub or shower regularly.  Attach bath mats securely with double-sided non-slip rug tape.  Do not have throw rugs and other things on the floor that can make you trip. What can I do in the bedroom?  Use night lights.  Make sure that you have a light by your bed that is easy to reach.  Do not use any sheets or blankets that are too big for your bed. They should not hang down onto the floor.  Have a firm chair that has side arms. You can use this for support while you get dressed.  Do not have throw rugs and other things on the floor that can make you trip. What can I do in the kitchen?  Clean up any spills right away.  Avoid walking on wet floors.  Keep items that you use a lot in easy-to-reach places.  If you need to reach something above you, use a strong step stool that has a grab bar.  Keep electrical  cords out of the way.  Do not use floor polish or wax that makes floors slippery. If you must use wax, use non-skid floor wax.  Do not have throw rugs and other things on the floor that can make you trip. What can I do with my stairs?  Do not leave any items on the stairs.  Make sure that there are handrails on both sides of the stairs and use them. Fix handrails that are broken or loose. Make sure that handrails are as long as the stairways.  Check any carpeting to make sure that it is firmly attached to the stairs. Fix any carpet that is loose or worn.  Avoid having throw rugs at the top or bottom of the stairs. If you do have throw rugs, attach them to the floor with carpet tape.  Make sure that you have a light switch at the top of the stairs and the bottom of the stairs. If you do not have them, ask someone to add them for you. What  else can I do to help prevent falls?  Wear shoes that:  Do not have high heels.  Have rubber bottoms.  Are comfortable and fit you well.  Are closed at the toe. Do not wear sandals.  If you use a stepladder:  Make sure that it is fully opened. Do not climb a closed stepladder.  Make sure that both sides of the stepladder are locked into place.  Ask someone to hold it for you, if possible.  Clearly mark and make sure that you can see:  Any grab bars or handrails.  First and last steps.  Where the edge of each step is.  Use tools that help you move around (mobility aids) if they are needed. These include:  Canes.  Walkers.  Scooters.  Crutches.  Turn on the lights when you go into a dark area. Replace any light bulbs as soon as they burn out.  Set up your furniture so you have a clear path. Avoid moving your furniture around.  If any of your floors are uneven, fix them.  If there are any pets around you, be aware of where they are.  Review your medicines with your doctor. Some medicines can make you feel dizzy. This can increase your chance of falling. Ask your doctor what other things that you can do to help prevent falls. This information is not intended to replace advice given to you by your health care provider. Make sure you discuss any questions you have with your health care provider. Document Released: 01/12/2009 Document Revised: 08/24/2015 Document Reviewed: 04/22/2014 Elsevier Interactive Patient Education  2017 [employer].

## 2019-06-01 ENCOUNTER — Other Ambulatory Visit: Payer: Self-pay | Admitting: Cardiovascular Disease

## 2019-06-01 ENCOUNTER — Other Ambulatory Visit: Payer: Self-pay | Admitting: Internal Medicine

## 2019-06-01 DIAGNOSIS — K219 Gastro-esophageal reflux disease without esophagitis: Secondary | ICD-10-CM

## 2019-06-01 NOTE — Telephone Encounter (Signed)
Please schedule office visit for refills. Thank you! 

## 2019-06-01 NOTE — Telephone Encounter (Signed)
LVM for patient to schedule.

## 2019-06-03 NOTE — Telephone Encounter (Signed)
Attempted to schedule.  LMOV to call office.  ° °

## 2019-06-07 NOTE — Telephone Encounter (Signed)
Attempted to schedule no ans no vm  

## 2019-07-08 ENCOUNTER — Other Ambulatory Visit: Payer: Self-pay

## 2019-07-08 MED ORDER — BISOPROLOL FUMARATE 5 MG PO TABS: 5.0000 mg | ORAL_TABLET | Freq: Two times a day (BID) | ORAL | 5 refills | Status: DC

## 2019-07-28 NOTE — Telephone Encounter (Signed)
Unable to reach closing encounter 

## 2019-08-03 ENCOUNTER — Inpatient Hospital Stay: Admission: RE | Admit: 2019-08-03 | Payer: PPO | Source: Ambulatory Visit

## 2019-08-03 ENCOUNTER — Ambulatory Visit: Payer: PPO

## 2019-09-12 IMAGING — CT CT RENAL STONE PROTOCOL
2 of 4 series · 15 of 46 positions shown, 17 images · non-contrast
Comparison: CT abdomen and pelvis with contrast 12/10/2011.

CLINICAL DATA: 81-year-old with 5 day history of generalized
weakness, dizziness, lower abdominal pain and dysuria. Acute renal
insufficiency.

EXAM:
CT ABDOMEN AND PELVIS WITHOUT CONTRAST
TECHNIQUE: Multidetector CT imaging of the abdomen and pelvis was performed
following the standard protocol without IV contrast.

[Series 2: stone full standard · axial · 0.63mm/px · z∈[-797,-457]mm · 12 of 79 slices shown, 14 images]
[im 7/79  soft-tissue]
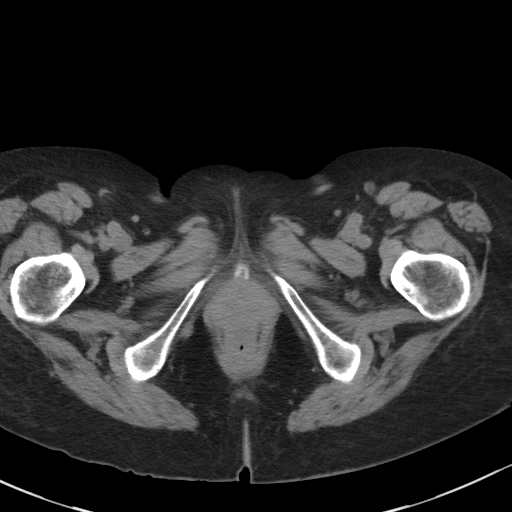
[im 7/79  bone]
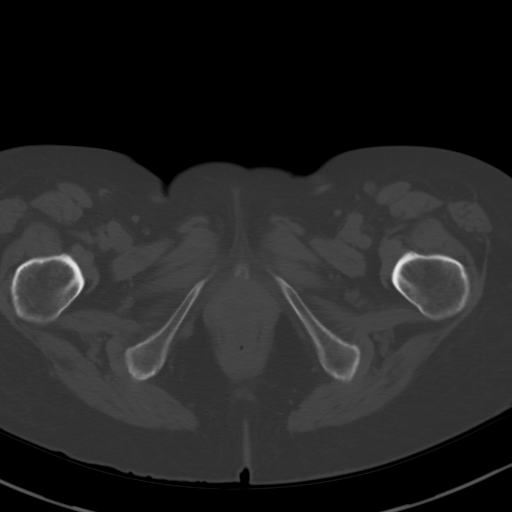
[im 13/79  soft-tissue]
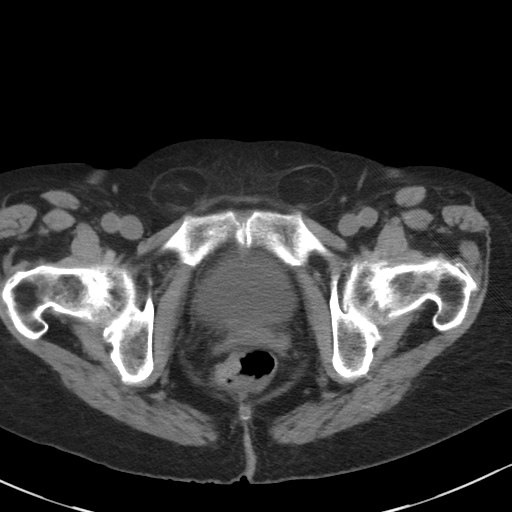
[im 19/79  soft-tissue]
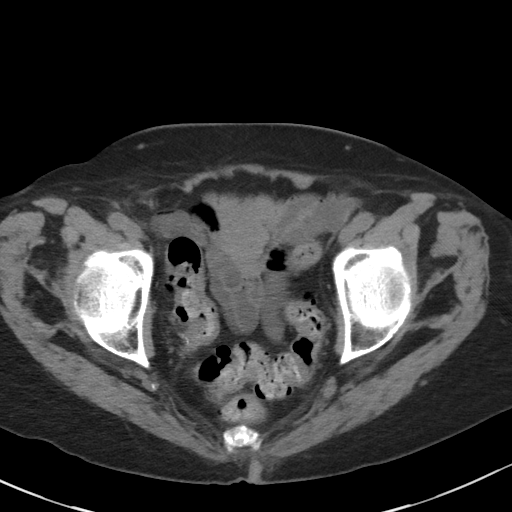
[im 25/79  soft-tissue]
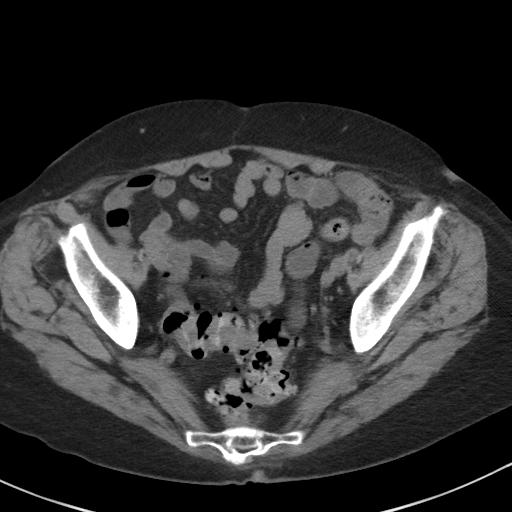
[im 32/79  soft-tissue]
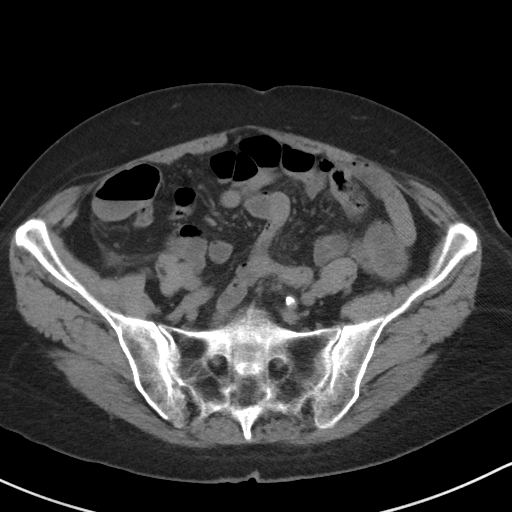
[im 38/79  soft-tissue]
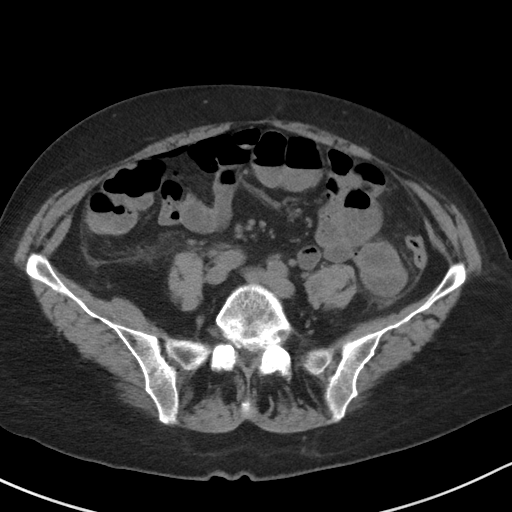
[im 44/79  soft-tissue]
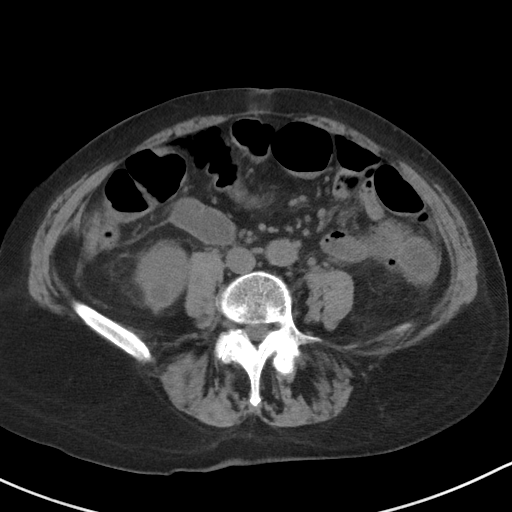
[im 50/79  soft-tissue]
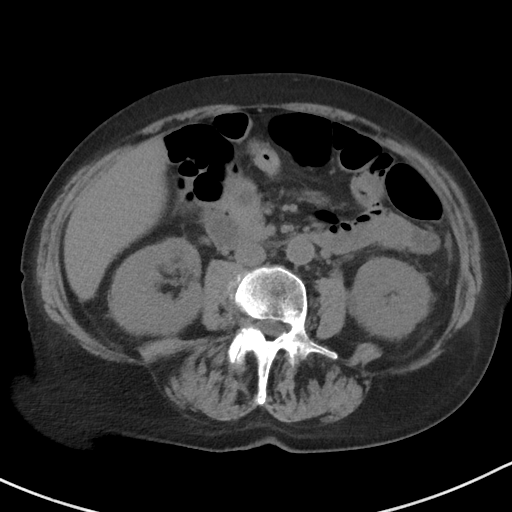
[im 57/79  soft-tissue]
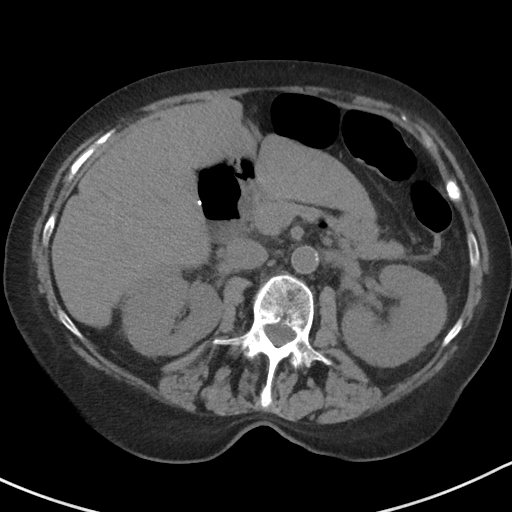
[im 57/79  bone]
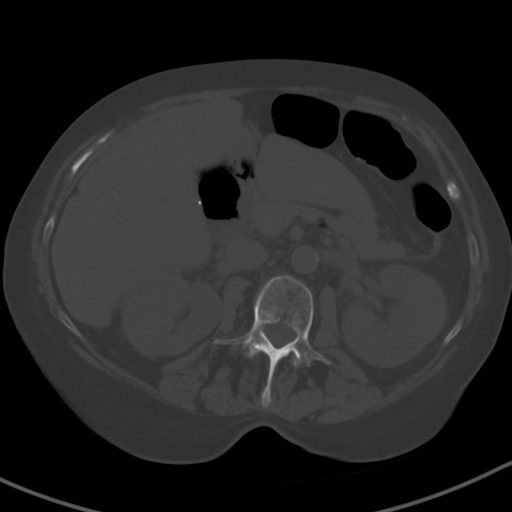
[im 63/79  soft-tissue]
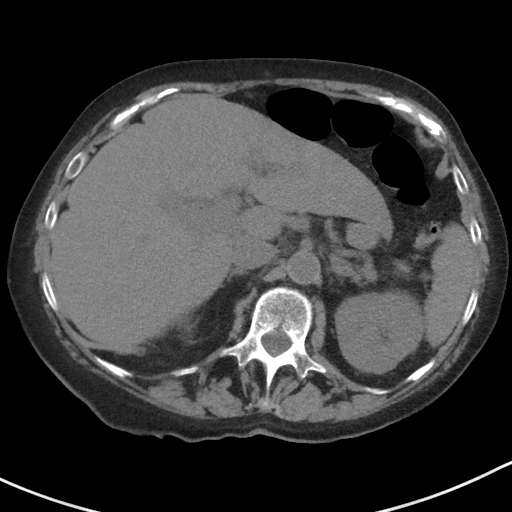
[im 69/79  soft-tissue]
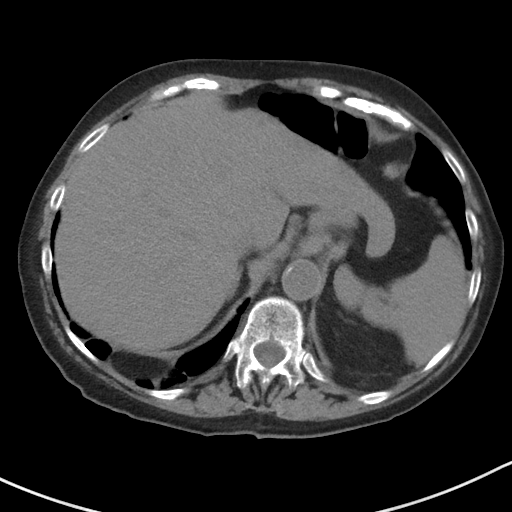
[im 75/79  soft-tissue]
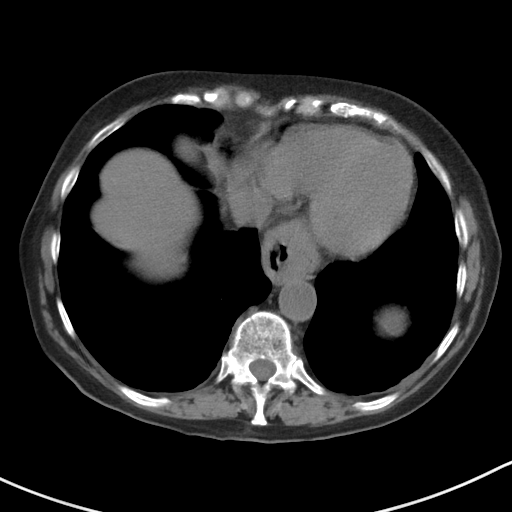

[Series 5: coronal · coronal · 0.70mm/px · 3 of 125 slices shown]
[im 42/125  soft-tissue]
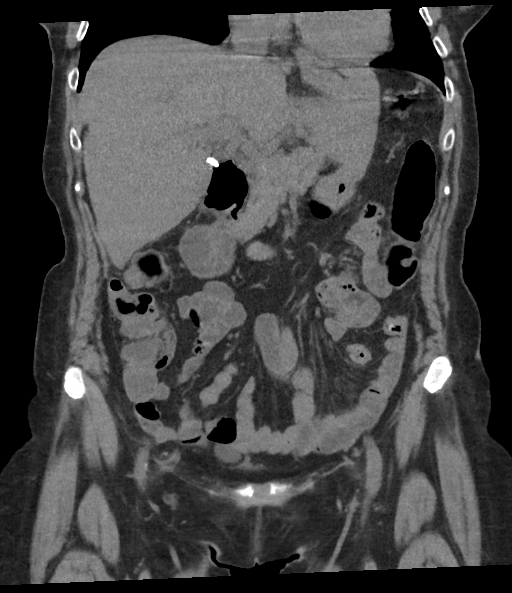
[im 56/125  soft-tissue]
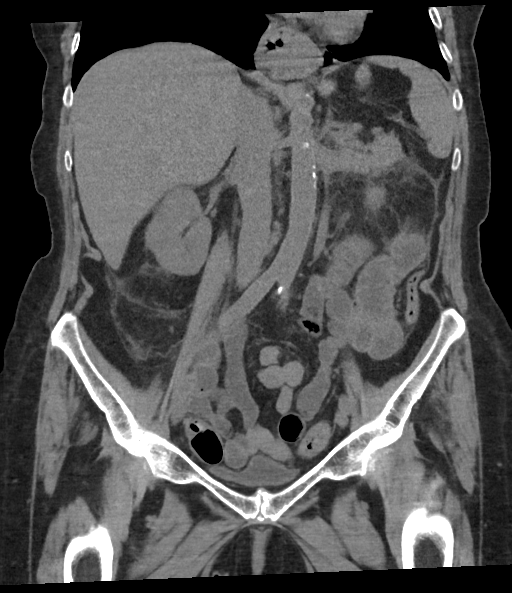
[im 69/125  soft-tissue]
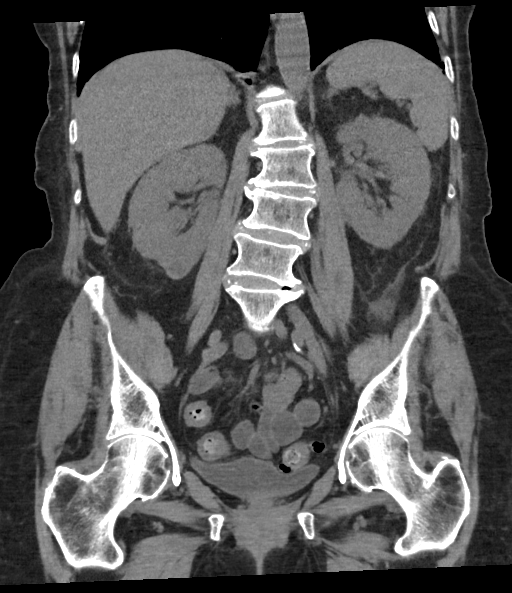

[15 of 46 positions shown; findings below may reference images not displayed]

FINDINGS: Lower chest: Mild parenchymal scarring in the lower lobes and right
middle lobe. Visualized lung bases otherwise clear. Normal heart
size.

Hepatobiliary: Liver upper normal in size with a normal unenhanced
appearance. Surgically absent gallbladder. No biliary ductal
dilation.

Pancreas: Normal unenhanced appearance.

Spleen: Normal unenhanced appearance.

Adrenals/Urinary Tract: Normal appearing adrenal glands.
Nonobstructing very small (2 mm) calculus in a mid calyx of the
right kidney. No urinary tract calculi elsewhere on either side.
Scarring involving the lower pole of the right kidney. Cortical
thickness otherwise well-preserved bilaterally. Approximate 1.3 cm
benign cyst arising from the lower pole of the right kidney.
Allowing for the unenhanced technique, no solid renal masses. No
hydronephrosis. Normal appearing decompressed urinary bladder.

Stomach/Bowel: Moderate-sized hiatal hernia, unchanged. Stomach
decompressed and otherwise normal in appearance. Normal-appearing
small bowel. Sigmoid colon diverticulosis without evidence of acute
diverticulitis. Appendix surgically absent.

Vascular/Lymphatic: Mild to moderate aorto-iliofemoral
atherosclerosis without evidence of aneurysm. No pathologic
lymphadenopathy.

Reproductive: Surgically absent uterus no adnexal masses.

Other: Edema involving the mesentery. Umbilical hernia containing
fat. Bilateral inguinal hernias containing fat.

Musculoskeletal: Osseous demineralization. Thoracolumbar
levoscoliosis. Multilevel degenerative disc disease, spondylosis and
facet degenerative changes throughout the lumbar spine.
Multifactorial spinal stenosis at L3-4, L4-5 and to a lesser degree
L5-S1. Degenerative changes involving the symphysis pubis. Mild
degenerative changes involving both hips. No acute abnormalities.
IMPRESSION: 1. Nonspecific mild edema involving the mesentery. Query mesenteric
venous congestion or mild mesenteritis.
2. No acute abnormalities otherwise involving the abdomen or pelvis.
3. Nonobstructing 2 mm calculus in a mid calyx of the right kidney.
No urinary tract calculi elsewhere on either side.
4. Scarring involving the lower pole the right kidney. Benign cyst
involving the lower pole the right kidney.
5. Moderate-sized hiatal hernia, stable since [DATE]. Aortic Atherosclerosis (NHDG5-170.0)
7. Skeletal findings as above, including multilevel mild-to-moderate
spinal stenosis.

## 2020-01-11 ENCOUNTER — Other Ambulatory Visit: Payer: Self-pay

## 2020-01-11 ENCOUNTER — Encounter: Payer: Self-pay | Admitting: Internal Medicine

## 2020-01-11 ENCOUNTER — Ambulatory Visit (INDEPENDENT_AMBULATORY_CARE_PROVIDER_SITE_OTHER): Payer: PPO | Admitting: Internal Medicine

## 2020-01-11 VITALS — BP 184/90 | HR 76 | Temp 98.2°F | Ht 62.0 in | Wt 134.0 lb

## 2020-01-11 DIAGNOSIS — K219 Gastro-esophageal reflux disease without esophagitis: Secondary | ICD-10-CM

## 2020-01-11 DIAGNOSIS — I10 Essential (primary) hypertension: Secondary | ICD-10-CM

## 2020-01-11 DIAGNOSIS — J479 Bronchiectasis, uncomplicated: Secondary | ICD-10-CM

## 2020-01-11 DIAGNOSIS — Z23 Encounter for immunization: Secondary | ICD-10-CM

## 2020-01-11 DIAGNOSIS — I700 Atherosclerosis of aorta: Secondary | ICD-10-CM

## 2020-01-11 MED ORDER — TRIAMTERENE-HCTZ 37.5-25 MG PO TABS: 1.0000 | ORAL_TABLET | Freq: Every day | ORAL | 0 refills | Status: DC

## 2020-01-11 MED ORDER — BISOPROLOL FUMARATE 5 MG PO TABS: 5.0000 mg | ORAL_TABLET | Freq: Two times a day (BID) | ORAL | 0 refills | Status: DC

## 2020-01-11 MED ORDER — PANTOPRAZOLE SODIUM 40 MG PO TBEC: 40.0000 mg | DELAYED_RELEASE_TABLET | Freq: Two times a day (BID) | ORAL | 1 refills | Status: DC

## 2020-01-11 NOTE — Progress Notes (Signed)
Date:  01/11/2020   Name:  Jodi Bartlett   DOB:  [birth year]   MRN:  [id number]   Chief Complaint: Hypertension (follow up ) and Flu Vaccine  Hypertension This is a chronic problem. The problem is uncontrolled (range up to 200 several months ago; most recent 178/84). Pertinent negatives include no chest pain, headaches, palpitations or shortness of breath. Past treatments include beta blockers. The current treatment provides significant improvement.  Gastroesophageal Reflux She complains of heartburn. She reports no abdominal pain, no chest pain, no coughing or no wheezing. This is a recurrent problem. The problem occurs rarely. Pertinent negatives include no fatigue. She has tried a PPI for the symptoms. The treatment provided significant relief.  Depression        This is a chronic (with anxiety followed by Psychiatry) problem.The problem is unchanged.  Associated symptoms include no fatigue and no headaches.   Lab Results  Component Value Date   CREATININE 1.02 (H) 02/03/2019   BUN 17 02/03/2019   NA 141 02/03/2019   K 4.2 02/03/2019   CL 102 02/03/2019   CO2 24 02/03/2019   Lab Results  Component Value Date   CHOL 171 02/03/2019   HDL 55 02/03/2019   LDLCALC 97 02/03/2019   TRIG 106 02/03/2019   CHOLHDL 3.1 02/03/2019   Lab Results  Component Value Date   TSH 5.790 (H) 02/03/2019   No results found for: HGBA1C Lab Results  Component Value Date   WBC 6.8 01/28/2019   HGB 12.3 01/28/2019   HCT 37.4 01/28/2019   MCV 98.2 01/28/2019   PLT 266 01/28/2019   Lab Results  Component Value Date   ALT 13 02/03/2019   AST 20 02/03/2019   ALKPHOS 74 02/03/2019   BILITOT 0.4 02/03/2019     Review of Systems  Constitutional: Negative for chills, fatigue and unexpected weight change.  HENT: Negative for trouble swallowing.   Eyes: Negative for visual disturbance.  Respiratory: Negative for cough, chest tightness, shortness of breath and wheezing.   Cardiovascular:  Negative for chest pain, palpitations and leg swelling.  Gastrointestinal: Positive for heartburn. Negative for abdominal pain, blood in stool, constipation and diarrhea.  Genitourinary: Negative for dysuria and hematuria.  Musculoskeletal: Negative for arthralgias.  Neurological: Negative for dizziness, light-headedness and headaches.  Psychiatric/Behavioral: Positive for depression. Negative for dysphoric mood and sleep disturbance.    Patient Active Problem List   Diagnosis Date Noted  . Migraine without aura and without status migrainosus, not intractable 02/03/2019  . Inguinal hernia bilateral, non-recurrent 05/25/2018  . Hiatal hernia 05/21/2018  . Aortic atherosclerosis ([place]) 05/21/2018  . Gastroesophageal reflux disease without esophagitis 12/31/2017  . Degenerative lumbar spinal stenosis 01/01/2016  . Primary osteoarthritis of both hips 01/01/2016  . Insomnia 07/17/2012  . Chest pain 06/23/2012  . Bronchiectasis ([place]) 03/02/2012  . Anemia 11/13/2011  . Vitamin D deficiency 10/11/2008  . B12 deficiency 10/04/2008  . IBS 10/01/2007  . Anxiety and depression 07/31/2007  . Essential hypertension 07/31/2007  . Chronic back pain 07/31/2007  . History of colonic polyps 07/31/2007    No Known Allergies  Past Surgical History:  Procedure Laterality Date  . ABDOMINAL HYSTERECTOMY  1962   ovarian cancer  . APPENDECTOMY    . breast biopsy- benign    . BREAST CYST EXCISION Left   . bunion/morton's neuroma  4/07   right foot   . carpal tunnel release-right    . CHOLECYSTECTOMY  1994  . colitis transfusion  hosp at 62  . COLONOSCOPY     very difficul- supp with BE past  . EGD tight ues sphincter  7/09   with dialtion  . FOOT SURGERY  10/09   TMT fusion and bunionectomy  . HEMORRHOID SURGERY    . INCISIONAL HERNIA REPAIR     after ccy  . left knee surgery    . left shoulder replacement    . stress cardiolite  9/10  . VIDEO BRONCHOSCOPY  04/22/2012   Procedure:  VIDEO BRONCHOSCOPY WITH FLUORO;  Surgeon: Juanito Doom, MD;  Location: [facility];  Service: Cardiopulmonary;  Laterality: Bilateral;    Social History   Tobacco Use  . Smoking status: Never Smoker  . Smokeless tobacco: Never Used  . Tobacco comment: smoking cessation materials not required  Vaping Use  . Vaping Use: Never used  Substance Use Topics  . Alcohol use: Yes    Comment: social wine   . Drug use: No     Medication list has been reviewed and updated.  Current Meds  Medication Sig  . acetaminophen (TYLENOL) 500 MG tablet Take 500 mg by mouth every 6 (six) hours as needed.  . ALPRAZolam (XANAX) 0.25 MG tablet Take 0.25 mg by mouth at bedtime as needed for anxiety.  Marland Kitchen amitriptyline (ELAVIL) 50 MG tablet Take 50 mg by mouth at bedtime.  . bisoprolol (ZEBETA) 5 MG tablet Take 1 tablet (5 mg total) by mouth 2 (two) times daily. Follow up in 6 months for more refills.  Marland Kitchen buPROPion (WELLBUTRIN SR) 150 MG 12 hr tablet Take 300 mg by mouth daily.   . Cholecalciferol (VITAMIN D3) 2000 units TABS Take by mouth.  . pantoprazole (PROTONIX) 40 MG tablet TAKE 1 TABLET BY MOUTH TWICE A DAY  . vitamin B-12 (CYANOCOBALAMIN) 1000 MCG tablet Take 1,000 mcg by mouth daily.    PHQ 2/9 Scores 01/11/2020 05/26/2019 02/03/2019 01/11/2019  PHQ - 2 Score 0 0 0 0  PHQ- 9 Score 0 - 2 0    GAD 7 : Generalized Anxiety Score 01/11/2020  Nervous, Anxious, on Edge 0  Control/stop worrying 0  Worry too much - different things 0  Trouble relaxing 0  Restless 0  Easily annoyed or irritable 0  Afraid - awful might happen 0  Total GAD 7 Score 0  Anxiety Difficulty Not difficult at all    BP Readings from Last 3 Encounters:  01/11/20 (!) 184/90  05/26/19 (!) 148/75  02/03/19 124/70    Physical Exam Vitals and nursing note reviewed.  Constitutional:      General: She is not in acute distress.    Appearance: She is well-developed.  HENT:     Head: Normocephalic and atraumatic.   Cardiovascular:     Rate and Rhythm: Normal rate and regular rhythm.     Pulses: Normal pulses.     Heart sounds: No murmur heard.   Pulmonary:     Effort: Pulmonary effort is normal. No respiratory distress.     Breath sounds: No wheezing or rhonchi.  Musculoskeletal:     Cervical back: Normal range of motion and neck supple.     Right lower leg: No edema.     Left lower leg: No edema.  Lymphadenopathy:     Cervical: No cervical adenopathy.  Skin:    General: Skin is warm and dry.     Capillary Refill: Capillary refill takes less than 2 seconds.     Findings: No rash.  Neurological:  General: No focal deficit present.     Mental Status: She is alert and oriented to person, place, and time.  Psychiatric:        Mood and Affect: Mood normal.        Behavior: Behavior normal.        Thought Content: Thought content normal.     Wt Readings from Last 3 Encounters:  01/11/20 134 lb (60.8 kg)  05/26/19 130 lb (59 kg)  02/03/19 129 lb (58.5 kg)    BP (!) 184/90 (BP Location: Right Arm, Patient Position: Sitting)   Pulse 76   Temp 98.2 F (36.8 C) (Oral)   Ht 5\' 2"  (1.575 m)   Wt 134 lb (60.8 kg)   SpO2 98%   BMI 24.51 kg/m   Assessment and Plan: 1. Essential hypertension Not controlled for some time No obvious changes in lifestyle or medication to contribute Resume maxzide daily Recheck in one month Labs today - triamterene-hydrochlorothiazide (MAXZIDE-25) 37.5-25 MG tablet; Take 1 tablet by mouth daily.  Dispense: 90 tablet; Refill: 0 - bisoprolol (ZEBETA) 5 MG tablet; Take 1 tablet (5 mg total) by mouth 2 (two) times daily. Follow up in 6 months for more refills.  Dispense: 180 tablet; Refill: 0 - TSH + free T4 - Comprehensive metabolic panel  2. Gastroesophageal reflux disease without esophagitis Symptoms well controlled on daily PPI No red flag signs such as weight loss, n/v, melena Will continue pantoprazole bid. - pantoprazole (PROTONIX) 40 MG tablet;  Take 1 tablet (40 mg total) by mouth 2 (two) times daily.  Dispense: 180 tablet; Refill: 1 - CBC with Differential/Platelet  3. Aortic atherosclerosis ([facility]) Not on statin medication due to age and normal lipids Consider daily aspirin  4. Bronchiectasis without complication ([place]) Not using symbicort due to lack of benefit Symptoms are much improved at this time and she has not followed up with Pulmonary   Partially dictated using Dragon software. Any errors are unintentional.  Halina Maidens, MD [facility]  01/11/2020

## 2020-01-12 ENCOUNTER — Encounter: Payer: Self-pay | Admitting: Internal Medicine

## 2020-01-12 DIAGNOSIS — N183 Chronic kidney disease, stage 3 unspecified: Secondary | ICD-10-CM | POA: Insufficient documentation

## 2020-01-12 DIAGNOSIS — N1831 Chronic kidney disease, stage 3a: Secondary | ICD-10-CM | POA: Insufficient documentation

## 2020-01-12 DIAGNOSIS — N1832 Chronic kidney disease, stage 3b: Secondary | ICD-10-CM | POA: Insufficient documentation

## 2020-01-12 LAB — COMPREHENSIVE METABOLIC PANEL
ALT: 14 IU/L (ref 0–32)
AST: 16 IU/L (ref 0–40)
Albumin/Globulin Ratio: 2.1 (ref 1.2–2.2)
Albumin: 4.8 g/dL — ABNORMAL HIGH (ref 3.6–4.6)
Alkaline Phosphatase: 77 IU/L (ref 44–121)
BUN/Creatinine Ratio: 15 (ref 12–28)
BUN: 17 mg/dL (ref 8–27)
Bilirubin Total: 0.3 mg/dL (ref 0.0–1.2)
CO2: 26 mmol/L (ref 20–29)
Calcium: 9.8 mg/dL (ref 8.7–10.3)
Chloride: 101 mmol/L (ref 96–106)
Creatinine, Ser: 1.17 mg/dL — ABNORMAL HIGH (ref 0.57–1.00)
GFR calc Af Amer: 50 mL/min/{1.73_m2} — ABNORMAL LOW (ref >59)
GFR calc non Af Amer: 44 mL/min/{1.73_m2} — ABNORMAL LOW (ref >59)
Globulin, Total: 2.3 g/dL (ref 1.5–4.5)
Glucose: 105 mg/dL — ABNORMAL HIGH (ref 65–99)
Potassium: 4.6 mmol/L (ref 3.5–5.2)
Sodium: 140 mmol/L (ref 134–144)
Total Protein: 7.1 g/dL (ref 6.0–8.5)

## 2020-01-12 LAB — CBC WITH DIFFERENTIAL/PLATELET
Basophils Absolute: 0.1 10*3/uL (ref 0.0–0.2)
Basos: 1 %
EOS (ABSOLUTE): 0.3 10*3/uL (ref 0.0–0.4)
Eos: 4 %
Hematocrit: 36.3 % (ref 34.0–46.6)
Hemoglobin: 12 g/dL (ref 11.1–15.9)
Immature Grans (Abs): 0 10*3/uL (ref 0.0–0.1)
Immature Granulocytes: 0 %
Lymphocytes Absolute: 1.9 10*3/uL (ref 0.7–3.1)
Lymphs: 26 %
MCH: 31.7 pg (ref 26.6–33.0)
MCHC: 33.1 g/dL (ref 31.5–35.7)
MCV: 96 fL (ref 79–97)
Monocytes Absolute: 0.5 10*3/uL (ref 0.1–0.9)
Monocytes: 7 %
Neutrophils Absolute: 4.4 10*3/uL (ref 1.4–7.0)
Neutrophils: 62 %
Platelets: 299 10*3/uL (ref 150–450)
RBC: 3.78 x10E6/uL (ref 3.77–5.28)
RDW: 12.5 % (ref 11.7–15.4)
WBC: 7.1 10*3/uL (ref 3.4–10.8)

## 2020-01-12 LAB — TSH+FREE T4
Free T4: 1.08 ng/dL (ref 0.82–1.77)
TSH: 5.51 u[IU]/mL — ABNORMAL HIGH (ref 0.450–4.500)

## 2020-01-14 DIAGNOSIS — F329 Major depressive disorder, single episode, unspecified: Secondary | ICD-10-CM | POA: Diagnosis not present

## 2020-01-17 ENCOUNTER — Ambulatory Visit: Payer: PPO | Attending: Internal Medicine

## 2020-01-17 DIAGNOSIS — Z23 Encounter for immunization: Secondary | ICD-10-CM

## 2020-01-17 NOTE — Progress Notes (Signed)
   Covid-19 Vaccination Clinic  Name:  Jodi Bartlett    MRN: [id number] DOB: 1935/04/24  01/17/2020  [name] was observed post Covid-19 immunization for 15 minutes without incident. She was provided with Vaccine Information Sheet and instruction to access the V-Safe system.   [name] was instructed to call 911 with any severe reactions post vaccine: Marland Kitchen Difficulty breathing  . Swelling of face and throat  . A fast heartbeat  . A bad rash all over body  . Dizziness and weakness

## 2020-02-09 ENCOUNTER — Telehealth: Payer: Self-pay | Admitting: *Deleted

## 2020-02-09 NOTE — Chronic Care Management (AMB) (Signed)
  Chronic Care Management   Outreach Note  02/09/2020 Name: [name] MRN: [id number] DOB: 11/30/1935  Jodi Bartlett is a 84 y.o. year old female who is a primary care patient of [name], MD. I reached out to Jodi Bartlett by phone today in response to a referral sent by Ms. Ulice Brilliant [employer]iester's health plan.     An unsuccessful telephone outreach was attempted today. The patient was referred to the case management team for assistance with care management and care coordination.   Follow Up Plan: A HIPAA compliant phone message was left for the patient providing contact information and requesting a return call. The care management team will reach out to the patient again over the next 7 days. If patient returns call to provider office, please advise to call [place] at [phone].  [place] [facility]  Direct Dial: [phone]

## 2020-02-11 ENCOUNTER — Other Ambulatory Visit: Payer: Self-pay

## 2020-02-11 ENCOUNTER — Encounter: Payer: Self-pay | Admitting: Internal Medicine

## 2020-02-11 ENCOUNTER — Ambulatory Visit (INDEPENDENT_AMBULATORY_CARE_PROVIDER_SITE_OTHER): Payer: PPO | Admitting: Internal Medicine

## 2020-02-11 VITALS — BP 138/80 | HR 73 | Temp 98.6°F | Ht 62.0 in | Wt 135.0 lb

## 2020-02-11 DIAGNOSIS — I10 Essential (primary) hypertension: Secondary | ICD-10-CM

## 2020-02-11 NOTE — Progress Notes (Signed)
Date:  02/11/2020   Name:  Jodi Bartlett   DOB:  1936/03/01   MRN:  [id number]   Chief Complaint: Hypertension (f/u)  Hypertension This is a chronic problem. The problem is controlled. Pertinent negatives include no chest pain, headaches, palpitations or shortness of breath. Past treatments include beta blockers and diuretics (diuretic resumed last month). The current treatment provides significant improvement.  CKD - stage 3a - discussed mediation avoidance - esp nsaids.  She takes only tylenol as needed.  She feels that she is well hydrated.  Lab Results  Component Value Date   CREATININE 1.17 (H) 01/11/2020   BUN 17 01/11/2020   NA 140 01/11/2020   K 4.6 01/11/2020   CL 101 01/11/2020   CO2 26 01/11/2020   Lab Results  Component Value Date   CHOL 171 02/03/2019   HDL 55 02/03/2019   LDLCALC 97 02/03/2019   TRIG 106 02/03/2019   CHOLHDL 3.1 02/03/2019   Lab Results  Component Value Date   TSH 5.510 (H) 01/11/2020   No results found for: HGBA1C Lab Results  Component Value Date   WBC 7.1 01/11/2020   HGB 12.0 01/11/2020   HCT 36.3 01/11/2020   MCV 96 01/11/2020   PLT 299 01/11/2020   Lab Results  Component Value Date   ALT 14 01/11/2020   AST 16 01/11/2020   ALKPHOS 77 01/11/2020   BILITOT 0.3 01/11/2020     Review of Systems  Constitutional: Negative for appetite change, fatigue, fever and unexpected weight change.  HENT: Negative for tinnitus and trouble swallowing.   Eyes: Negative for visual disturbance.  Respiratory: Negative for cough, chest tightness and shortness of breath.   Cardiovascular: Negative for chest pain, palpitations and leg swelling.  Gastrointestinal: Negative for abdominal pain.  Genitourinary: Negative for dysuria and hematuria.  Musculoskeletal: Negative for arthralgias.  Neurological: Negative for tremors, numbness and headaches.  Psychiatric/Behavioral: Negative for dysphoric mood.    Patient Active Problem List    Diagnosis Date Noted  . Chronic kidney disease, stage 3a ([place]) 01/12/2020  . Migraine without aura and without status migrainosus, not intractable 02/03/2019  . Inguinal hernia bilateral, non-recurrent 05/25/2018  . Hiatal hernia 05/21/2018  . Aortic atherosclerosis ([place]) 05/21/2018  . Gastroesophageal reflux disease without esophagitis 12/31/2017  . Degenerative lumbar spinal stenosis 01/01/2016  . Primary osteoarthritis of both hips 01/01/2016  . Insomnia 07/17/2012  . Chest pain 06/23/2012  . Bronchiectasis ([place]) 03/02/2012  . Anemia 11/13/2011  . Vitamin D deficiency 10/11/2008  . B12 deficiency 10/04/2008  . IBS 10/01/2007  . Anxiety and depression 07/31/2007  . Essential hypertension 07/31/2007  . Chronic back pain 07/31/2007  . History of colonic polyps 07/31/2007    No Known Allergies  Past Surgical History:  Procedure Laterality Date  . ABDOMINAL HYSTERECTOMY  1962   ovarian cancer  . APPENDECTOMY    . breast biopsy- benign    . BREAST CYST EXCISION Left   . bunion/morton's neuroma  4/07   right foot   . carpal tunnel release-right    . CHOLECYSTECTOMY  1994  . colitis transfusion     hosp at 25  . COLONOSCOPY     very difficul- supp with BE past  . EGD tight ues sphincter  7/09   with dialtion  . FOOT SURGERY  10/09   TMT fusion and bunionectomy  . HEMORRHOID SURGERY    . INCISIONAL HERNIA REPAIR     after [facility]  . left  knee surgery    . left shoulder replacement    . stress cardiolite  9/10  . VIDEO BRONCHOSCOPY  04/22/2012   Procedure: VIDEO BRONCHOSCOPY WITH FLUORO;  Surgeon: Juanito Doom, MD;  Location: [facility];  Service: Cardiopulmonary;  Laterality: Bilateral;    Social History   Tobacco Use  . Smoking status: Never Smoker  . Smokeless tobacco: Never Used  . Tobacco comment: smoking cessation materials not required  Vaping Use  . Vaping Use: Never used  Substance Use Topics  . Alcohol use: Yes    Comment: social wine   . Drug use:  No     Medication list has been reviewed and updated.  Current Meds  Medication Sig  . acetaminophen (TYLENOL) 500 MG tablet Take 500 mg by mouth every 6 (six) hours as needed.  . ALPRAZolam (XANAX) 0.25 MG tablet Take 0.25 mg by mouth at bedtime as needed for anxiety.  Marland Kitchen amitriptyline (ELAVIL) 50 MG tablet Take 50 mg by mouth at bedtime.  . bisoprolol (ZEBETA) 5 MG tablet Take 1 tablet (5 mg total) by mouth 2 (two) times daily. Follow up in 6 months for more refills.  Marland Kitchen buPROPion (WELLBUTRIN SR) 150 MG 12 hr tablet Take 300 mg by mouth daily.   . Cholecalciferol (VITAMIN D3) 2000 units TABS Take by mouth.  . pantoprazole (PROTONIX) 40 MG tablet Take 1 tablet (40 mg total) by mouth 2 (two) times daily.  Marland Kitchen triamterene-hydrochlorothiazide (MAXZIDE-25) 37.5-25 MG tablet Take 1 tablet by mouth daily.  . vitamin B-12 (CYANOCOBALAMIN) 1000 MCG tablet Take 1,000 mcg by mouth daily.    PHQ 2/9 Scores 02/11/2020 01/11/2020 05/26/2019 02/03/2019  PHQ - 2 Score 0 0 0 0  PHQ- 9 Score 0 0 - 2    GAD 7 : Generalized Anxiety Score 02/11/2020 01/11/2020  Nervous, Anxious, on Edge 0 0  Control/stop worrying 0 0  Worry too much - different things 0 0  Trouble relaxing 0 0  Restless 0 0  Easily annoyed or irritable 0 0  Afraid - awful might happen 0 0  Total GAD 7 Score 0 0  Anxiety Difficulty - Not difficult at all    BP Readings from Last 3 Encounters:  02/11/20 138/80  01/11/20 (!) 184/90  05/26/19 (!) 148/75    Physical Exam Vitals and nursing note reviewed.  Constitutional:      General: She is not in acute distress.    Appearance: Normal appearance. She is well-developed.  HENT:     Head: Normocephalic and atraumatic.  Cardiovascular:     Rate and Rhythm: Normal rate and regular rhythm.     Pulses: Normal pulses.     Heart sounds: No murmur heard.   Pulmonary:     Effort: Pulmonary effort is normal. No respiratory distress.     Breath sounds: No wheezing or rhonchi.   Musculoskeletal:        General: Normal range of motion.  Lymphadenopathy:     Cervical: No cervical adenopathy.  Skin:    General: Skin is warm and dry.     Findings: No rash.  Neurological:     General: No focal deficit present.     Mental Status: She is alert and oriented to person, place, and time.     Wt Readings from Last 3 Encounters:  02/11/20 135 lb (61.2 kg)  01/11/20 134 lb (60.8 kg)  05/26/19 130 lb (59 kg)    BP 138/80   Pulse 73  Temp 98.6 F (37 C) (Oral)   Ht 5\' 2"  (1.575 m)   Wt 135 lb (61.2 kg)   SpO2 98%   BMI 24.69 kg/m   Assessment and Plan: 1. Essential hypertension BP is much improved with addition of Maxzide. Continue to monitor at home. No change in regimen at this time. Schedule annual exam with labs in 3 months.  Partially dictated using Editor, commissioning. Any errors are unintentional.  Halina Maidens, MD [facility]  02/11/2020

## 2020-02-16 NOTE — Chronic Care Management (AMB) (Signed)
  Chronic Care Management   Outreach Note  02/16/2020 Name: Jodi Bartlett MRN: [id number] DOB: 1935/04/20  Jodi Bartlett is a 84 y.o. year old female who is a primary care patient of [name], MD. I reached out to Jodi Bartlett by phone today in response to a referral sent by Ms. Ulice Brilliant Caraher's health plan.     A second unsuccessful telephone outreach was attempted today. The patient was referred to the case management team for assistance with care management and care coordination.   Follow Up Plan: A HIPAA compliant phone message was left for the patient providing contact information and requesting a return call. The care management team will reach out to the patient again over the next 7 days. If patient returns call to provider office, please advise to call [place] at [phone].  [place] Management  Direct Dial: [phone]

## 2020-02-28 NOTE — Chronic Care Management (AMB) (Signed)
  Chronic Care Management   Outreach Note  02/28/2020 Name: Jodi Bartlett MRN: [id number] DOB: 02-04-1936  Jodi Bartlett is a 84 y.o. year old female who is a primary care patient of [name], MD. I reached out to Jodi Bartlett by phone today in response to a referral sent by Ms. Ulice Brilliant Mcveigh's health plan.     Third unsuccessful telephone outreach was attempted today. The patient was referred to the case management team for assistance with care management and care coordination. The care management team is pleased to engage with this patient at any time in the future should he/she be interested in assistance from the care management team.   Follow Up Plan: A HIPAA compliant phone message was left for the patient providing contact information and requesting a return call. If patient returns call to provider office, please advise to call [place] at [phone].  [place] Management

## 2020-03-10 ENCOUNTER — Other Ambulatory Visit: Payer: Self-pay | Admitting: Internal Medicine

## 2020-03-10 DIAGNOSIS — I10 Essential (primary) hypertension: Secondary | ICD-10-CM

## 2020-04-04 ENCOUNTER — Telehealth: Payer: Self-pay

## 2020-04-04 NOTE — Telephone Encounter (Signed)
Called pt scheduled appt for 04/13/19. Pt is aware that if she feels worse to go to UC.   KP

## 2020-04-04 NOTE — Telephone Encounter (Unsigned)
Copied from CRM [id number]. Topic: General - Inquiry >> Apr 04, 2020  9:23 AM Daphine Deutscher D wrote: Reason for CRM: Pt complaining of pain rt shoulder.  Came on Saturday afternoon.  No injury to shoulder.  She wanted to speak to a nurse.  CB#[phone]

## 2020-04-12 ENCOUNTER — Ambulatory Visit: Payer: Self-pay | Admitting: Internal Medicine

## 2020-04-19 ENCOUNTER — Other Ambulatory Visit: Payer: Self-pay | Admitting: Internal Medicine

## 2020-04-19 DIAGNOSIS — I10 Essential (primary) hypertension: Secondary | ICD-10-CM

## 2020-05-29 ENCOUNTER — Other Ambulatory Visit: Payer: Self-pay

## 2020-05-29 ENCOUNTER — Ambulatory Visit (INDEPENDENT_AMBULATORY_CARE_PROVIDER_SITE_OTHER): Payer: PPO

## 2020-05-29 VITALS — BP 140/82 | HR 66 | Temp 98.3°F | Resp 16 | Ht 62.0 in | Wt 134.0 lb

## 2020-05-29 DIAGNOSIS — Z780 Asymptomatic menopausal state: Secondary | ICD-10-CM | POA: Diagnosis not present

## 2020-05-29 DIAGNOSIS — Z1231 Encounter for screening mammogram for malignant neoplasm of breast: Secondary | ICD-10-CM | POA: Diagnosis not present

## 2020-05-29 DIAGNOSIS — Z0000 Encounter for general adult medical examination without abnormal findings: Secondary | ICD-10-CM | POA: Diagnosis not present

## 2020-05-29 NOTE — Progress Notes (Signed)
Subjective:   Jodi Bartlett is a 85 y.o. female who presents for Medicare Annual (Subsequent) preventive examination.  Virtual Visit via Telephone Note  I connected with  Jodi Bartlett on 05/29/20 at  2:00 PM EST by telephone and verified that I am speaking with the correct person using two identifiers.  Location: Patient: home Provider: [facility] Persons participating in the virtual visit: [place]   I discussed the limitations, risks, security and privacy concerns of performing an evaluation and management service by telephone and the availability of in person appointments. The patient expressed understanding and agreed to proceed.  Interactive audio and video telecommunications were attempted between this nurse and patient, however failed, due to patient having technical difficulties OR patient did not have access to video capability.  We continued and completed visit with audio only.  Some vital signs may be absent or patient reported.   Clemetine Marker, LPN    Review of Systems     Cardiac Risk Factors include: advanced age (>46men, >67 women);hypertension     Objective:    Today's Vitals   05/29/20 1416  BP: 140/82  Pulse: 66  Resp: 16  Temp: 98.3 F (36.8 C)  TempSrc: Oral  SpO2: 99%  Weight: 134 lb (60.8 kg)  Height: 5\' 2"  (1.575 m)   Body mass index is 24.51 kg/m.  Advanced Directives 05/29/2020 05/26/2019 01/28/2019 05/18/2018 05/14/2017 05/13/2017 01/01/2016  Does Patient Have a Medical Advance Directive? No Yes No No No No No  Type of Advance Directive - Healthcare Power of [place];Living will - - - - -  Does patient want to make changes to medical advance directive? - Yes (MAU/Ambulatory/Procedural Areas - Information given) - - - - -  Copy of [place] in Chart? - No - copy requested - - - - -  Would patient like information on creating a medical advance directive? Yes (MAU/Ambulatory/Procedural Areas - Information given) - -  Yes (MAU/Ambulatory/Procedural Areas - Information given) (No Data) No - Patient declined -    Current Medications (verified) Outpatient Encounter Medications as of 05/29/2020  Medication Sig  . acetaminophen (TYLENOL) 500 MG tablet Take 500 mg by mouth every 6 (six) hours as needed.  . ALPRAZolam (XANAX) 0.25 MG tablet Take 0.25 mg by mouth at bedtime as needed for anxiety.  Marland Kitchen amitriptyline (ELAVIL) 50 MG tablet Take 50 mg by mouth at bedtime.  . bisoprolol (ZEBETA) 5 MG tablet TAKE 1 TABLET BY MOUTH 2 TIMES DAILY **FOLLOW UP IN 6 MONTHS FOR MORE REFILLS**  . buPROPion (WELLBUTRIN SR) 150 MG 12 hr tablet Take 300 mg by mouth daily.  . Cholecalciferol (VITAMIN D3) 2000 units TABS Take by mouth.  . pantoprazole (PROTONIX) 40 MG tablet Take 1 tablet (40 mg total) by mouth 2 (two) times daily.  Marland Kitchen triamterene-hydrochlorothiazide (MAXZIDE-25) 37.5-25 MG tablet TAKE 1 TABLET BY MOUTH DAILY  . vitamin B-12 (CYANOCOBALAMIN) 1000 MCG tablet Take 1,000 mcg by mouth daily.   No facility-administered encounter medications on file as of 05/29/2020.    Allergies (verified) Patient has no known allergies.   History: Past Medical History:  Diagnosis Date  . Abnormality of lung on CXR 01/21/2012   Noted on CXR 2013 - CT suggested a mass but follow up scans showed resolution Last CXR 10/2015 - mild apical scarring and mild COPD  . Allergy   . Anemia    hx of  . Anxiety   . Arthritis   . Back pain   .  Blood transfusion without reported diagnosis   . Bunion 10/19/2007  . Depressed   . Diverticulosis 2010   Colonoscopy  . Dizziness    vertigo  . GERD (gastroesophageal reflux disease)   . Hiatal hernia    EGD   . HTN (hypertension)   . Hx of colonic polyps 2010   Colonoscopy  . IBS (irritable bowel syndrome)   . Interstitial cystitis   . OA (ocular albinism) ([facility])   . Ovarian cancer ([place])   . Personal history of chemotherapy   . Stricture and stenosis of esophagus 2009   EGD   . UTI (lower  urinary tract infection)    hx of     Past Surgical History:  Procedure Laterality Date  . ABDOMINAL HYSTERECTOMY  1962   ovarian cancer  . APPENDECTOMY    . breast biopsy- benign    . BREAST CYST EXCISION Left   . bunion/morton's neuroma  4/07   right foot   . carpal tunnel release-right    . CHOLECYSTECTOMY  1994  . colitis transfusion     hosp at 25  . COLONOSCOPY     very difficul- supp with BE past  . EGD tight ues sphincter  7/09   with dialtion  . FOOT SURGERY  10/09   TMT fusion and bunionectomy  . HEMORRHOID SURGERY    . INCISIONAL HERNIA REPAIR     after [facility]  . left knee surgery    . left shoulder replacement    . stress cardiolite  9/10  . VIDEO BRONCHOSCOPY  04/22/2012   Procedure: VIDEO BRONCHOSCOPY WITH FLUORO;  Surgeon: Juanito Doom, MD;  Location: [facility];  Service: Cardiopulmonary;  Laterality: Bilateral;   Family History  Problem Relation Age of Onset  . Coronary artery disease Father   . Hypertension Father   . Heart disease Father        CAD  . Stroke Mother   . Hypertension Sister   . Hyperlipidemia Sister   . Ovarian cancer Maternal Grandmother   . Colon cancer Neg Hx   . Esophageal cancer Neg Hx   . Rectal cancer Neg Hx   . Stomach cancer Neg Hx   . Breast cancer Neg Hx    Social History   Socioeconomic History  . Marital status: Widowed    Spouse name: Not on file  . Number of children: 1  . Years of education: Not on file  . Highest education level: Associate degree: occupational, Hotel manager, or vocational program  Occupational History  . Occupation: COSMETOLOGIST    Comment: Retired   Tobacco Use  . Smoking status: Never Smoker  . Smokeless tobacco: Never Used  . Tobacco comment: smoking cessation materials not required  Vaping Use  . Vaping Use: Never used  Substance and Sexual Activity  . Alcohol use: Yes    Comment: social wine   . Drug use: No  . Sexual activity: Not Currently  Other Topics Concern  . Not on  file  Social History Narrative   1 daughter. Self employed, retired Theme park manager. Daily caffeine. Lives alone.    Social Determinants of Health   Financial Resource Strain: Low Risk   . Difficulty of Paying Living Expenses: Not hard at all  Food Insecurity: No Food Insecurity  . Worried About Charity fundraiser in the Last Year: Never true  . Ran Out of Food in the Last Year: Never true  Transportation Needs: No Transportation Needs  . Lack of  Transportation (Medical): No  . Lack of Transportation (Non-Medical): No  Physical Activity: Inactive  . Days of Exercise per Week: 0 days  . Minutes of Exercise per Session: 0 min  Stress: No Stress Concern Present  . Feeling of Stress : Not at all  Social Connections: Socially Isolated  . Frequency of Communication with Friends and Family: More than three times a week  . Frequency of Social Gatherings with Friends and Family: Three times a week  . Attends Religious Services: Never  . Active Member of Clubs or Organizations: No  . Attends Archivist Meetings: Never  . Marital Status: Widowed    Tobacco Counseling Counseling given: Not Answered Comment: smoking cessation materials not required   Clinical Intake:  Pre-visit preparation completed: Yes  Pain : No/denies pain     BMI - recorded: 24.51 Nutritional Status: BMI of 19-24  Normal Nutritional Risks: None Diabetes: No  How often do you need to have someone help you when you read instructions, pamphlets, or other written materials from your doctor or pharmacy?: 1 - Never    Interpreter Needed?: No  Information entered by :: Clemetine Marker LPN   Activities of Daily Living In your present state of health, do you have any difficulty performing the following activities: 05/29/2020  Hearing? N  Comment declines hearing aids  Vision? N  Difficulty concentrating or making decisions? N  Walking or climbing stairs? N  Dressing or bathing? N  Doing errands,  shopping? N  Preparing Food and eating ? N  Using the Toilet? N  In the past six months, have you accidently leaked urine? N  Do you have problems with loss of bowel control? N  Managing your Medications? N  Managing your Finances? N  Housekeeping or managing your Housekeeping? N  Some recent data might be hidden    Patient Care Team: Glean Hess, MD as PCP - General (Internal Medicine) Hessie Knows, MD as Consulting Physician (Orthopedic Surgery) Lucilla Lame, MD as Consulting Physician (Gastroenterology) Renata Caprice as Physician Assistant (Orthopedic Surgery) Lew Dawes, MD as Attending Physician (Psychiatry) Sharlet Salina, MD as Referring Physician (Physical Medicine and Rehabilitation) Tyler Pita, MD as Consulting Physician (Pulmonary Disease) Minna Merritts, MD as Consulting Physician (Cardiology)  Indicate any recent Medical Services you may have received from other than [facility] providers in the past year (date may be approximate).     Assessment:   This is a routine wellness examination for Jodi Bartlett.  Hearing/Vision screen  Hearing Screening   125Hz  250Hz  500Hz  1000Hz  2000Hz  3000Hz  4000Hz  6000Hz  8000Hz   Right ear:           Left ear:           Comments: Pt has no difficulty hearing  Vision Screening Comments: [facility] screen[facility]  Dietary issues and exercise activities discussed: Current Exercise Habits: The patient does not participate in regular exercise at present, Exercise limited by: None identified  Goals    . Patient Stated     Patient wants to stay healthy       Depression Screen PHQ 2/9 Scores 05/29/2020 02/11/2020 01/11/2020 05/26/2019 02/03/2019 01/11/2019 05/25/2018  PHQ - 2 Score 0 0 0 0 0 0 0  PHQ- 9 Score - 0 0 - 2 0 -    Fall Risk Fall Risk  05/29/2020 02/11/2020 01/11/2020 05/26/2019 05/25/2018  Falls in the past year? 1 1 1  0 0  Number falls in  past yr: 0 0 0 0 0  Comment - - - -  -  Injury with Fall? 0 0 1 0 0  Risk Factor Category  - - - - -  Risk for fall due to : No Fall Risks - - No Fall Risks History of fall(s)  Follow up Falls prevention discussed Falls evaluation completed Falls evaluation completed Falls prevention discussed Falls evaluation completed    FALL RISK PREVENTION PERTAINING TO THE HOME:  Any stairs in or around the home? Yes  If so, are there any without handrails? No  Home free of loose throw rugs in walkways, pet beds, electrical cords, etc? Yes  Adequate lighting in your home to reduce risk of falls? Yes   ASSISTIVE DEVICES UTILIZED TO PREVENT FALLS:  Life alert? No  Use of a cane, walker or w/c? No  Grab bars in the bathroom? No  Shower chair or bench in shower? No  Elevated toilet seat or a handicapped toilet? No   TIMED UP AND GO:  Was the test performed? Yes .  Length of time to ambulate 10 feet: 4 sec.   Gait steady and fast without use of assistive device  Cognitive Function: Normal cognitive status assessed by direct observation by this Nurse Health Advisor. No abnormalities found.       6CIT Screen 05/18/2018 05/21/2016  What Year? 0 points 0 points  What month? 0 points 0 points  What time? 0 points 0 points  Count back from 20 0 points 0 points  Months in reverse 0 points 0 points  Repeat phrase 0 points 0 points  Total Score 0 0    Immunizations Immunization History  Administered Date(s) Administered  . Fluad Quad(high Dose 65+) 02/03/2019, 01/11/2020  . Influenza Split 01/21/2012  . Influenza, High Dose Seasonal PF 12/31/2017  . Influenza,inj,Quad PF,6+ Mos 03/16/2014, 01/01/2016  . PFIZER(Purple Top)SARS-COV-2 Vaccination 04/15/2019, 05/06/2019, 01/17/2020  . Pneumococcal Conjugate-13 05/21/2016  . Pneumococcal Polysaccharide-23 08/03/2013  . Td 04/01/1994, 11/18/2007     TDAP status: Due, Education has been provided regarding the importance of this vaccine. Advised may receive this vaccine at local  pharmacy or Health Dept. Aware to provide a copy of the vaccination record if obtained from local pharmacy or Health Dept. Verbalized acceptance and understanding.  Flu Vaccine status: Up to date  Pneumococcal vaccine status: Up to date  Covid-19 vaccine status: Completed vaccines  Qualifies for Shingles Vaccine? Yes   Zostavax completed No   Shingrix Completed?: No.    Education has been provided regarding the importance of this vaccine. Patient has been advised to call insurance company to determine out of pocket expense if they have not yet received this vaccine. Advised may also receive vaccine at local pharmacy or Health Dept. Verbalized acceptance and understanding.  Screening Tests Health Maintenance  Topic Date Due  . MAMMOGRAM  01/29/2017  . TETANUS/TDAP  04/01/2021 (Originally 11/17/2017)  . INFLUENZA VACCINE  Completed  . DEXA SCAN  Completed  . COVID-19 Vaccine  Completed  . PNA vac Low Risk Adult  Completed    Health Maintenance  Health Maintenance Due  Topic Date Due  . MAMMOGRAM  01/29/2017    Colorectal cancer screening: No longer required.   Mammogram status: Ordered today. Pt provided with contact info and advised to call to schedule appt.   Bone Density status: Completed 12/31/17. Results reflect: Bone density results: NORMAL. Repeat every 2 years.  Ordered today.   Lung Cancer Screening: (Low Dose CT  Chest recommended if Age 48-80 years, 30 pack-year currently smoking OR have quit w/in 15years.) does not qualify.   Additional Screening:  Hepatitis C Screening: does not qualify.   Vision Screening: Recommended annual ophthalmology exams for early detection of glaucoma and other disorders of the eye. Is the patient up to date with their annual eye exam?  No  Who is the provider or what is the name of the office in which the patient attends annual eye exams? [place] Screening: Recommended annual dental exams for proper oral  hygiene  Community Resource Referral / Chronic Care Management: CRR required this visit?  No   CCM required this visit?  No      Plan:     I have personally reviewed and noted the following in the patient's chart:   . Medical and social history . Use of alcohol, tobacco or illicit drugs  . Current medications and supplements . Functional ability and status . Nutritional status . Physical activity . Advanced directives . List of other physicians . Hospitalizations, surgeries, and ER visits in previous 12 months . Vitals . Screenings to include cognitive, depression, and falls . Referrals and appointments  In addition, I have reviewed and discussed with patient certain preventive protocols, quality metrics, and best practice recommendations. A written personalized care plan for preventive services as well as general preventive health recommendations were provided to patient.     Clemetine Marker, LPN   [id number]   Nurse Notes: none

## 2020-05-29 NOTE — Patient Instructions (Signed)
Jodi Bartlett , Thank you for taking time to come for your Medicare Wellness Visit. I appreciate your ongoing commitment to your health goals. Please review the following plan we discussed and let me know if I can assist you in the future.   Screening recommendations/referrals: Colonoscopy: no longer required.  Mammogram: done 01/30/16. Please call [phone] to schedule your mammogram and bone density screening.  Bone Density: done 12/31/17 Recommended yearly ophthalmology/optometry visit for glaucoma screening and checkup Recommended yearly dental visit for hygiene and checkup  Vaccinations: Influenza vaccine: done 01/11/20 Pneumococcal vaccine: done 05/21/16 Tdap vaccine: due Shingles vaccine: Shingrix discussed. Please contact your pharmacy for coverage information.  Covid-19: done 04/15/19, 05/06/19 & 01/17/20  Advanced directives: Advance directive discussed with you today. I have provided a copy for you to complete at home and have notarized. Once this is complete please bring a copy in to our office so we can scan it into your chart.  Conditions/risks identified: Recommend increasing physical activity to at least 3 days per week.   Next appointment: Follow up in one year for your annual wellness visit    Preventive Care 65 Years and Older, Female Preventive care refers to lifestyle choices and visits with your health care provider that can promote health and wellness. What does preventive care include?  A yearly physical exam. This is also called an annual well check.  Dental exams once or twice a year.  Routine eye exams. Ask your health care provider how often you should have your eyes checked.  Personal lifestyle choices, including:  Daily care of your teeth and gums.  Regular physical activity.  Eating a healthy diet.  Avoiding tobacco and drug use.  Limiting alcohol use.  Practicing safe sex.  Taking low-dose aspirin every day.  Taking vitamin and mineral  supplements as recommended by your health care provider. What happens during an annual well check? The services and screenings done by your health care provider during your annual well check will depend on your age, overall health, lifestyle risk factors, and family history of disease. Counseling  Your health care provider may ask you questions about your:  Alcohol use.  Tobacco use.  Drug use.  Emotional well-being.  Home and relationship well-being.  Sexual activity.  Eating habits.  History of falls.  Memory and ability to understand (cognition).  Work and work Statistician.  Reproductive health. Screening  You may have the following tests or measurements:  Height, weight, and BMI.  Blood pressure.  Lipid and cholesterol levels. These may be checked every 5 years, or more frequently if you are over 7 years old.  Skin check.  Lung cancer screening. You may have this screening every year starting at age 68 if you have a 30-pack-year history of smoking and currently smoke or have quit within the past 15 years.  Fecal occult blood test (FOBT) of the stool. You may have this test every year starting at age 83.  Flexible sigmoidoscopy or colonoscopy. You may have a sigmoidoscopy every 5 years or a colonoscopy every 10 years starting at age 23.  Hepatitis C blood test.  Hepatitis B blood test.  Sexually transmitted disease (STD) testing.  Diabetes screening. This is done by checking your blood sugar (glucose) after you have not eaten for a while (fasting). You may have this done every 1-3 years.  Bone density scan. This is done to screen for osteoporosis. You may have this done starting at age 84.  Mammogram. This may be done  every 1-2 years. Talk to your health care provider about how often you should have regular mammograms. Talk with your health care provider about your test results, treatment options, and if necessary, the need for more tests. Vaccines  Your  health care provider may recommend certain vaccines, such as:  Influenza vaccine. This is recommended every year.  Tetanus, diphtheria, and acellular pertussis (Tdap, Td) vaccine. You may need a Td booster every 10 years.  Zoster vaccine. You may need this after age 62.  Pneumococcal 13-valent conjugate (PCV13) vaccine. One dose is recommended after age 35.  Pneumococcal polysaccharide (PPSV23) vaccine. One dose is recommended after age 72. Talk to your health care provider about which screenings and vaccines you need and how often you need them. This information is not intended to replace advice given to you by your health care provider. Make sure you discuss any questions you have with your health care provider. Document Released: 04/14/2015 Document Revised: 12/06/2015 Document Reviewed: 01/17/2015 Elsevier Interactive Patient Education  2017 [employer] Prevention in the Home Falls can cause injuries. They can happen to people of all ages. There are many things you can do to make your home safe and to help prevent falls. What can I do on the outside of my home?  Regularly fix the edges of walkways and driveways and fix any cracks.  Remove anything that might make you trip as you walk through a door, such as a raised step or threshold.  Trim any bushes or trees on the path to your home.  Use bright outdoor lighting.  Clear any walking paths of anything that might make someone trip, such as rocks or tools.  Regularly check to see if handrails are loose or broken. Make sure that both sides of any steps have handrails.  Any raised decks and porches should have guardrails on the edges.  Have any leaves, snow, or ice cleared regularly.  Use sand or salt on walking paths during winter.  Clean up any spills in your garage right away. This includes oil or grease spills. What can I do in the bathroom?  Use night lights.  Install grab bars by the toilet and in the tub and  shower. Do not use towel bars as grab bars.  Use non-skid mats or decals in the tub or shower.  If you need to sit down in the shower, use a plastic, non-slip stool.  Keep the floor dry. Clean up any water that spills on the floor as soon as it happens.  Remove soap buildup in the tub or shower regularly.  Attach bath mats securely with double-sided non-slip rug tape.  Do not have throw rugs and other things on the floor that can make you trip. What can I do in the bedroom?  Use night lights.  Make sure that you have a light by your bed that is easy to reach.  Do not use any sheets or blankets that are too big for your bed. They should not hang down onto the floor.  Have a firm chair that has side arms. You can use this for support while you get dressed.  Do not have throw rugs and other things on the floor that can make you trip. What can I do in the kitchen?  Clean up any spills right away.  Avoid walking on wet floors.  Keep items that you use a lot in easy-to-reach places.  If you need to reach something above you, use a  strong step stool that has a grab bar.  Keep electrical cords out of the way.  Do not use floor polish or wax that makes floors slippery. If you must use wax, use non-skid floor wax.  Do not have throw rugs and other things on the floor that can make you trip. What can I do with my stairs?  Do not leave any items on the stairs.  Make sure that there are handrails on both sides of the stairs and use them. Fix handrails that are broken or loose. Make sure that handrails are as long as the stairways.  Check any carpeting to make sure that it is firmly attached to the stairs. Fix any carpet that is loose or worn.  Avoid having throw rugs at the top or bottom of the stairs. If you do have throw rugs, attach them to the floor with carpet tape.  Make sure that you have a light switch at the top of the stairs and the bottom of the stairs. If you do not  have them, ask someone to add them for you. What else can I do to help prevent falls?  Wear shoes that:  Do not have high heels.  Have rubber bottoms.  Are comfortable and fit you well.  Are closed at the toe. Do not wear sandals.  If you use a stepladder:  Make sure that it is fully opened. Do not climb a closed stepladder.  Make sure that both sides of the stepladder are locked into place.  Ask someone to hold it for you, if possible.  Clearly mark and make sure that you can see:  Any grab bars or handrails.  First and last steps.  Where the edge of each step is.  Use tools that help you move around (mobility aids) if they are needed. These include:  Canes.  Walkers.  Scooters.  Crutches.  Turn on the lights when you go into a dark area. Replace any light bulbs as soon as they burn out.  Set up your furniture so you have a clear path. Avoid moving your furniture around.  If any of your floors are uneven, fix them.  If there are any pets around you, be aware of where they are.  Review your medicines with your doctor. Some medicines can make you feel dizzy. This can increase your chance of falling. Ask your doctor what other things that you can do to help prevent falls. This information is not intended to replace advice given to you by your health care provider. Make sure you discuss any questions you have with your health care provider. Document Released: 01/12/2009 Document Revised: 08/24/2015 Document Reviewed: 04/22/2014 Elsevier Interactive Patient Education  2017 [employer].

## 2020-06-14 ENCOUNTER — Ambulatory Visit (INDEPENDENT_AMBULATORY_CARE_PROVIDER_SITE_OTHER): Payer: PPO | Admitting: Internal Medicine

## 2020-06-14 ENCOUNTER — Encounter: Payer: Self-pay | Admitting: Internal Medicine

## 2020-06-14 ENCOUNTER — Other Ambulatory Visit: Payer: Self-pay

## 2020-06-14 VITALS — BP 122/64 | HR 73 | Temp 98.0°F | Ht 62.0 in | Wt 131.0 lb

## 2020-06-14 DIAGNOSIS — Z0000 Encounter for general adult medical examination without abnormal findings: Secondary | ICD-10-CM

## 2020-06-14 DIAGNOSIS — J479 Bronchiectasis, uncomplicated: Secondary | ICD-10-CM | POA: Diagnosis not present

## 2020-06-14 DIAGNOSIS — F325 Major depressive disorder, single episode, in full remission: Secondary | ICD-10-CM | POA: Diagnosis not present

## 2020-06-14 DIAGNOSIS — I700 Atherosclerosis of aorta: Secondary | ICD-10-CM

## 2020-06-14 DIAGNOSIS — K219 Gastro-esophageal reflux disease without esophagitis: Secondary | ICD-10-CM

## 2020-06-14 DIAGNOSIS — N1831 Chronic kidney disease, stage 3a: Secondary | ICD-10-CM

## 2020-06-14 DIAGNOSIS — I10 Essential (primary) hypertension: Secondary | ICD-10-CM

## 2020-06-14 LAB — POCT URINALYSIS DIPSTICK
Bilirubin, UA: NEGATIVE
Blood, UA: NEGATIVE
Glucose, UA: NEGATIVE
Ketones, UA: NEGATIVE
Leukocytes, UA: NEGATIVE
Nitrite, UA: NEGATIVE
Protein, UA: NEGATIVE
Spec Grav, UA: 1.01 (ref 1.010–1.025)
Urobilinogen, UA: 0.2 E.U./dL
pH, UA: 6.5 (ref 5.0–8.0)

## 2020-06-14 MED ORDER — BISOPROLOL FUMARATE 5 MG PO TABS: 5.0000 mg | ORAL_TABLET | Freq: Two times a day (BID) | ORAL | 1 refills | Status: DC

## 2020-06-14 MED ORDER — PANTOPRAZOLE SODIUM 40 MG PO TBEC: 40.0000 mg | DELAYED_RELEASE_TABLET | Freq: Two times a day (BID) | ORAL | 1 refills | Status: DC

## 2020-06-14 MED ORDER — TRIAMTERENE-HCTZ 37.5-25 MG PO TABS
1.0000 | ORAL_TABLET | Freq: Every day | ORAL | 1 refills | Status: DC
Start: 2020-06-14 — End: 2021-02-06

## 2020-06-14 NOTE — Progress Notes (Signed)
Date:  06/14/2020   Name:  Jodi Bartlett   DOB:  13-Oct-1935   MRN:  [id number]   Chief Complaint: Annual Exam (Breast exam no pap )  Jodi Bartlett is a 85 y.o. female who presents today for her Complete Annual Exam. She feels well. She reports exercising yard work. She reports she is sleeping well. Breast complaints none.  Mammogram: scheduled 06/20/20 DEXA: 12/2017 scheduled 06/20/20 Colonoscopy: 12/2011  Immunization History  Administered Date(s) Administered  . Fluad Quad(high Dose 65+) 02/03/2019, 01/11/2020  . Influenza Split 01/21/2012  . Influenza, High Dose Seasonal PF 12/31/2017  . Influenza,inj,Quad PF,6+ Mos 03/16/2014, 01/01/2016  . PFIZER(Purple Top)SARS-COV-2 Vaccination 04/15/2019, 05/06/2019, 01/17/2020  . Pneumococcal Conjugate-13 05/21/2016  . Pneumococcal Polysaccharide-23 08/03/2013  . Td 04/01/1994, 11/18/2007    Hypertension This is a chronic problem. The problem is controlled. Pertinent negatives include no chest pain, headaches, palpitations or shortness of breath. Past treatments include diuretics and beta blockers. The current treatment provides significant improvement. There are no compliance problems.  Hypertensive end-organ damage includes kidney disease. There is no history of CAD/MI or CVA.  Gastroesophageal Reflux She complains of heartburn. She reports no abdominal pain, no chest pain, no coughing (occ cough with sputum production unchanged) or no wheezing. This is a recurrent problem. The problem occurs occasionally. Pertinent negatives include no fatigue. Risk factors: hx of esophageal stricture. She has tried a PPI for the symptoms. The treatment provided moderate relief. Past procedures include an EGD.  Depression        This is a chronic (followed by psychiatry) problem.  The problem has been resolved since onset.  Associated symptoms include no fatigue, no headaches and no suicidal ideas.  Past treatments include other medications and  psychotherapy.   Lab Results  Component Value Date   CREATININE 1.17 (H) 01/11/2020   BUN 17 01/11/2020   NA 140 01/11/2020   K 4.6 01/11/2020   CL 101 01/11/2020   CO2 26 01/11/2020   Lab Results  Component Value Date   CHOL 171 02/03/2019   HDL 55 02/03/2019   LDLCALC 97 02/03/2019   TRIG 106 02/03/2019   CHOLHDL 3.1 02/03/2019   Lab Results  Component Value Date   TSH 5.510 (H) 01/11/2020   No results found for: HGBA1C Lab Results  Component Value Date   WBC 7.1 01/11/2020   HGB 12.0 01/11/2020   HCT 36.3 01/11/2020   MCV 96 01/11/2020   PLT 299 01/11/2020   Lab Results  Component Value Date   ALT 14 01/11/2020   AST 16 01/11/2020   ALKPHOS 77 01/11/2020   BILITOT 0.3 01/11/2020   Last vitamin D Lab Results  Component Value Date   VD25OH 49.3 12/31/2017      Review of Systems  Constitutional: Negative for chills, fatigue and fever.  HENT: Negative for congestion, hearing loss, tinnitus, trouble swallowing and voice change.   Eyes: Negative for visual disturbance.  Respiratory: Negative for cough (occ cough with sputum production unchanged), chest tightness, shortness of breath and wheezing.   Cardiovascular: Negative for chest pain, palpitations and leg swelling.  Gastrointestinal: Positive for heartburn. Negative for abdominal pain, constipation, diarrhea and vomiting.       Intermittent IBS/diarrhea sx Right inguinal hernia larger - non tender  Endocrine: Negative for polydipsia and polyuria.  Genitourinary: Negative for dysuria, frequency, genital sores, vaginal bleeding and vaginal discharge.  Musculoskeletal: Positive for back pain (low back discomfort). Negative for arthralgias, gait problem and  joint swelling.  Skin: Negative for color change and rash.  Neurological: Negative for dizziness, tremors, light-headedness and headaches.  Hematological: Negative for adenopathy. Does not bruise/bleed easily.  Psychiatric/Behavioral: Positive for  depression. Negative for dysphoric mood, sleep disturbance and suicidal ideas. The patient is not nervous/anxious.     Patient Active Problem List   Diagnosis Date Noted  . Chronic kidney disease, stage 3a ([place]) 01/12/2020  . Migraine without aura and without status migrainosus, not intractable 02/03/2019  . Inguinal hernia bilateral, non-recurrent 05/25/2018  . Hiatal hernia 05/21/2018  . Aortic atherosclerosis ([place]) 05/21/2018  . Gastroesophageal reflux disease without esophagitis 12/31/2017  . Degenerative lumbar spinal stenosis 01/01/2016  . Primary osteoarthritis of both hips 01/01/2016  . Insomnia 07/17/2012  . Chest pain 06/23/2012  . Bronchiectasis ([place]) 03/02/2012  . Anemia 11/13/2011  . Vitamin D deficiency 10/11/2008  . B12 deficiency 10/04/2008  . IBS 10/01/2007  . Major depression, single episode, in complete remission ([place]) 07/31/2007  . Essential hypertension 07/31/2007  . Chronic back pain 07/31/2007  . History of colonic polyps 07/31/2007    No Known Allergies  Past Surgical History:  Procedure Laterality Date  . ABDOMINAL HYSTERECTOMY  1962   ovarian cancer  . APPENDECTOMY    . breast biopsy- benign    . BREAST CYST EXCISION Left   . bunion/morton's neuroma  4/07   right foot   . carpal tunnel release-right    . CHOLECYSTECTOMY  1994  . colitis transfusion     hosp at 25  . COLONOSCOPY     very difficul- supp with BE past  . EGD tight ues sphincter  7/09   with dialtion  . FOOT SURGERY  10/09   TMT fusion and bunionectomy  . HEMORRHOID SURGERY    . INCISIONAL HERNIA REPAIR     after ccy  . left knee surgery    . left shoulder replacement    . stress cardiolite  9/10  . VIDEO BRONCHOSCOPY  04/22/2012   Procedure: VIDEO BRONCHOSCOPY WITH FLUORO;  Surgeon: Juanito Doom, MD;  Location: [facility];  Service: Cardiopulmonary;  Laterality: Bilateral;    Social History   Tobacco Use  . Smoking status: Never Smoker  . Smokeless tobacco:  Never Used  . Tobacco comment: smoking cessation materials not required  Vaping Use  . Vaping Use: Never used  Substance Use Topics  . Alcohol use: Yes    Comment: social wine   . Drug use: No     Medication list has been reviewed and updated.  Current Meds  Medication Sig  . acetaminophen (TYLENOL) 500 MG tablet Take 500 mg by mouth every 6 (six) hours as needed.  . ALPRAZolam (XANAX) 0.25 MG tablet Take 0.25 mg by mouth at bedtime as needed for anxiety.  Marland Kitchen amitriptyline (ELAVIL) 50 MG tablet Take 50 mg by mouth at bedtime.  . bisoprolol (ZEBETA) 5 MG tablet TAKE 1 TABLET BY MOUTH 2 TIMES DAILY **FOLLOW UP IN 6 MONTHS FOR MORE REFILLS**  . buPROPion (WELLBUTRIN SR) 150 MG 12 hr tablet Take 300 mg by mouth daily.  . Cholecalciferol (VITAMIN D3) 2000 units TABS Take by mouth.  . pantoprazole (PROTONIX) 40 MG tablet Take 1 tablet (40 mg total) by mouth 2 (two) times daily.  Marland Kitchen triamterene-hydrochlorothiazide (MAXZIDE-25) 37.5-25 MG tablet TAKE 1 TABLET BY MOUTH DAILY  . vitamin B-12 (CYANOCOBALAMIN) 1000 MCG tablet Take 1,000 mcg by mouth daily.    PHQ 2/9 Scores 06/14/2020 05/29/2020 02/11/2020 01/11/2020  PHQ - 2 Score 0 0 0 0  PHQ- 9 Score 0 - 0 0    GAD 7 : Generalized Anxiety Score 06/14/2020 02/11/2020 01/11/2020  Nervous, Anxious, on Edge 0 0 0  Control/stop worrying 0 0 0  Worry too much - different things 0 0 0  Trouble relaxing 0 0 0  Restless 0 0 0  Easily annoyed or irritable 0 0 0  Afraid - awful might happen 0 0 0  Total GAD 7 Score 0 0 0  Anxiety Difficulty - - Not difficult at all    BP Readings from Last 3 Encounters:  06/14/20 122/64  05/29/20 140/82  02/11/20 138/80    Physical Exam Vitals and nursing note reviewed.  Constitutional:      General: She is not in acute distress.    Appearance: She is well-developed.  HENT:     Head: Normocephalic and atraumatic.     Right Ear: Tympanic membrane and ear canal normal.     Left Ear: Tympanic membrane  and ear canal normal.     Nose:     Right Sinus: No maxillary sinus tenderness.     Left Sinus: No maxillary sinus tenderness.  Eyes:     General: No scleral icterus.       Right eye: No discharge.        Left eye: No discharge.     Conjunctiva/sclera: Conjunctivae normal.  Neck:     Thyroid: No thyromegaly.     Vascular: No carotid bruit.  Cardiovascular:     Rate and Rhythm: Normal rate and regular rhythm.     Pulses: Normal pulses.     Heart sounds: Normal heart sounds.  Pulmonary:     Effort: Pulmonary effort is normal. No respiratory distress.     Breath sounds: Normal breath sounds and air entry. No wheezing.  Chest:  Breasts:     Right: No mass, nipple discharge, skin change or tenderness.     Left: No mass, nipple discharge, skin change or tenderness.    Abdominal:     General: Bowel sounds are normal.     Palpations: Abdomen is soft.     Tenderness: There is no abdominal tenderness.     Hernia: A hernia is present. Hernia is present in the right inguinal area (non tender, easily reducible).  Musculoskeletal:     Cervical back: Normal range of motion. No erythema.     Right lower leg: No edema.     Left lower leg: No edema.  Lymphadenopathy:     Cervical: No cervical adenopathy.  Skin:    General: Skin is warm and dry.     Findings: No rash.  Neurological:     Mental Status: She is alert and oriented to person, place, and time.     Cranial Nerves: No cranial nerve deficit.     Sensory: No sensory deficit.     Deep Tendon Reflexes: Reflexes are normal and symmetric.  Psychiatric:        Attention and Perception: Attention normal.        Mood and Affect: Mood normal.        Cognition and Memory: Cognition normal.     Wt Readings from Last 3 Encounters:  06/14/20 131 lb (59.4 kg)  05/29/20 134 lb (60.8 kg)  02/11/20 135 lb (61.2 kg)    BP 122/64   Pulse 73   Temp 98 F (36.7 C) (Oral)   Ht 5\' 2"  (1.575 m)  Wt 131 lb (59.4 kg)   SpO2 97%   BMI  23.96 kg/m   Assessment and Plan: 1. Annual physical exam Normal exam Continue healthy diet and regular exercise  2. Essential hypertension Clinically stable exam with well controlled BP. Tolerating medications without side effects at this time. Pt to continue current regimen and low sodium diet; benefits of regular exercise as able discussed. - TSH - POCT urinalysis dipstick - triamterene-hydrochlorothiazide (MAXZIDE-25) 37.5-25 MG tablet; Take 1 tablet by mouth daily.  Dispense: 90 tablet; Refill: 1 - bisoprolol (ZEBETA) 5 MG tablet; Take 1 tablet (5 mg total) by mouth in the morning and at bedtime.  Dispense: 180 tablet; Refill: 1  3. Aortic atherosclerosis (HCC) Check cholesterol and advise - Lipid panel  4. Gastroesophageal reflux disease without esophagitis Symptoms well controlled on daily PPI No red flag signs such as weight loss, n/v, melena Will continue protonix - CBC with Differential/Platelet - pantoprazole (PROTONIX) 40 MG tablet; Take 1 tablet (40 mg total) by mouth 2 (two) times daily.  Dispense: 180 tablet; Refill: 1  5. Chronic kidney disease, stage 3a (HCC) Mild aged related decrease has been fairly stable Pt reminded to avoid NSAIDS and remain hydrated - Comprehensive metabolic panel  6. Major depression, single episode, in complete remission ([place]) Clinically stable on current regimen with good control of symptoms, No SI or HI. Will continue current therapy per Psychiatry  7. Bronchiectasis without complication (HCC) Sx are mild and unchanged Pt discontinued medication due to lack of perceived benefit.   Partially dictated using Editor, commissioning. Any errors are unintentional.  Halina Maidens, MD [facility]  06/14/2020

## 2020-06-15 ENCOUNTER — Inpatient Hospital Stay: Admission: RE | Admit: 2020-06-15 | Payer: PPO | Source: Ambulatory Visit

## 2020-06-15 ENCOUNTER — Ambulatory Visit: Payer: PPO

## 2020-06-15 LAB — CBC WITH DIFFERENTIAL/PLATELET
Basophils Absolute: 0 10*3/uL (ref 0.0–0.2)
Basos: 1 %
EOS (ABSOLUTE): 0.2 10*3/uL (ref 0.0–0.4)
Eos: 2 %
Hematocrit: 35.9 % (ref 34.0–46.6)
Hemoglobin: 12.1 g/dL (ref 11.1–15.9)
Immature Grans (Abs): 0 10*3/uL (ref 0.0–0.1)
Immature Granulocytes: 0 %
Lymphocytes Absolute: 1.9 10*3/uL (ref 0.7–3.1)
Lymphs: 24 %
MCH: 32.6 pg (ref 26.6–33.0)
MCHC: 33.7 g/dL (ref 31.5–35.7)
MCV: 97 fL (ref 79–97)
Monocytes Absolute: 0.5 10*3/uL (ref 0.1–0.9)
Monocytes: 6 %
Neutrophils Absolute: 5.3 10*3/uL (ref 1.4–7.0)
Neutrophils: 67 %
Platelets: 327 10*3/uL (ref 150–450)
RBC: 3.71 x10E6/uL — ABNORMAL LOW (ref 3.77–5.28)
RDW: 12 % (ref 11.7–15.4)
WBC: 7.9 10*3/uL (ref 3.4–10.8)

## 2020-06-15 LAB — COMPREHENSIVE METABOLIC PANEL
ALT: 15 IU/L (ref 0–32)
AST: 20 IU/L (ref 0–40)
Albumin/Globulin Ratio: 2.2 (ref 1.2–2.2)
Albumin: 4.9 g/dL — ABNORMAL HIGH (ref 3.6–4.6)
Alkaline Phosphatase: 62 IU/L (ref 44–121)
BUN/Creatinine Ratio: 17 (ref 12–28)
BUN: 20 mg/dL (ref 8–27)
Bilirubin Total: 0.4 mg/dL (ref 0.0–1.2)
CO2: 20 mmol/L (ref 20–29)
Calcium: 10.2 mg/dL (ref 8.7–10.3)
Chloride: 102 mmol/L (ref 96–106)
Creatinine, Ser: 1.21 mg/dL — ABNORMAL HIGH (ref 0.57–1.00)
Globulin, Total: 2.2 g/dL (ref 1.5–4.5)
Glucose: 110 mg/dL — ABNORMAL HIGH (ref 65–99)
Potassium: 4.5 mmol/L (ref 3.5–5.2)
Sodium: 144 mmol/L (ref 134–144)
Total Protein: 7.1 g/dL (ref 6.0–8.5)
eGFR: 45 mL/min/{1.73_m2} — ABNORMAL LOW (ref >59)

## 2020-06-15 LAB — LIPID PANEL
Chol/HDL Ratio: 3 ratio (ref 0.0–4.4)
Cholesterol, Total: 177 mg/dL (ref 100–199)
HDL: 60 mg/dL (ref >39)
LDL Chol Calc (NIH): 96 mg/dL (ref 0–99)
Triglycerides: 116 mg/dL (ref 0–149)
VLDL Cholesterol Cal: 21 mg/dL (ref 5–40)

## 2020-06-15 LAB — TSH: TSH: 5.67 u[IU]/mL — ABNORMAL HIGH (ref 0.450–4.500)

## 2020-06-20 ENCOUNTER — Ambulatory Visit
Admission: RE | Admit: 2020-06-20 | Discharge: 2020-06-20 | Disposition: A | Discharge status: Home / Self Care | Payer: PPO | Source: Ambulatory Visit | Attending: Internal Medicine | Admitting: Internal Medicine

## 2020-06-20 ENCOUNTER — Other Ambulatory Visit: Payer: Self-pay

## 2020-06-20 ENCOUNTER — Encounter: Payer: Self-pay | Admitting: Internal Medicine

## 2020-06-20 DIAGNOSIS — M810 Age-related osteoporosis without current pathological fracture: Secondary | ICD-10-CM | POA: Insufficient documentation

## 2020-06-20 DIAGNOSIS — Z1231 Encounter for screening mammogram for malignant neoplasm of breast: Secondary | ICD-10-CM | POA: Diagnosis not present

## 2020-06-20 DIAGNOSIS — Z780 Asymptomatic menopausal state: Secondary | ICD-10-CM | POA: Insufficient documentation

## 2020-06-20 DIAGNOSIS — Z13820 Encounter for screening for osteoporosis: Secondary | ICD-10-CM | POA: Insufficient documentation

## 2020-06-21 ENCOUNTER — Other Ambulatory Visit: Payer: Self-pay | Admitting: Internal Medicine

## 2020-06-21 DIAGNOSIS — N6489 Other specified disorders of breast: Secondary | ICD-10-CM

## 2020-06-21 DIAGNOSIS — R928 Other abnormal and inconclusive findings on diagnostic imaging of breast: Secondary | ICD-10-CM

## 2020-07-03 ENCOUNTER — Ambulatory Visit
Admission: RE | Admit: 2020-07-03 | Discharge: 2020-07-03 | Disposition: A | Discharge status: Home / Self Care | Payer: PPO | Source: Ambulatory Visit | Attending: Internal Medicine | Admitting: Internal Medicine

## 2020-07-03 ENCOUNTER — Ambulatory Visit: Admission: RE | Admit: 2020-07-03 | Payer: PPO | Source: Ambulatory Visit

## 2020-07-03 ENCOUNTER — Other Ambulatory Visit: Payer: Self-pay

## 2020-07-03 DIAGNOSIS — R928 Other abnormal and inconclusive findings on diagnostic imaging of breast: Secondary | ICD-10-CM | POA: Diagnosis not present

## 2020-07-03 DIAGNOSIS — N6489 Other specified disorders of breast: Secondary | ICD-10-CM | POA: Diagnosis not present

## 2020-07-03 DIAGNOSIS — R922 Inconclusive mammogram: Secondary | ICD-10-CM | POA: Diagnosis not present

## 2020-07-17 DIAGNOSIS — F329 Major depressive disorder, single episode, unspecified: Secondary | ICD-10-CM | POA: Diagnosis not present

## 2020-10-09 ENCOUNTER — Telehealth: Payer: Self-pay

## 2020-10-09 NOTE — Telephone Encounter (Signed)
Copied from [place] [id number]. Topic: General - Other >> Oct 09, 2020  4:07 PM Tessa Lerner A wrote: Reason for CRM: Patient would like to discuss their behavioral health with their PCP  The patient shares that their psychiatrist Dr. Thurmond Butts will be retiring in early October   The patient would like to discuss this further

## 2020-10-10 NOTE — Telephone Encounter (Signed)
Called pt let her know that when Dr. Skipper Cliche someone should be taking over his patients. Told pt to call and see and call back if she has any more questions. Pt sees a specialist for her meds that's who she would need to continue getting her meds from a psychiatrist. Pt verbalized understanding.  KP

## 2020-12-11 DIAGNOSIS — F329 Major depressive disorder, single episode, unspecified: Secondary | ICD-10-CM | POA: Diagnosis not present

## 2021-02-05 ENCOUNTER — Other Ambulatory Visit: Payer: Self-pay | Admitting: Internal Medicine

## 2021-02-05 DIAGNOSIS — I10 Essential (primary) hypertension: Secondary | ICD-10-CM

## 2021-02-05 NOTE — Telephone Encounter (Signed)
Requested medication (s) are due for refill today: Yes  Requested medication (s) are on the active medication list: Yes  Last refill:  06/14/20  Future visit scheduled: No  Notes to clinic:  Unable to refill per protocol, appointment needed. Pt called, left VM to call and schedule appt.      Requested Prescriptions  Pending Prescriptions Disp Refills   triamterene-hydrochlorothiazide (MAXZIDE-25) 37.5-25 MG tablet [Pharmacy Med Name: TRIAMTERENE-HCTZ 37.5-25 MG TAB] 90 tablet 1    Sig: Take 1 tablet by mouth daily.     Cardiovascular: Diuretic Combos Failed - 02/05/2021  3:36 PM      Failed - Cr in normal range and within 360 days    Creatinine  Date Value Ref Range Status  12/10/2011 0.95 0.60 - 1.30 mg/dL Final   Creatinine, Ser  Date Value Ref Range Status  06/14/2020 1.21 (H) 0.57 - 1.00 mg/dL Final          Failed - Valid encounter within last 6 months    Recent Outpatient Visits           7 months ago Annual physical exam   [facility] Glean Hess, MD   12 months ago Essential hypertension   [facility] Glean Hess, MD   1 year ago Essential hypertension   [facility] Glean Hess, MD   2 years ago Annual physical exam   [facility] Glean Hess, MD   2 years ago Bronchiectasis with acute exacerbation [facility])   [facility] Glean Hess, MD              Passed - K in normal range and within 360 days    Potassium  Date Value Ref Range Status  06/14/2020 4.5 3.5 - 5.2 mmol/L Final          Passed - Na in normal range and within 360 days    Sodium  Date Value Ref Range Status  06/14/2020 144 134 - 144 mmol/L Final          Passed - Ca in normal range and within 360 days    Calcium  Date Value Ref Range Status  06/14/2020 10.2 8.7 - 10.3 mg/dL Final          Passed - Last BP in normal range    BP Readings from Last 1 Encounters:  06/14/20 122/64           bisoprolol  (ZEBETA) 5 MG tablet [Pharmacy Med Name: BISOPROLOL FUMARATE 5 MG TAB] 180 tablet 1    Sig: TAKE 1 TABLET BY MOUTH IN THE MORNING AND AT BEDTIME     Cardiovascular:  Beta Blockers Failed - 02/05/2021  3:36 PM      Failed - Valid encounter within last 6 months    Recent Outpatient Visits           7 months ago Annual physical exam   [facility] Glean Hess, MD   12 months ago Essential hypertension   [place], Laura H, MD   1 year ago Essential hypertension   [facility] Glean Hess, MD   2 years ago Annual physical exam   [facility] Glean Hess, MD   2 years ago Bronchiectasis with acute exacerbation [facility])   [place], MD              Passed - Last BP in  normal range    BP Readings from Last 1 Encounters:  06/14/20 122/64          Passed - Last Heart Rate in normal range    Pulse Readings from Last 1 Encounters:  06/14/20 73

## 2021-02-05 NOTE — Telephone Encounter (Signed)
Pt called, left VM to call office to schedule appt for 6 month f/up for medication refill requested.

## 2021-02-06 ENCOUNTER — Other Ambulatory Visit: Payer: Self-pay | Admitting: Internal Medicine

## 2021-02-06 ENCOUNTER — Telehealth: Payer: Self-pay

## 2021-02-06 ENCOUNTER — Other Ambulatory Visit: Payer: Self-pay

## 2021-02-06 DIAGNOSIS — I10 Essential (primary) hypertension: Secondary | ICD-10-CM

## 2021-02-06 MED ORDER — BISOPROLOL FUMARATE 5 MG PO TABS: ORAL_TABLET | ORAL | 0 refills | Status: DC

## 2021-02-06 NOTE — Telephone Encounter (Signed)
Sent in prescription for 30 days.

## 2021-02-06 NOTE — Telephone Encounter (Signed)
Copied from [place] #[id number]. Topic: General - Other >> Feb 06, 2021  3:32 PM Leward Quan A wrote: Reason for CRM: Patient called in and scheduled an appointment to be seen on 02/12/21 but wanted to know if Dr Army Melia can please call in 30 more of her bisoprolol (ZEBETA) 5 MG tablet  to make it a 30 day supply. Please advise

## 2021-02-07 NOTE — Telephone Encounter (Signed)
I called [facility] to inquire why we got a duplicate request for these 2 medications.   They were filled on 02/06/2021 per Anderson Malta so I have refused the duplicate refill requests.  Requested Prescriptions  Pending Prescriptions Disp Refills  . triamterene-hydrochlorothiazide (MAXZIDE-25) 37.5-25 MG tablet [Pharmacy Med Name: TRIAMTERENE-HCTZ 37.5-25 MG TAB] 90 tablet     Sig: TAKE 1 TABLET BY MOUTH DAILY     Cardiovascular: Diuretic Combos Failed - 02/06/2021 10:27 AM      Failed - Cr in normal range and within 360 days    Creatinine  Date Value Ref Range Status  12/10/2011 0.95 0.60 - 1.30 mg/dL Final   Creatinine, Ser  Date Value Ref Range Status  06/14/2020 1.21 (H) 0.57 - 1.00 mg/dL Final         Failed - Valid encounter within last 6 months    Recent Outpatient Visits          7 months ago Annual physical exam   [facility] Glean Hess, MD   12 months ago Essential hypertension   [facility] Glean Hess, MD   1 year ago Essential hypertension   [facility] Glean Hess, MD   2 years ago Annual physical exam   [facility] Glean Hess, MD   2 years ago Bronchiectasis with acute exacerbation [facility])   [place], MD      Future Appointments            In 5 days Glean Hess, MD [facility] [place] in normal range and within 360 days    Potassium  Date Value Ref Range Status  06/14/2020 4.5 3.5 - 5.2 mmol/L Final         Passed - Na in normal range and within 360 days    Sodium  Date Value Ref Range Status  06/14/2020 144 134 - 144 mmol/L Final         Passed - Ca in normal range and within 360 days    Calcium  Date Value Ref Range Status  06/14/2020 10.2 8.7 - 10.3 mg/dL Final         Passed - Last BP in normal range    BP Readings from Last 1 Encounters:  06/14/20 122/64         . bisoprolol (ZEBETA) 5 MG tablet  [Pharmacy Med Name: BISOPROLOL FUMARATE 5 MG TAB] 180 tablet     Sig: TAKE 1 TABLET BY MOUTH IN THE MORNING AND AT BEDTIME     Cardiovascular:  Beta Blockers Failed - 02/06/2021 10:27 AM      Failed - Valid encounter within last 6 months    Recent Outpatient Visits          7 months ago Annual physical exam   [facility] Glean Hess, MD   12 months ago Essential hypertension   [facility] Glean Hess, MD   1 year ago Essential hypertension   [facility] Glean Hess, MD   2 years ago Annual physical exam   [facility] Glean Hess, MD   2 years ago Bronchiectasis with acute exacerbation [facility])   [facility] Glean Hess, MD      Future Appointments            In 5 days Army Melia,  Jesse Sans, MD [facility] in normal range    BP Readings from Last 1 Encounters:  06/14/20 122/64         Passed - Last Heart Rate in normal range    Pulse Readings from Last 1 Encounters:  06/14/20 73

## 2021-02-12 ENCOUNTER — Encounter: Payer: Self-pay | Admitting: Internal Medicine

## 2021-02-12 ENCOUNTER — Other Ambulatory Visit: Payer: Self-pay

## 2021-02-12 ENCOUNTER — Ambulatory Visit (INDEPENDENT_AMBULATORY_CARE_PROVIDER_SITE_OTHER): Payer: PPO | Admitting: Internal Medicine

## 2021-02-12 VITALS — BP 138/78 | HR 71 | Ht 62.0 in | Wt 139.6 lb

## 2021-02-12 DIAGNOSIS — K219 Gastro-esophageal reflux disease without esophagitis: Secondary | ICD-10-CM

## 2021-02-12 DIAGNOSIS — I10 Essential (primary) hypertension: Secondary | ICD-10-CM

## 2021-02-12 DIAGNOSIS — N1831 Chronic kidney disease, stage 3a: Secondary | ICD-10-CM | POA: Diagnosis not present

## 2021-02-12 DIAGNOSIS — F325 Major depressive disorder, single episode, in full remission: Secondary | ICD-10-CM | POA: Diagnosis not present

## 2021-02-12 DIAGNOSIS — Z23 Encounter for immunization: Secondary | ICD-10-CM | POA: Diagnosis not present

## 2021-02-12 MED ORDER — PANTOPRAZOLE SODIUM 40 MG PO TBEC: 40.0000 mg | DELAYED_RELEASE_TABLET | Freq: Two times a day (BID) | ORAL | 1 refills | Status: DC

## 2021-02-12 MED ORDER — TRIAMTERENE-HCTZ 37.5-25 MG PO TABS
1.0000 | ORAL_TABLET | Freq: Every day | ORAL | 1 refills | Status: DC
Start: 2021-02-12 — End: 2021-07-24

## 2021-02-12 MED ORDER — BISOPROLOL FUMARATE 5 MG PO TABS
ORAL_TABLET | ORAL | 1 refills | Status: DC
Start: 2021-02-12 — End: 2021-08-02

## 2021-02-12 NOTE — Progress Notes (Signed)
Date:  02/12/2021   Name:  Jodi Bartlett   DOB:  10-May-1935   MRN:  [id number]   Chief Complaint: Hypertension  Hypertension This is a chronic problem. The problem is controlled (130/75). Pertinent negatives include no chest pain, headaches, palpitations or shortness of breath. Past treatments include diuretics and beta blockers. Hypertensive end-organ damage includes kidney disease (CKD stage 3). There is no history of CAD/MI or CVA.  Gastroesophageal Reflux She complains of heartburn. She reports no abdominal pain, no chest pain, no coughing or no wheezing. This is a recurrent problem. The problem occurs rarely. Pertinent negatives include no fatigue. She has tried a PPI for the symptoms.  Depression        This is a chronic (Psych is retiring and I agreed to continue her meds - she has enough for about 6 months refills right now) problem.The problem is unchanged.  Associated symptoms include no fatigue and no headaches.  Treatments tried: wellbutrin, elavil and prn xanax.  Compliance with treatment is good.  Lab Results  Component Value Date   CREATININE 1.21 (H) 06/14/2020   BUN 20 06/14/2020   NA 144 06/14/2020   K 4.5 06/14/2020   CL 102 06/14/2020   CO2 20 06/14/2020   Lab Results  Component Value Date   CHOL 177 06/14/2020   HDL 60 06/14/2020   LDLCALC 96 06/14/2020   TRIG 116 06/14/2020   CHOLHDL 3.0 06/14/2020   Lab Results  Component Value Date   TSH 5.670 (H) 06/14/2020   No results found for: HGBA1C Lab Results  Component Value Date   WBC 7.9 06/14/2020   HGB 12.1 06/14/2020   HCT 35.9 06/14/2020   MCV 97 06/14/2020   PLT 327 06/14/2020   Lab Results  Component Value Date   ALT 15 06/14/2020   AST 20 06/14/2020   ALKPHOS 62 06/14/2020   BILITOT 0.4 06/14/2020     Review of Systems  Constitutional:  Negative for fatigue and unexpected weight change.  HENT:  Negative for nosebleeds.   Eyes:  Negative for visual disturbance.  Respiratory:   Negative for cough, chest tightness, shortness of breath and wheezing.   Cardiovascular:  Negative for chest pain, palpitations and leg swelling.  Gastrointestinal:  Positive for heartburn. Negative for abdominal pain, constipation and diarrhea.  Neurological:  Negative for dizziness, weakness, light-headedness and headaches.  Psychiatric/Behavioral:  Positive for depression.    Patient Active Problem List   Diagnosis Date Noted   Osteoporosis 06/20/2020   Chronic kidney disease, stage 3a ([place]) 01/12/2020   Migraine without aura and without status migrainosus, not intractable 02/03/2019   Inguinal hernia bilateral, non-recurrent 05/25/2018   Hiatal hernia 05/21/2018   Aortic atherosclerosis ([place]) 05/21/2018   Gastroesophageal reflux disease without esophagitis 12/31/2017   Degenerative lumbar spinal stenosis 01/01/2016   Primary osteoarthritis of both hips 01/01/2016   Insomnia 07/17/2012   Chest pain 06/23/2012   Bronchiectasis ([place]) 03/02/2012   Anemia 11/13/2011   Vitamin D deficiency 10/11/2008   B12 deficiency 10/04/2008   IBS 10/01/2007   Major depression, single episode, in complete remission ([place]) 07/31/2007   Essential hypertension 07/31/2007   Chronic back pain 07/31/2007   History of colonic polyps 07/31/2007    No Known Allergies  Past Surgical History:  Procedure Laterality Date   ABDOMINAL HYSTERECTOMY  1962   ovarian cancer   APPENDECTOMY     breast biopsy- benign     BREAST CYST EXCISION Left  bunion/morton's neuroma  4/07   right foot    carpal tunnel release-right     CHOLECYSTECTOMY  1994   colitis transfusion     hosp at 25   COLONOSCOPY     very difficul- supp with BE past   EGD tight ues sphincter  7/09   with dialtion   FOOT SURGERY  10/09   TMT fusion and bunionectomy   [place]     after ccy   left knee surgery     left shoulder replacement     stress cardiolite  9/10   VIDEO BRONCHOSCOPY   04/22/2012   Procedure: VIDEO BRONCHOSCOPY WITH FLUORO;  Surgeon: Juanito Doom, MD;  Location: [facility];  Service: Cardiopulmonary;  Laterality: Bilateral;    Social History   Tobacco Use   Smoking status: Never   Smokeless tobacco: Never   Tobacco comments:    smoking cessation materials not required  Vaping Use   Vaping Use: Never used  Substance Use Topics   Alcohol use: Yes    Comment: social wine    Drug use: No     Medication list has been reviewed and updated.  Current Meds  Medication Sig   acetaminophen (TYLENOL) 500 MG tablet Take 500 mg by mouth every 6 (six) hours as needed.   ALPRAZolam (XANAX) 0.25 MG tablet Take 0.25 mg by mouth at bedtime as needed for anxiety.   amitriptyline (ELAVIL) 50 MG tablet Take 50 mg by mouth at bedtime.   bisoprolol (ZEBETA) 5 MG tablet TAKE 1 TABLET BY MOUTH IN THE MORNING AND AT BEDTIME   buPROPion (WELLBUTRIN SR) 150 MG 12 hr tablet Take 300 mg by mouth daily.   Cholecalciferol (VITAMIN D3) 2000 units TABS Take by mouth.   pantoprazole (PROTONIX) 40 MG tablet Take 1 tablet (40 mg total) by mouth 2 (two) times daily.   triamterene-hydrochlorothiazide (MAXZIDE-25) 37.5-25 MG tablet TAKE 1 TABLET BY MOUTH DAILY   vitamin B-12 (CYANOCOBALAMIN) 1000 MCG tablet Take 1,000 mcg by mouth daily.    PHQ 2/9 Scores 02/12/2021 06/14/2020 05/29/2020 02/11/2020  PHQ - 2 Score 0 0 0 0  PHQ- 9 Score 0 0 - 0    GAD 7 : Generalized Anxiety Score 02/12/2021 06/14/2020 02/11/2020 01/11/2020  Nervous, Anxious, on Edge 0 0 0 0  Control/stop worrying 0 0 0 0  Worry too much - different things 0 0 0 0  Trouble relaxing 0 0 0 0  Restless 0 0 0 0  Easily annoyed or irritable 0 0 0 0  Afraid - awful might happen 0 0 0 0  Total GAD 7 Score 0 0 0 0  Anxiety Difficulty Not difficult at all - - Not difficult at all    BP Readings from Last 3 Encounters:  02/12/21 138/78  06/14/20 122/64  05/29/20 140/82    Physical Exam Vitals and nursing  note reviewed.  Constitutional:      General: She is not in acute distress.    Appearance: She is well-developed.  HENT:     Head: Normocephalic and atraumatic.  Neck:     Vascular: No carotid bruit.  Cardiovascular:     Rate and Rhythm: Normal rate and regular rhythm.     Pulses: Normal pulses.  Pulmonary:     Effort: Pulmonary effort is normal. No respiratory distress.     Breath sounds: No wheezing or rhonchi.  Musculoskeletal:     Cervical back: Normal  range of motion.     Right lower leg: No edema.     Left lower leg: No edema.  Lymphadenopathy:     Cervical: No cervical adenopathy.  Skin:    General: Skin is warm and dry.     Findings: No rash.  Neurological:     Mental Status: She is alert and oriented to person, place, and time.  Psychiatric:        Mood and Affect: Mood normal.        Behavior: Behavior normal.    Wt Readings from Last 3 Encounters:  02/12/21 139 lb 9.6 oz (63.3 kg)  06/14/20 131 lb (59.4 kg)  05/29/20 134 lb (60.8 kg)    BP 138/78   Pulse 71   Ht 5\' 2"  (1.575 m)   Wt 139 lb 9.6 oz (63.3 kg)   SpO2 98%   BMI 25.53 kg/m   Assessment and Plan: 1. Essential hypertension Clinically stable exam with well controlled BP. Tolerating medications without side effects at this time. Pt to continue current regimen and low sodium diet; benefits of regular exercise as able discussed. - bisoprolol (ZEBETA) 5 MG tablet; TAKE 1 TABLET BY MOUTH IN THE MORNING AND AT BEDTIME  Dispense: 180 tablet; Refill: 1 - triamterene-hydrochlorothiazide (MAXZIDE-25) 37.5-25 MG tablet; Take 1 tablet by mouth daily.  Dispense: 90 tablet; Refill: 1  2. Chronic kidney disease, stage 3a ([place]) Continue to monitor - Basic metabolic panel  3. Gastroesophageal reflux disease without esophagitis Symptoms well controlled on daily PPI No red flag signs such as weight loss, n/v, melena Will continue Protonix. - pantoprazole (PROTONIX) 40 MG tablet; Take 1 tablet (40 mg total)  by mouth 2 (two) times daily.  Dispense: 180 tablet; Refill: 1  4. Major depression, single episode, in complete remission ([place]) Doing well. I agree to take over the medication at the next visit. Uses Xanax PRN - about once a week. Bring bottles to next appointment to verify the doses and discuss the amount of xanax needed per 90 days.   Partially dictated using Editor, commissioning. Any errors are unintentional.  Halina Maidens, MD [facility]  02/12/2021

## 2021-02-13 LAB — BASIC METABOLIC PANEL
BUN/Creatinine Ratio: 19 (ref 12–28)
BUN: 23 mg/dL (ref 8–27)
CO2: 24 mmol/L (ref 20–29)
Calcium: 10 mg/dL (ref 8.7–10.3)
Chloride: 100 mmol/L (ref 96–106)
Creatinine, Ser: 1.19 mg/dL — ABNORMAL HIGH (ref 0.57–1.00)
Glucose: 88 mg/dL (ref 70–99)
Potassium: 4.9 mmol/L (ref 3.5–5.2)
Sodium: 139 mmol/L (ref 134–144)
eGFR: 45 mL/min/{1.73_m2} — ABNORMAL LOW (ref >59)

## 2021-05-30 ENCOUNTER — Ambulatory Visit: Payer: PPO

## 2021-07-02 ENCOUNTER — Telehealth: Payer: Self-pay | Admitting: Internal Medicine

## 2021-07-02 NOTE — Telephone Encounter (Signed)
Copied from [place] [id number]. Topic: Medicare AWV ?>> Jul 02, 2021  3:18 PM Cher Nakai R wrote: ?Reason for CRM:  ?Left message for patient to call back and schedule Medicare Annual Wellness Visit (AWV) in office.  ? ?If unable to come into the office for AWV,  please offer to do virtually or by telephone. ? ?Last AWV: 05/29/2020 ? ?Please schedule at anytime with [facility] Health Advisor.     ? ?30 minute appointment for Virtual or phone ?45 minute appointment for in office or Initial virtual/phone ? ?Any questions, please call me at [phone] ?

## 2021-07-23 ENCOUNTER — Other Ambulatory Visit: Payer: Self-pay | Admitting: Internal Medicine

## 2021-07-23 DIAGNOSIS — I10 Essential (primary) hypertension: Secondary | ICD-10-CM

## 2021-07-24 NOTE — Telephone Encounter (Signed)
Requested Prescriptions  ?Pending Prescriptions Disp Refills  ?? triamterene-hydrochlorothiazide (MAXZIDE-25) 37.5-25 MG tablet [Pharmacy Med Name: TRIAMTERENE-HCTZ 37.5-25 MG TAB] 90 tablet 1  ?  Sig: TAKE 1 TABLET BY MOUTH DAILY  ?  ? Cardiovascular: Diuretic Combos Failed - 07/23/2021  3:51 PM  ?  ?  Failed - Cr in normal range and within 180 days  ?  Creatinine  ?Date Value Ref Range Status  ?12/10/2011 0.95 0.60 - 1.30 mg/dL Final  ? ?Creatinine, Ser  ?Date Value Ref Range Status  ?02/12/2021 1.19 (H) 0.57 - 1.00 mg/dL Final  ?   ?  ?  Passed - K in normal range and within 180 days  ?  Potassium  ?Date Value Ref Range Status  ?02/12/2021 4.9 3.5 - 5.2 mmol/L Final  ?   ?  ?  Passed - Na in normal range and within 180 days  ?  Sodium  ?Date Value Ref Range Status  ?02/12/2021 139 134 - 144 mmol/L Final  ?   ?  ?  Passed - Last BP in normal range  ?  BP Readings from Last 1 Encounters:  ?02/12/21 138/78  ?   ?  ?  Passed - Valid encounter within last 6 months  ?  Recent Outpatient Visits   ?      ? 5 months ago Essential hypertension  ? [facility] Glean Hess, MD  ? 1 year ago Annual physical exam  ? [facility] Glean Hess, MD  ? 1 year ago Essential hypertension  ? [facility] Glean Hess, MD  ? 1 year ago Essential hypertension  ? [facility] Glean Hess, MD  ? 2 years ago Annual physical exam  ? [facility] Glean Hess, MD  ?  ?  ?Future Appointments   ?        ? In 2 weeks [name], MD [facility] [place]  ?  ? ?  ?  ?  ? ? ?

## 2021-08-01 ENCOUNTER — Other Ambulatory Visit: Payer: Self-pay | Admitting: Internal Medicine

## 2021-08-01 DIAGNOSIS — I10 Essential (primary) hypertension: Secondary | ICD-10-CM

## 2021-08-02 NOTE — Telephone Encounter (Signed)
Requested Prescriptions  ?Pending Prescriptions Disp Refills  ?? bisoprolol (ZEBETA) 5 MG tablet [Pharmacy Med Name: BISOPROLOL FUMARATE 5 MG TAB] 180 tablet 0  ?  Sig: TAKE 1 TABLET BY MOUTH IN THE MORNING AND AT BEDTIME  ?  ? Cardiovascular: Beta Blockers 2 Failed - 08/01/2021  4:17 PM  ?  ?  Failed - Cr in normal range and within 360 days  ?  Creatinine  ?Date Value Ref Range Status  ?12/10/2011 0.95 0.60 - 1.30 mg/dL Final  ? ?Creatinine, Ser  ?Date Value Ref Range Status  ?02/12/2021 1.19 (H) 0.57 - 1.00 mg/dL Final  ?   ?  ?  Passed - Last BP in normal range  ?  BP Readings from Last 1 Encounters:  ?02/12/21 138/78  ?   ?  ?  Passed - Last Heart Rate in normal range  ?  Pulse Readings from Last 1 Encounters:  ?02/12/21 71  ?   ?  ?  Passed - Valid encounter within last 6 months  ?  Recent Outpatient Visits   ?      ? 5 months ago Essential hypertension  ? [facility] Glean Hess, MD  ? 1 year ago Annual physical exam  ? [facility] Glean Hess, MD  ? 1 year ago Essential hypertension  ? [facility] Glean Hess, MD  ? 1 year ago Essential hypertension  ? [facility] Glean Hess, MD  ? 2 years ago Annual physical exam  ? [facility] Glean Hess, MD  ?  ?  ?Future Appointments   ?        ? In 1 week Glean Hess, MD [facility] [place]  ?  ? ?  ?  ?  ? ?

## 2021-08-07 ENCOUNTER — Other Ambulatory Visit: Payer: Self-pay | Admitting: Internal Medicine

## 2021-08-08 NOTE — Telephone Encounter (Signed)
Please review. Last office visit 02/12/21. ? ?KP

## 2021-08-08 NOTE — Telephone Encounter (Signed)
Requested medication (s) are due for refill today:   Provider to review ? ?Requested medication (s) are on the active medication list:   Yes as historical ? ?Future visit scheduled:   Yes 08/13/2021 with Army Melia ? ? ?Last ordered: 3 yrs ago ? ?Returned because it's a non delegated refill and it's historical.   ? ?Requested Prescriptions  ?Pending Prescriptions Disp Refills  ? ALPRAZolam (XANAX) 0.25 MG tablet [Pharmacy Med Name: ALPRAZOLAM 0.25 MG TAB] 90 tablet   ?  Sig: TAKE 1 TABLET BY MOUTH DAILY AS NEEDED.  ?  ? Not Delegated - Psychiatry: Anxiolytics/Hypnotics 2 Failed - 08/07/2021  9:53 AM  ?  ?  Failed - This refill cannot be delegated  ?  ?  Failed - Urine Drug Screen completed in last 360 days  ?  ?  Passed - Patient is not pregnant  ?  ?  Passed - Valid encounter within last 6 months  ?  Recent Outpatient Visits   ? ?      ? 5 months ago Essential hypertension  ? [facility] Glean Hess, MD  ? 1 year ago Annual physical exam  ? [facility] Glean Hess, MD  ? 1 year ago Essential hypertension  ? [facility] Glean Hess, MD  ? 1 year ago Essential hypertension  ? [facility] Glean Hess, MD  ? 2 years ago Annual physical exam  ? [facility] Glean Hess, MD  ? ?  ?  ?Future Appointments   ? ?        ? In 5 days Glean Hess, MD [facility] [place]  ? ?  ? ? ?  ?  ?  ? ?

## 2021-08-13 ENCOUNTER — Ambulatory Visit (INDEPENDENT_AMBULATORY_CARE_PROVIDER_SITE_OTHER): Payer: PPO | Admitting: Internal Medicine

## 2021-08-13 ENCOUNTER — Encounter: Payer: Self-pay | Admitting: Internal Medicine

## 2021-08-13 ENCOUNTER — Other Ambulatory Visit: Payer: Self-pay | Admitting: Internal Medicine

## 2021-08-13 VITALS — BP 130/58 | HR 72 | Ht 62.0 in | Wt 136.0 lb

## 2021-08-13 VITALS — BP 130/58 | HR 68 | Ht 62.0 in | Wt 136.0 lb

## 2021-08-13 DIAGNOSIS — J0100 Acute maxillary sinusitis, unspecified: Secondary | ICD-10-CM

## 2021-08-13 DIAGNOSIS — F325 Major depressive disorder, single episode, in full remission: Secondary | ICD-10-CM | POA: Diagnosis not present

## 2021-08-13 DIAGNOSIS — Z0000 Encounter for general adult medical examination without abnormal findings: Secondary | ICD-10-CM

## 2021-08-13 DIAGNOSIS — J479 Bronchiectasis, uncomplicated: Secondary | ICD-10-CM

## 2021-08-13 DIAGNOSIS — I10 Essential (primary) hypertension: Secondary | ICD-10-CM | POA: Diagnosis not present

## 2021-08-13 DIAGNOSIS — Z1231 Encounter for screening mammogram for malignant neoplasm of breast: Secondary | ICD-10-CM

## 2021-08-13 DIAGNOSIS — M810 Age-related osteoporosis without current pathological fracture: Secondary | ICD-10-CM

## 2021-08-13 DIAGNOSIS — K219 Gastro-esophageal reflux disease without esophagitis: Secondary | ICD-10-CM | POA: Diagnosis not present

## 2021-08-13 DIAGNOSIS — M5442 Lumbago with sciatica, left side: Secondary | ICD-10-CM

## 2021-08-13 DIAGNOSIS — I700 Atherosclerosis of aorta: Secondary | ICD-10-CM

## 2021-08-13 LAB — POCT URINALYSIS DIPSTICK
Bilirubin, UA: NEGATIVE
Blood, UA: NEGATIVE
Glucose, UA: NEGATIVE
Ketones, UA: NEGATIVE
Leukocytes, UA: NEGATIVE
Nitrite, UA: NEGATIVE
Protein, UA: NEGATIVE
Spec Grav, UA: 1.01 (ref 1.010–1.025)
Urobilinogen, UA: 0.2 E.U./dL
pH, UA: 6 (ref 5.0–8.0)

## 2021-08-13 MED ORDER — BUPROPION HCL ER (SR) 150 MG PO TB12: 300.0000 mg | ORAL_TABLET | Freq: Every day | ORAL | 1 refills | Status: DC

## 2021-08-13 MED ORDER — AZITHROMYCIN 250 MG PO TABS: ORAL_TABLET | ORAL | 0 refills | Status: AC

## 2021-08-13 MED ORDER — GABAPENTIN 100 MG PO CAPS: 200.0000 mg | ORAL_CAPSULE | Freq: Every day | ORAL | 1 refills | Status: DC

## 2021-08-13 MED ORDER — ALPRAZOLAM 0.25 MG PO TABS: 0.2500 mg | ORAL_TABLET | Freq: Every evening | ORAL | 5 refills | Status: DC | PRN

## 2021-08-13 MED ORDER — AMITRIPTYLINE HCL 50 MG PO TABS: 50.0000 mg | ORAL_TABLET | Freq: Every day | ORAL | 1 refills | Status: DC

## 2021-08-13 NOTE — Progress Notes (Signed)
? ?Subjective:  ? Jodi Bartlett is a 86 y.o. female who presents for Medicare Annual (Subsequent) preventive examination. ? ?I connected with  Jodi Bartlett on 08/13/21 by an in person visit and verified that I am speaking with the correct person using two identifiers. ? ?Patient Location: Other:  In OFFICE ? ?Provider Location: Office/Clinic ? ?I discussed the limitations of evaluation and management by telemedicine. The patient expressed understanding and agreed to proceed.  ? ?Review of Systems    ?Defer to PCP ?  ? ?   ?Objective:  ?  ?Today's Vitals  ? 08/13/21 1012  ?BP: (!) 130/58  ?Pulse: 72  ?Weight: 136 lb (61.7 kg)  ?Height: '5\' 2"'$  (1.575 m)  ?PainSc: 0-No pain  ? ?Body mass index is 24.87 kg/m?. ? ? ?  05/29/2020  ?  2:22 PM 05/26/2019  ?  2:05 PM 01/28/2019  ?  3:51 PM 05/18/2018  ?  2:21 PM 05/14/2017  ?  9:50 AM 05/13/2017  ? 12:53 PM 01/01/2016  ?  3:30 PM  ?Advanced Directives  ?Does Patient Have a Medical Advance Directive? No Yes No No No No No  ?Type of Corporate treasurer of [place];Living will       ?Does patient want to make changes to medical advance directive?  Yes (MAU/Ambulatory/Procedural Areas - Information given)       ?Copy of [place] in Chart?  No - copy requested       ?Would patient like information on creating a medical advance directive? Yes (MAU/Ambulatory/Procedural Areas - Information given)   Yes (MAU/Ambulatory/Procedural Areas - Information given)  No - Patient declined   ? ? ?Current Medications (verified) ?Outpatient Encounter Medications as of 08/13/2021  ?Medication Sig  ? acetaminophen (TYLENOL) 500 MG tablet Take 500 mg by mouth every 6 (six) hours as needed.  ? ALPRAZolam (XANAX) 0.25 MG tablet Take 1 tablet (0.25 mg total) by mouth at bedtime as needed for anxiety.  ? amitriptyline (ELAVIL) 50 MG tablet Take 1 tablet (50 mg total) by mouth at bedtime.  ? azithromycin (ZITHROMAX Z-PAK) 250 MG tablet UAD  ? bisoprolol (ZEBETA) 5 MG tablet  TAKE 1 TABLET BY MOUTH IN THE MORNING AND AT BEDTIME  ? buPROPion (WELLBUTRIN SR) 150 MG 12 hr tablet Take 2 tablets (300 mg total) by mouth daily.  ? Cholecalciferol (VITAMIN D3) 2000 units TABS Take by mouth.  ? gabapentin (NEURONTIN) 100 MG capsule Take 2 capsules (200 mg total) by mouth at bedtime.  ? pantoprazole (PROTONIX) 40 MG tablet Take 1 tablet (40 mg total) by mouth 2 (two) times daily.  ? triamterene-hydrochlorothiazide (MAXZIDE-25) 37.5-25 MG tablet TAKE 1 TABLET BY MOUTH DAILY  ? vitamin B-12 (CYANOCOBALAMIN) 1000 MCG tablet Take 1,000 mcg by mouth daily.  ? [DISCONTINUED] ALPRAZolam (XANAX) 0.25 MG tablet Take 0.25 mg by mouth at bedtime as needed for anxiety.  ? [DISCONTINUED] amitriptyline (ELAVIL) 50 MG tablet Take 50 mg by mouth at bedtime.  ? [DISCONTINUED] buPROPion (WELLBUTRIN SR) 150 MG 12 hr tablet Take 300 mg by mouth daily.  ? ?No facility-administered encounter medications on file as of 08/13/2021.  ? ? ?Allergies (verified) ?Patient has no known allergies.  ? ?History: ?Past Medical History:  ?Diagnosis Date  ? Abnormality of lung on CXR 01/21/2012  ? Noted on CXR 2013 - CT suggested a mass but follow up scans showed resolution Last CXR 10/2015 - mild apical scarring and mild COPD  ? Allergy   ?  Anemia   ? hx of  ? Anxiety   ? Arthritis   ? Back pain   ? Blood transfusion without reported diagnosis   ? Bunion 10/19/2007  ? Depressed   ? Diverticulosis 2010  ? Colonoscopy  ? Dizziness   ? vertigo  ? GERD (gastroesophageal reflux disease)   ? Hiatal hernia   ? EGD   ? HTN (hypertension)   ? Hx of colonic polyps 2010  ? Colonoscopy  ? IBS (irritable bowel syndrome)   ? Interstitial cystitis   ? OA (ocular albinism) ([place])   ? Ovarian cancer ([place])   ? Personal history of chemotherapy   ? Stricture and stenosis of esophagus 2009  ? EGD   ? UTI (lower urinary tract infection)   ? hx of    ? ?Past Surgical History:  ?Procedure Laterality Date  ? ABDOMINAL HYSTERECTOMY  1962  ? ovarian cancer  ?  APPENDECTOMY    ? breast biopsy- benign    ? BREAST CYST EXCISION Left   ? bunion/morton's neuroma  4/07  ? right foot   ? carpal tunnel release-right    ? CHOLECYSTECTOMY  1994  ? colitis transfusion    ? hosp at 61  ? COLONOSCOPY    ? very difficul- supp with BE past  ? EGD tight ues sphincter  7/09  ? with dialtion  ? FOOT SURGERY  10/09  ? TMT fusion and bunionectomy  ? HEMORRHOID SURGERY    ? INCISIONAL HERNIA REPAIR    ? after [facility]cy  ? left knee surgery    ? left shoulder replacement    ? stress cardiolite  9/10  ? VIDEO BRONCHOSCOPY  04/22/2012  ? Procedure: VIDEO BRONCHOSCOPY WITH FLUORO;  Surgeon: Juanito Doom, MD;  Location: [facility];  Service: Cardiopulmonary;  Laterality: Bilateral;  ? ?Family History  ?Problem Relation Age of Onset  ? Coronary artery disease Father   ? Hypertension Father   ? Heart disease Father   ?     CAD  ? Stroke Mother   ? Hypertension Sister   ? Hyperlipidemia Sister   ? Ovarian cancer Maternal Grandmother   ? Colon cancer Neg Hx   ? Esophageal cancer Neg Hx   ? Rectal cancer Neg Hx   ? Stomach cancer Neg Hx   ? Breast cancer Neg Hx   ? ?Social History  ? ?Socioeconomic History  ? Marital status: Widowed  ?  Spouse name: Not on file  ? Number of children: 1  ? Years of education: Not on file  ? Highest education level: Associate degree: occupational, Hotel manager, or vocational program  ?Occupational History  ? Occupation: COSMETOLOGIST  ?  Comment: Retired   ?Tobacco Use  ? Smoking status: Never  ? Smokeless tobacco: Never  ? Tobacco comments:  ?  smoking cessation materials not required  ?Vaping Use  ? Vaping Use: Never used  ?Substance and Sexual Activity  ? Alcohol use: Yes  ?  Comment: social wine   ? Drug use: No  ? Sexual activity: Not Currently  ?Other Topics Concern  ? Not on file  ?Social History Narrative  ? 1 daughter. Self employed, retired Theme park manager. Daily caffeine. Lives alone.   ? ?Social Determinants of Health  ? ?Financial Resource Strain: Low Risk   ?  Difficulty of Paying Living Expenses: Not hard at all  ?Food Insecurity: No Food Insecurity  ? Worried About Charity fundraiser in the Last Year: Never true  ?  Ran Out of Food in the Last Year: Never true  ?Transportation Needs: No Transportation Needs  ? Lack of Transportation (Medical): No  ? Lack of Transportation (Non-Medical): No  ?Physical Activity: Insufficiently Active  ? Days of Exercise per Week: 3 days  ? Minutes of Exercise per Session: 30 min  ?Stress: No Stress Concern Present  ? Feeling of Stress : Not at all  ?Social Connections: Socially Isolated  ? Frequency of Communication with Friends and Family: More than three times a week  ? Frequency of Social Gatherings with Friends and Family: More than three times a week  ? Attends Religious Services: Never  ? Active Member of Clubs or Organizations: No  ? Attends Archivist Meetings: Never  ? Marital Status: Widowed  ? ? ?Tobacco Counseling ?Counseling given: Not Answered ?Tobacco comments: smoking cessation materials not required ? ? ?Clinical Intake: ? ?Pre-visit preparation completed: Yes ? ?Pain : No/denies pain ?Pain Score: 0-No pain ? ?  ? ?BMI - recorded: 24.87 ?Nutritional Status: BMI of 19-24  Normal ?Nutritional Risks: None ?Diabetes: No ? ?  ? ?Diabetic?NO ? ?Interpreter Needed?: No ? ?Information entered by :: Wyatt Haste, CMA ? ? ?Activities of Daily Living ? ?  08/13/2021  ? 10:17 AM 02/12/2021  ?  1:36 PM  ?In your present state of health, do you have any difficulty performing the following activities:  ?Hearing? 0 0  ?Vision? 0 0  ?Difficulty concentrating or making decisions? 0 0  ?Walking or climbing stairs? 0 0  ?Dressing or bathing? 0 0  ?Doing errands, shopping? 0 0  ? ? ?Patient Care Team: ?Glean Hess, MD as PCP - General (Internal Medicine) ?Hessie Knows, MD as Consulting Physician (Orthopedic Surgery) ?Lucilla Lame, MD as Consulting Physician (Gastroenterology) ?Renata Caprice as Physician Assistant  (Orthopedic Surgery) ?Lew Dawes, MD as Attending Physician (Psychiatry) ?Sharlet Salina, MD as Referring Physician (Physical Medicine and Rehabilitation) ?Tyler Pita, MD as Consulting Physic

## 2021-08-13 NOTE — Patient Instructions (Signed)
Stop Theraflu ? ?Continue Mucinex ? ?Use Tussionex cough syrup at night if needed for sleep ?

## 2021-08-13 NOTE — Progress Notes (Signed)
? ? ?Date:  08/13/2021  ? ?Name:  Jodi Bartlett   DOB:  October 21, 1935   MRN:  [id number] ? ? ?Chief Complaint: Annual Exam ?Jodi Bartlett is a 86 y.o. female who presents today for her Complete Annual Exam. She feels fairly well. She reports exercising some. She reports she is sleeping fairly well. Breast complaints - none. ? ?Mammogram: 05/2020 repeat 08/2020 normal ?DEXA: 05/2020 osteoporosis ?Pap smear: discontinued ?Colonoscopy: aged out ? ?Health Maintenance Due  ?Topic Date Due  ? Zoster Vaccines- Shingrix (1 of 2) Never done  ? TETANUS/TDAP  11/17/2017  ? MAMMOGRAM  06/20/2021  ?  ?Immunization History  ?Administered Date(s) Administered  ? Fluad Quad(high Dose 65+) 02/03/2019, 01/11/2020, 02/12/2021  ? Influenza Split 01/21/2012  ? Influenza, High Dose Seasonal PF 12/31/2017  ? Influenza,inj,Quad PF,6+ Mos 03/16/2014, 01/01/2016  ? PFIZER(Purple Top)SARS-COV-2 Vaccination 04/15/2019, 05/06/2019, 01/17/2020  ? Pneumococcal Conjugate-13 05/21/2016  ? Pneumococcal Polysaccharide-23 08/03/2013  ? Td 04/01/1994, 11/18/2007  ? ? ?Hypertension ?This is a chronic problem. The problem is controlled. Pertinent negatives include no chest pain, headaches, palpitations or shortness of breath. Past treatments include beta blockers and diuretics. The current treatment provides significant improvement. Hypertensive end-organ damage includes kidney disease. There is no history of CAD/MI or CVA.  ?Gastroesophageal Reflux ?She complains of coughing, heartburn and a sore throat. She reports no abdominal pain, no chest pain or no wheezing. This is a recurrent problem. The problem occurs occasionally. Pertinent negatives include no fatigue. She has tried a PPI for the symptoms.  ?Depression ?       This is a chronic problem.The problem is unchanged.  Associated symptoms include no fatigue and no headaches.  Treatments tried: bupropion and xanax. ?Cough ?This is a new problem. The current episode started in the past 7 days. The problem  has been waxing and waning. The cough is Productive of sputum. Associated symptoms include heartburn, nasal congestion (and green sinus discharge), postnasal drip, rhinorrhea and a sore throat. Pertinent negatives include no chest pain, chills, ear pain, fever, headaches, rash, shortness of breath or wheezing. She has tried OTC cough suppressant and rest for the symptoms. The treatment provided mild relief.  ? ?Lab Results  ?Component Value Date  ? NA 139 02/12/2021  ? K 4.9 02/12/2021  ? CO2 24 02/12/2021  ? GLUCOSE 88 02/12/2021  ? BUN 23 02/12/2021  ? CREATININE 1.19 (H) 02/12/2021  ? CALCIUM 10.0 02/12/2021  ? EGFR 45 (L) 02/12/2021  ? GFRNONAA 44 (L) 01/11/2020  ? ?Lab Results  ?Component Value Date  ? CHOL 177 06/14/2020  ? HDL 60 06/14/2020  ? [place] 96 06/14/2020  ? TRIG 116 06/14/2020  ? CHOLHDL 3.0 06/14/2020  ? ?Lab Results  ?Component Value Date  ? TSH 5.670 (H) 06/14/2020  ? ?No results found for: HGBA1C ?Lab Results  ?Component Value Date  ? WBC 7.9 06/14/2020  ? HGB 12.1 06/14/2020  ? HCT 35.9 06/14/2020  ? MCV 97 06/14/2020  ? PLT 327 06/14/2020  ? ?Lab Results  ?Component Value Date  ? ALT 15 06/14/2020  ? AST 20 06/14/2020  ? ALKPHOS 62 06/14/2020  ? BILITOT 0.4 06/14/2020  ? ?Lab Results  ?Component Value Date  ? VD25OH 49.3 12/31/2017  ?  ? ?Review of Systems  ?Constitutional:  Negative for chills, fatigue and fever.  ?HENT:  Positive for postnasal drip, rhinorrhea and sore throat. Negative for congestion, ear pain, hearing loss, tinnitus, trouble swallowing and voice change.   ?Eyes:  Negative for visual disturbance.  ?Respiratory:  Positive for cough. Negative for chest tightness, shortness of breath and wheezing.   ?Cardiovascular:  Negative for chest pain, palpitations and leg swelling.  ?Gastrointestinal:  Positive for heartburn. Negative for abdominal pain, constipation, diarrhea and vomiting.  ?Endocrine: Negative for polydipsia and polyuria.  ?Genitourinary:  Negative for dysuria,  frequency, genital sores, vaginal bleeding and vaginal discharge.  ?Musculoskeletal:  Positive for back pain (and left leg tingling). Negative for arthralgias, gait problem and joint swelling.  ?Skin:  Negative for color change and rash.  ?Neurological:  Negative for dizziness, tremors, light-headedness and headaches.  ?Hematological:  Negative for adenopathy. Does not bruise/bleed easily.  ?Psychiatric/Behavioral:  Positive for depression. Negative for dysphoric mood and sleep disturbance. The patient is not nervous/anxious.   ? ?Patient Active Problem List  ? Diagnosis Date Noted  ? Osteoporosis 06/20/2020  ? Chronic kidney disease, stage 3a ([place]) 01/12/2020  ? Migraine without aura and without status migrainosus, not intractable 02/03/2019  ? Inguinal hernia bilateral, non-recurrent 05/25/2018  ? Hiatal hernia 05/21/2018  ? Aortic atherosclerosis ([place]) 05/21/2018  ? Gastroesophageal reflux disease without esophagitis 12/31/2017  ? Degenerative lumbar spinal stenosis 01/01/2016  ? Primary osteoarthritis of both hips 01/01/2016  ? Insomnia 07/17/2012  ? Chest pain 06/23/2012  ? Bronchiectasis ([place]) 03/02/2012  ? Anemia 11/13/2011  ? Vitamin D deficiency 10/11/2008  ? B12 deficiency 10/04/2008  ? IBS 10/01/2007  ? Major depression, single episode, in complete remission ([place]) 07/31/2007  ? Essential hypertension 07/31/2007  ? Chronic back pain 07/31/2007  ? History of colonic polyps 07/31/2007  ? ? ?No Known Allergies ? ?Past Surgical History:  ?Procedure Laterality Date  ? ABDOMINAL HYSTERECTOMY  1962  ? ovarian cancer  ? APPENDECTOMY    ? breast biopsy- benign    ? BREAST CYST EXCISION Left   ? bunion/morton's neuroma  4/07  ? right foot   ? carpal tunnel release-right    ? CHOLECYSTECTOMY  1994  ? colitis transfusion    ? hosp at 57  ? COLONOSCOPY    ? very difficul- supp with BE past  ? EGD tight ues sphincter  7/09  ? with dialtion  ? FOOT SURGERY  10/09  ? TMT fusion and bunionectomy  ? HEMORRHOID SURGERY    ?  INCISIONAL HERNIA REPAIR    ? after ccy  ? left knee surgery    ? left shoulder replacement    ? stress cardiolite  9/10  ? VIDEO BRONCHOSCOPY  04/22/2012  ? Procedure: VIDEO BRONCHOSCOPY WITH FLUORO;  Surgeon: Juanito Doom, MD;  Location: [facility];  Service: Cardiopulmonary;  Laterality: Bilateral;  ? ? ?Social History  ? ?Tobacco Use  ? Smoking status: Never  ? Smokeless tobacco: Never  ? Tobacco comments:  ?  smoking cessation materials not required  ?Vaping Use  ? Vaping Use: Never used  ?Substance Use Topics  ? Alcohol use: Yes  ?  Comment: social wine   ? Drug use: No  ? ? ? ?Medication list has been reviewed and updated. ? ?Current Meds  ?Medication Sig  ? acetaminophen (TYLENOL) 500 MG tablet Take 500 mg by mouth every 6 (six) hours as needed.  ? ALPRAZolam (XANAX) 0.25 MG tablet Take 1 tablet (0.25 mg total) by mouth at bedtime as needed for anxiety.  ? amitriptyline (ELAVIL) 50 MG tablet Take 1 tablet (50 mg total) by mouth at bedtime.  ? azithromycin (ZITHROMAX Z-PAK) 250 MG tablet UAD  ? bisoprolol (  ZEBETA) 5 MG tablet TAKE 1 TABLET BY MOUTH IN THE MORNING AND AT BEDTIME  ? buPROPion (WELLBUTRIN SR) 150 MG 12 hr tablet Take 2 tablets (300 mg total) by mouth daily.  ? Cholecalciferol (VITAMIN D3) 2000 units TABS Take by mouth.  ? gabapentin (NEURONTIN) 100 MG capsule Take 2 capsules (200 mg total) by mouth at bedtime.  ? pantoprazole (PROTONIX) 40 MG tablet Take 1 tablet (40 mg total) by mouth 2 (two) times daily.  ? triamterene-hydrochlorothiazide (MAXZIDE-25) 37.5-25 MG tablet TAKE 1 TABLET BY MOUTH DAILY  ? vitamin B-12 (CYANOCOBALAMIN) 1000 MCG tablet Take 1,000 mcg by mouth daily.  ? ? ? ?  08/13/2021  ? 10:17 AM 02/12/2021  ?  1:36 PM 06/14/2020  ?  9:50 AM 02/11/2020  ?  9:55 AM  ?GAD 7 : Generalized Anxiety Score  ?Nervous, Anxious, on Edge 0 0 0 0  ?Control/stop worrying 0 0 0 0  ?Worry too much - different things 0 0 0 0  ?Trouble relaxing 0 0 0 0  ?Restless 0 0 0 0  ?Easily annoyed or  irritable 0 0 0 0  ?Afraid - awful might happen 0 0 0 0  ?Total GAD 7 Score 0 0 0 0  ?Anxiety Difficulty Not difficult at all Not difficult at all    ? ? ? ?  08/13/2021  ? 10:17 AM  ?Depression screen [facility] 2/9

## 2021-08-14 LAB — LIPID PANEL
Chol/HDL Ratio: 4.1 ratio (ref 0.0–4.4)
Cholesterol, Total: 180 mg/dL (ref 100–199)
HDL: 44 mg/dL (ref >39)
LDL Chol Calc (NIH): 116 mg/dL — ABNORMAL HIGH (ref 0–99)
Triglycerides: 112 mg/dL (ref 0–149)
VLDL Cholesterol Cal: 20 mg/dL (ref 5–40)

## 2021-08-14 LAB — CBC WITH DIFFERENTIAL/PLATELET
Basophils Absolute: 0 10*3/uL (ref 0.0–0.2)
Basos: 0 %
EOS (ABSOLUTE): 0.1 10*3/uL (ref 0.0–0.4)
Eos: 2 %
Hematocrit: 36.2 % (ref 34.0–46.6)
Hemoglobin: 12.4 g/dL (ref 11.1–15.9)
Immature Grans (Abs): 0 10*3/uL (ref 0.0–0.1)
Immature Granulocytes: 0 %
Lymphocytes Absolute: 1.4 10*3/uL (ref 0.7–3.1)
Lymphs: 16 %
MCH: 33.1 pg — ABNORMAL HIGH (ref 26.6–33.0)
MCHC: 34.3 g/dL (ref 31.5–35.7)
MCV: 97 fL (ref 79–97)
Monocytes Absolute: 0.5 10*3/uL (ref 0.1–0.9)
Monocytes: 6 %
Neutrophils Absolute: 6.4 10*3/uL (ref 1.4–7.0)
Neutrophils: 76 %
Platelets: 385 10*3/uL (ref 150–450)
RBC: 3.75 x10E6/uL — ABNORMAL LOW (ref 3.77–5.28)
RDW: 12.4 % (ref 11.7–15.4)
WBC: 8.4 10*3/uL (ref 3.4–10.8)

## 2021-08-14 LAB — COMPREHENSIVE METABOLIC PANEL
ALT: 15 IU/L (ref 0–32)
AST: 19 IU/L (ref 0–40)
Albumin/Globulin Ratio: 2 (ref 1.2–2.2)
Albumin: 4.7 g/dL — ABNORMAL HIGH (ref 3.6–4.6)
Alkaline Phosphatase: 70 IU/L (ref 44–121)
BUN/Creatinine Ratio: 17 (ref 12–28)
BUN: 23 mg/dL (ref 8–27)
Bilirubin Total: 0.3 mg/dL (ref 0.0–1.2)
CO2: 24 mmol/L (ref 20–29)
Calcium: 9.7 mg/dL (ref 8.7–10.3)
Chloride: 101 mmol/L (ref 96–106)
Creatinine, Ser: 1.33 mg/dL — ABNORMAL HIGH (ref 0.57–1.00)
Globulin, Total: 2.4 g/dL (ref 1.5–4.5)
Glucose: 110 mg/dL — ABNORMAL HIGH (ref 70–99)
Potassium: 4.6 mmol/L (ref 3.5–5.2)
Sodium: 140 mmol/L (ref 134–144)
Total Protein: 7.1 g/dL (ref 6.0–8.5)
eGFR: 40 mL/min/{1.73_m2} — ABNORMAL LOW (ref >59)

## 2021-08-14 LAB — VITAMIN D 25 HYDROXY (VIT D DEFICIENCY, FRACTURES): Vit D, 25-Hydroxy: 49.5 ng/mL (ref 30.0–100.0)

## 2021-08-14 LAB — TSH: TSH: 4.27 u[IU]/mL (ref 0.450–4.500)

## 2021-08-14 NOTE — Telephone Encounter (Signed)
Requested medications are due for refill today.  no ? ?Requested medications are on the active medications list.  yes ? ?Last refill. 08/13/2021 #30 5 refills ? ?Future visit scheduled.   yes ? ?Notes to clinic.  Medication refill not delegated. Medication refilled 08/13/2021 ? ? ? ?Requested Prescriptions  ?Pending Prescriptions Disp Refills  ? ALPRAZolam (XANAX) 0.25 MG tablet [Pharmacy Med Name: ALPRAZOLAM 0.25 MG TAB] 90 tablet   ?  Sig: TAKE 1 TABLET BY MOUTH DAILY AS NEEDED.  ?  ? Not Delegated - Psychiatry: Anxiolytics/Hypnotics 2 Failed - 08/13/2021 10:26 AM  ?  ?  Failed - This refill cannot be delegated  ?  ?  Failed - Urine Drug Screen completed in last 360 days  ?  ?  Passed - Patient is not pregnant  ?  ?  Passed - Valid encounter within last 6 months  ?  Recent Outpatient Visits   ? ?      ? Yesterday Encounter for Medicare annual wellness exam  ? [facility]an [facility], [facility]  ? Yesterday Annual physical exam  ? [facility] Glean Hess, MD  ? 6 months ago Essential hypertension  ? [facility] Glean Hess, MD  ? 1 year ago Annual physical exam  ? [facility] Glean Hess, MD  ? 1 year ago Essential hypertension  ? [facility] Glean Hess, MD  ? ?  ?  ?Future Appointments   ? ?        ? In 6 months [name], MD [facility], [place]  ? In 1 year Army Melia, Jesse Sans, MD [facility], [place]  ? ?  ? ? ?  ?  ?  ?  ?

## 2021-11-08 ENCOUNTER — Other Ambulatory Visit: Payer: Self-pay | Admitting: Internal Medicine

## 2021-11-08 DIAGNOSIS — I10 Essential (primary) hypertension: Secondary | ICD-10-CM

## 2021-11-09 NOTE — Telephone Encounter (Signed)
Requested Prescriptions  Pending Prescriptions Disp Refills  . triamterene-hydrochlorothiazide (MAXZIDE-25) 37.5-25 MG tablet [Pharmacy Med Name: TRIAMTERENE-HCTZ 37.5-25 MG TAB] 90 tablet 1    Sig: TAKE 1 TABLET BY MOUTH DAILY     Cardiovascular: Diuretic Combos Failed - 11/08/2021  4:36 PM      Failed - Cr in normal range and within 180 days    Creatinine  Date Value Ref Range Status  12/10/2011 0.95 0.60 - 1.30 mg/dL Final   Creatinine, Ser  Date Value Ref Range Status  08/13/2021 1.33 (H) 0.57 - 1.00 mg/dL Final         Passed - K in normal range and within 180 days    Potassium  Date Value Ref Range Status  08/13/2021 4.6 3.5 - 5.2 mmol/L Final         Passed - Na in normal range and within 180 days    Sodium  Date Value Ref Range Status  08/13/2021 140 134 - 144 mmol/L Final         Passed - Last BP in normal range    BP Readings from Last 1 Encounters:  08/13/21 (!) 130/58         Passed - Valid encounter within last 6 months    Recent Outpatient Visits          2 months ago Encounter for [employer] annual wellness exam   [facility] Glean [facility], [facility]   2 months ago Annual physical exam   [facility] Glean Hess, MD   9 months ago Essential hypertension   [facility] Glean Hess, MD   1 year ago Annual physical exam   [facility] Glean Hess, MD   1 year ago Essential hypertension   [facility] Glean Hess, MD      Future Appointments            In 3 months [name], MD [facility] [place]   In 9 months [name], MD [facility], [facility]

## 2021-11-28 ENCOUNTER — Ambulatory Visit: Payer: Self-pay | Admitting: *Deleted

## 2021-11-28 ENCOUNTER — Other Ambulatory Visit: Payer: Self-pay

## 2021-11-28 DIAGNOSIS — I10 Essential (primary) hypertension: Secondary | ICD-10-CM

## 2021-11-28 DIAGNOSIS — F325 Major depressive disorder, single episode, in full remission: Secondary | ICD-10-CM

## 2021-11-28 MED ORDER — BISOPROLOL FUMARATE 5 MG PO TABS: 5.0000 mg | ORAL_TABLET | Freq: Two times a day (BID) | ORAL | 0 refills | Status: DC

## 2021-11-28 MED ORDER — BUPROPION HCL ER (SR) 150 MG PO TB12: 300.0000 mg | ORAL_TABLET | Freq: Every day | ORAL | 0 refills | Status: DC

## 2021-11-28 NOTE — Telephone Encounter (Signed)
Summary: discuss medication   Pt states the directions on bisoprolol (ZEBETA) 5 MG tablet, triamterene-hydrochlorothiazide (MAXZIDE-25) 37.5-25 MG tablet, and buPROPion (WELLBUTRIN SR) 150 MG 12 hr tablet are incorrect   Please fu w/ pt     Bisoprolol- needs new Rx- patient states it was filled recently- but only for #30- not correct- she takes bid and needs that corrected for 90 day supply Bupropion- only received #90- she takes bid- needs #180 for 90 day supply ( Maxzide is correct) Patient needs Rx corrected- they were sent with incorrect amounts and she is about to run out  Reason for Disposition  [1] Caller has NON-URGENT medicine question about med that PCP prescribed AND [2] triager unable to answer question  Answer Assessment - Initial Assessment Questions 1. DRUG NAME: "What medicine do you need to have refilled?"     Bisoprolol and Bupropion were sent to pharmacy with wrong # and patient is running out. She normally gets 90 day supply- please correct  Protocols used: Medication Refill and Renewal Call-A-AH

## 2021-11-28 NOTE — Telephone Encounter (Signed)
Medication sent for 90 refill. PC to pt who voiced understanding

## 2022-02-07 ENCOUNTER — Other Ambulatory Visit: Payer: Self-pay | Admitting: Internal Medicine

## 2022-02-07 DIAGNOSIS — K219 Gastro-esophageal reflux disease without esophagitis: Secondary | ICD-10-CM

## 2022-02-07 NOTE — Telephone Encounter (Signed)
Requested Prescriptions  Pending Prescriptions Disp Refills   pantoprazole (PROTONIX) 40 MG tablet [Pharmacy Med Name: PANTOPRAZOLE SODIUM 40 MG DR TAB] 180 tablet 1    Sig: TAKE 1 TABLET BY MOUTH TWICE A DAY     Gastroenterology: Proton Pump Inhibitors Passed - 02/07/2022  5:33 PM      Passed - Valid encounter within last 12 months    Recent Outpatient Visits           5 months ago Encounter for Medicare annual wellness exam   [facility], Jesse Sans, MD   5 months ago Annual physical exam   [facility] [facility], Jesse Sans, MD   12 months ago Essential hypertension   [place] [facility], Jesse Sans, MD   1 year ago Annual physical exam   [facility] at [facility], Jesse Sans, MD   1 year ago Essential hypertension    [facility] Medicine at [facility], Jesse Sans, MD       Future Appointments             In 6 days [name], MD [facility] at [facility]   In 6 months Army Melia, Jesse Sans, MD [place] [facility] at [facility], [facility]

## 2022-02-13 ENCOUNTER — Ambulatory Visit (INDEPENDENT_AMBULATORY_CARE_PROVIDER_SITE_OTHER): Payer: PPO | Admitting: Internal Medicine

## 2022-02-13 ENCOUNTER — Other Ambulatory Visit: Payer: Self-pay | Admitting: Internal Medicine

## 2022-02-13 ENCOUNTER — Encounter: Payer: Self-pay | Admitting: Internal Medicine

## 2022-02-13 VITALS — BP 180/82 | HR 70 | Ht 62.0 in | Wt 138.0 lb

## 2022-02-13 DIAGNOSIS — Z1231 Encounter for screening mammogram for malignant neoplasm of breast: Secondary | ICD-10-CM

## 2022-02-13 DIAGNOSIS — I10 Essential (primary) hypertension: Secondary | ICD-10-CM

## 2022-02-13 DIAGNOSIS — M5387 Other specified dorsopathies, lumbosacral region: Secondary | ICD-10-CM

## 2022-02-13 DIAGNOSIS — F325 Major depressive disorder, single episode, in full remission: Secondary | ICD-10-CM | POA: Diagnosis not present

## 2022-02-13 DIAGNOSIS — M5442 Lumbago with sciatica, left side: Secondary | ICD-10-CM

## 2022-02-13 DIAGNOSIS — N1831 Chronic kidney disease, stage 3a: Secondary | ICD-10-CM | POA: Diagnosis not present

## 2022-02-13 MED ORDER — PREDNISONE 10 MG PO TABS: 10.0000 mg | ORAL_TABLET | ORAL | 0 refills | Status: AC

## 2022-02-13 NOTE — Progress Notes (Signed)
Date:  02/13/2022   Name:  Jodi Bartlett   DOB:  07-13-1935   MRN:  [id number]   Chief Complaint: Hypertension  Hypertension This is a chronic problem. The problem is controlled. Pertinent negatives include no chest pain, headaches, palpitations or shortness of breath. Past treatments include beta blockers and diuretics. The current treatment provides significant improvement. Hypertensive end-organ damage includes kidney disease. There is no history of CAD/MI or CVA.  Depression        This is a chronic problem.The problem is unchanged.  Associated symptoms include no fatigue and no headaches.  Treatments tried: xanax, bupropion, elavil. Back Pain This is a chronic problem. Progression since onset: but now with new left sided sciatica. The quality of the pain is described as aching and cramping. The pain radiates to the left foot. The pain is mild. Pertinent negatives include no abdominal pain, bladder incontinence, bowel incontinence, chest pain, headaches or weakness. Treatments tried: tylenol and gabapentin.    Lab Results  Component Value Date   NA 140 08/13/2021   K 4.6 08/13/2021   CO2 24 08/13/2021   GLUCOSE 110 (H) 08/13/2021   BUN 23 08/13/2021   CREATININE 1.33 (H) 08/13/2021   CALCIUM 9.7 08/13/2021   EGFR 40 (L) 08/13/2021   GFRNONAA 44 (L) 01/11/2020   Lab Results  Component Value Date   CHOL 180 08/13/2021   HDL 44 08/13/2021   LDLCALC 116 (H) 08/13/2021   TRIG 112 08/13/2021   CHOLHDL 4.1 08/13/2021   Lab Results  Component Value Date   TSH 4.270 08/13/2021   No results found for: "HGBA1C" Lab Results  Component Value Date   WBC 8.4 08/13/2021   HGB 12.4 08/13/2021   HCT 36.2 08/13/2021   MCV 97 08/13/2021   PLT 385 08/13/2021   Lab Results  Component Value Date   ALT 15 08/13/2021   AST 19 08/13/2021   ALKPHOS 70 08/13/2021   BILITOT 0.3 08/13/2021   Lab Results  Component Value Date   VD25OH 49.5 08/13/2021     Review of Systems   Constitutional:  Negative for fatigue and unexpected weight change.  HENT:  Negative for nosebleeds.   Eyes:  Negative for visual disturbance.  Respiratory:  Negative for cough, chest tightness, shortness of breath and wheezing.   Cardiovascular:  Negative for chest pain, palpitations and leg swelling.  Gastrointestinal:  Negative for abdominal pain, bowel incontinence, constipation and diarrhea.  Genitourinary:  Negative for bladder incontinence.  Musculoskeletal:  Positive for back pain. Negative for gait problem.  Neurological:  Negative for dizziness, weakness, light-headedness and headaches.  Psychiatric/Behavioral:  Positive for depression and sleep disturbance. Negative for dysphoric mood. The patient is not nervous/anxious.     Patient Active Problem List   Diagnosis Date Noted   Osteoporosis 06/20/2020   Chronic kidney disease, stage 3a ([place]) 01/12/2020   Migraine without aura and without status migrainosus, not intractable 02/03/2019   Inguinal hernia bilateral, non-recurrent 05/25/2018   Hiatal hernia 05/21/2018   Aortic atherosclerosis ([place]) 05/21/2018   Gastroesophageal reflux disease without esophagitis 12/31/2017   Degenerative lumbar spinal stenosis 01/01/2016   Primary osteoarthritis of both hips 01/01/2016   Insomnia 07/17/2012   Chest pain 06/23/2012   Bronchiectasis ([place]) 03/02/2012   Anemia 11/13/2011   Vitamin D deficiency 10/11/2008   B12 deficiency 10/04/2008   IBS 10/01/2007   Major depression, single episode, in complete remission ([place]) 07/31/2007   Essential hypertension 07/31/2007   Chronic back pain  07/31/2007   History of colonic polyps 07/31/2007    No Known Allergies  Past Surgical History:  Procedure Laterality Date   ABDOMINAL HYSTERECTOMY  1962   ovarian cancer   APPENDECTOMY     breast biopsy- benign     BREAST CYST EXCISION Left    bunion/morton's neuroma  4/07   right foot    carpal tunnel release-right     CHOLECYSTECTOMY  1994    colitis transfusion     hosp at 25   COLONOSCOPY     very difficul- supp with BE past   EGD tight ues sphincter  7/09   with dialtion   FOOT SURGERY  10/09   TMT fusion and bunionectomy   [place]     after ccy   left knee surgery     left shoulder replacement     stress cardiolite  9/10   VIDEO BRONCHOSCOPY  04/22/2012   Procedure: VIDEO BRONCHOSCOPY WITH FLUORO;  Surgeon: Juanito Doom, MD;  Location: [facility];  Service: Cardiopulmonary;  Laterality: Bilateral;    Social History   Tobacco Use   Smoking status: Never   Smokeless tobacco: Never   Tobacco comments:    smoking cessation materials not required  Vaping Use   Vaping Use: Never used  Substance Use Topics   Alcohol use: Yes    Comment: social wine    Drug use: No     Medication list has been reviewed and updated.  Current Meds  Medication Sig   acetaminophen (TYLENOL) 500 MG tablet Take 500 mg by mouth every 6 (six) hours as needed.   ALPRAZolam (XANAX) 0.25 MG tablet Take 1 tablet (0.25 mg total) by mouth at bedtime as needed for anxiety.   amitriptyline (ELAVIL) 50 MG tablet Take 1 tablet (50 mg total) by mouth at bedtime.   bisoprolol (ZEBETA) 5 MG tablet Take 1 tablet (5 mg total) by mouth in the morning and at bedtime.   buPROPion (WELLBUTRIN SR) 150 MG 12 hr tablet Take 2 tablets (300 mg total) by mouth daily.   Cholecalciferol (VITAMIN D3) 2000 units TABS Take by mouth.   gabapentin (NEURONTIN) 100 MG capsule Take 2 capsules (200 mg total) by mouth at bedtime.   pantoprazole (PROTONIX) 40 MG tablet TAKE 1 TABLET BY MOUTH TWICE A DAY   predniSONE (DELTASONE) 10 MG tablet Take 1 tablet (10 mg total) by mouth as directed for 6 days. Take 6,5,4,3,2,1 then stop   triamterene-hydrochlorothiazide (MAXZIDE-25) 37.5-25 MG tablet TAKE 1 TABLET BY MOUTH DAILY   vitamin B-12 (CYANOCOBALAMIN) 1000 MCG tablet Take 1,000 mcg by mouth daily.       02/13/2022    10:45 AM 08/13/2021   10:17 AM 02/12/2021    1:36 PM 06/14/2020    9:50 AM  GAD 7 : Generalized Anxiety Score  Nervous, Anxious, on Edge 0 0 0 0  Control/stop worrying 0 0 0 0  Worry too much - different things 0 0 0 0  Trouble relaxing 0 0 0 0  Restless 0 0 0 0  Easily annoyed or irritable 0 0 0 0  Afraid - awful might happen 0 0 0 0  Total GAD 7 Score 0 0 0 0  Anxiety Difficulty Not difficult at all Not difficult at all Not difficult at all        02/13/2022   10:45 AM 08/13/2021   10:17 AM 02/12/2021    1:36 PM  Depression screen PHQ 2/9  Decreased Interest 0 0 0  Down, Depressed, Hopeless 0 0 0  PHQ - 2 Score 0 0 0  Altered sleeping 0 1 0  Tired, decreased energy 0 0 0  Change in appetite 0 0 0  Feeling bad or failure about yourself  0 0 0  Trouble concentrating 0 0 0  Moving slowly or fidgety/restless 0 0 0  Suicidal thoughts 0 0 0  PHQ-9 Score 0 1 0  Difficult doing work/chores Not difficult at all Not difficult at all Not difficult at all    BP Readings from Last 3 Encounters:  02/13/22 (!) 180/82  08/13/21 (!) 130/58  08/13/21 (!) 130/58    Physical Exam Vitals and nursing note reviewed.  Constitutional:      General: She is not in acute distress.    Appearance: She is well-developed.  HENT:     Head: Normocephalic and atraumatic.  Cardiovascular:     Rate and Rhythm: Normal rate and regular rhythm.  Pulmonary:     Effort: Pulmonary effort is normal. No respiratory distress.     Breath sounds: No wheezing or rhonchi.  Musculoskeletal:     Cervical back: Normal range of motion.     Lumbar back: Positive left straight leg raise test. Negative right straight leg raise test.     Right lower leg: No edema.     Left lower leg: No edema.  Skin:    General: Skin is warm and dry.     Findings: No rash.  Neurological:     Mental Status: She is alert and oriented to person, place, and time.  Psychiatric:        Mood and Affect: Mood normal.        Behavior:  Behavior normal.     Wt Readings from Last 3 Encounters:  02/13/22 138 lb (62.6 kg)  08/13/21 136 lb (61.7 kg)  08/13/21 136 lb (61.7 kg)    BP (!) 180/82 (BP Location: Right Arm, Cuff Size: Normal)   Pulse 70   Ht _0  (1.575 m)   Wt 138 lb (62.6 kg)   SpO2 95%   BMI 25.24 kg/m   Assessment and Plan: 1. Essential hypertension Much higher recently Following a low salt diet and taking medications as instructed May be due to recent back pain so will recheck in 2 weeks.  2. Sciatica of left side associated with disorder of lumbosacral spine - predniSONE (DELTASONE) 10 MG tablet; Take 1 tablet (10 mg total) by mouth as directed for 6 days. Take 6,5,4,3,2,1 then stop  Dispense: 21 tablet; Refill: 0  3. Major depression, single episode, in complete remission ([place]) Clinically stable on current regimen with good control of symptoms, No SI or HI. Will continue current therapy.  4. Chronic kidney disease, stage 3a (H[facility]) - Basic metabolic panel  5. Encounter for screening mammogram for breast cancer Schedule Mammogram already ordered   Partially dictated using Dragon software. Any errors are unintentional.  Halina Maidens, MD [facility]  02/13/2022

## 2022-02-13 NOTE — Patient Instructions (Signed)
Schedule your Mammogram.  Call [facility] to schedule your mammogram at [phone].

## 2022-02-14 LAB — BASIC METABOLIC PANEL
BUN/Creatinine Ratio: 17 (ref 12–28)
BUN: 19 mg/dL (ref 8–27)
CO2: 23 mmol/L (ref 20–29)
Calcium: 9.8 mg/dL (ref 8.7–10.3)
Chloride: 103 mmol/L (ref 96–106)
Creatinine, Ser: 1.13 mg/dL — ABNORMAL HIGH (ref 0.57–1.00)
Glucose: 93 mg/dL (ref 70–99)
Potassium: 4 mmol/L (ref 3.5–5.2)
Sodium: 141 mmol/L (ref 134–144)
eGFR: 48 mL/min/{1.73_m2} — ABNORMAL LOW (ref >59)

## 2022-02-14 NOTE — Telephone Encounter (Signed)
Requested Prescriptions  Pending Prescriptions Disp Refills   gabapentin (NEURONTIN) 100 MG capsule [Pharmacy Med Name: GABAPENTIN 100 MG CAP] 180 capsule 1    Sig: TAKE 2 CAPSULES BY MOUTH AT BEDTIME     Neurology: Anticonvulsants - gabapentin Failed - 02/13/2022  2:42 PM      Failed - Cr in normal range and within 360 days    Creatinine  Date Value Ref Range Status  12/10/2011 0.95 0.60 - 1.30 mg/dL Final   Creatinine, Ser  Date Value Ref Range Status  02/13/2022 1.13 (H) 0.57 - 1.00 mg/dL Final         Passed - Completed PHQ-2 or PHQ-9 in the last 360 days      Passed - Valid encounter within last 12 months    Recent Outpatient Visits           Yesterday Essential hypertension   [facility] at [facility], Jesse Sans, MD   6 months ago Encounter for Medicare annual wellness exam   [facility] at [facility], Jesse Sans, MD   6 months ago Annual physical exam   [facility] at [facility], Jesse Sans, MD   1 year ago Essential hypertension    Primary Care and Sports Medicine at [facility], Jesse Sans, MD   1 year ago Annual physical exam   [facility] at [facility], Jesse Sans, MD       Future Appointments             In 4 weeks [name], MD [facility] and [facility] Medicine at [facility], [facility]   In 6 months Army Melia, Jesse Sans, MD [place] [facility] at [facility], [facility]

## 2022-02-18 ENCOUNTER — Telehealth: Payer: Self-pay | Admitting: Internal Medicine

## 2022-02-18 NOTE — Telephone Encounter (Signed)
Called pt left VM to call back. Told pt to ask for Me Randa Evens) when she calls back.  KP

## 2022-02-18 NOTE — Telephone Encounter (Signed)
Copied from [place] [id number]. Topic: Appointment Scheduling - Scheduling Inquiry for Clinic >> Feb 18, 2022  8:52 AM Erskine Squibb wrote: Reason for CRM: The patient called in stating she was concerned her appt is out a month and not 2 weeks. That is the soonest that was available but she was told by her provider that she would like to see her back closer to 2 weeks. She was put on a wait list and is currently on prednisone and she is taking her last pill tomorrow. She states she hasn't noticed any difference at all when it comes to the pain. Patient states if by some reason she doesn't make it to the phone can a message be left.Please assist patient further

## 2022-02-19 ENCOUNTER — Ambulatory Visit: Payer: Self-pay | Admitting: *Deleted

## 2022-02-19 NOTE — Telephone Encounter (Signed)
Reason for Disposition  Systolic BP  >= 025 OR Diastolic >= 852  Answer Assessment - Initial Assessment Questions 1. BLOOD PRESSURE: "What is the blood pressure?" "Did you take at least two measurements 5 minutes apart?"     188/74 this morning.  Been monitoring my BP at Dr. Gaspar Cola direction.  I saw her last Wed. And my BP was a little high in the office.   She thought it might be from the fact I'm having a lot of pain in my hip, down my leg and to my foot from lower back pain.   My right arm is high which is what it did last Wed. In office.   She put me on prednisone to see if it would bring my BP down by lowering my pain in my hip.     I'm having back pain in hip and down my leg and into my foot.  If I stand still it's worse.   Prednisone has not helped with the hip pain.     I've had a dull headache but no dizziness.  I have an appt. To see Dr. Army Melia but I was concerned about the top number of my BP remaining elevated and the prednisone not helping with the back and hip pain.  I let pt know I would send Dr. Army Melia her message regarding the elevated BP and pain not being better with the prednisone and see what Dr. Army Melia wanted pt. To do.   Pt. Was agreeable to this plan.  2. ONSET: "When did you take your blood pressure?"     This morning 3. HOW: "How did you take your blood pressure?" (e.g., automatic home BP monitor, visiting nurse)     Wrist  monitor She is holding her arm correctly.   She brought the machine in last week and it was reading high in the office compared to the office BP monitor.   The nurse changed the batteries for me and made sure I was using it correctly holding it over my heart and all.   It was still reading higher than the office machine. 4. HISTORY: "Do you have a history of high blood pressure?"     Yes 5. MEDICINES: "Are you taking any medicines for blood pressure?" "Have you missed any doses recently?"     I take 3 meds for it. 6. OTHER SYMPTOMS:  "Do you have any symptoms?" (e.g., blurred vision, chest pain, difficulty breathing, headache, weakness)     Mild headache, no dizziness. 7. PREGNANCY: "Is there any chance you are pregnant?" "When was your last menstrual period?"     N/A  Protocols used: Blood Pressure - High-A-AH  Chief Complaint: Top number of BP is elevated still from visit in office last Wed.    Also pain not improved with the prednisone in her back, hip and leg. Symptoms: Mild headaches.   Pain unchanged in lower back, hip and leg Frequency: Since being seen in office last Wed. By Dr. Army Melia Pertinent Negatives: Patient denies dizziness Disposition: '[]'$ ED /'[]'$ Urgent Care (no appt availability in office) / '[]'$ Appointment(In office/virtual)/ '[]'$  [place] [facility]/ '[]'$ Home Care/ '[]'$ Refused Recommended Disposition /'[]'$ [place] [facility]/ '[x]'$  Follow-up with PCP Additional Notes: I have sent a message to Dr. Army Melia.   Pt was agreeable to someone calling her back with Dr. Gaspar Cola recommendation.

## 2022-02-19 NOTE — Telephone Encounter (Signed)
Called pt schedule appt.  KP

## 2022-02-19 NOTE — Telephone Encounter (Signed)
Called pt scheduled appt for 2 weeks.  KP

## 2022-02-28 ENCOUNTER — Ambulatory Visit (INDEPENDENT_AMBULATORY_CARE_PROVIDER_SITE_OTHER): Payer: PPO | Admitting: Internal Medicine

## 2022-02-28 ENCOUNTER — Encounter: Payer: Self-pay | Admitting: Internal Medicine

## 2022-02-28 VITALS — BP 128/68 | HR 76 | Ht 62.0 in | Wt 136.0 lb

## 2022-02-28 DIAGNOSIS — M5387 Other specified dorsopathies, lumbosacral region: Secondary | ICD-10-CM

## 2022-02-28 DIAGNOSIS — I10 Essential (primary) hypertension: Secondary | ICD-10-CM

## 2022-02-28 DIAGNOSIS — Z23 Encounter for immunization: Secondary | ICD-10-CM | POA: Diagnosis not present

## 2022-02-28 DIAGNOSIS — F325 Major depressive disorder, single episode, in full remission: Secondary | ICD-10-CM

## 2022-02-28 MED ORDER — BUPROPION HCL ER (SR) 150 MG PO TB12: 300.0000 mg | ORAL_TABLET | Freq: Every day | ORAL | 0 refills | Status: DC

## 2022-02-28 MED ORDER — BISOPROLOL FUMARATE 5 MG PO TABS: 5.0000 mg | ORAL_TABLET | Freq: Two times a day (BID) | ORAL | 0 refills | Status: DC

## 2022-02-28 NOTE — Progress Notes (Signed)
Date:  02/28/2022   Name:  Jodi Bartlett   DOB:  [birth year]   MRN:  [id number]   Chief Complaint: Hypertension and Sciatica (Left leg, still painful, hurts more when standing still )  Hypertension This is a chronic problem. The problem has been waxing and waning since onset. Pertinent negatives include no chest pain, headaches, malaise/fatigue, neck pain, palpitations, peripheral edema or shortness of breath. There are no associated agents to hypertension. Past treatments include diuretics and beta blockers. There are no compliance problems.  Hypertensive end-organ damage includes kidney disease. There is no history of CAD/MI or CVA.  Leg Pain  There was no injury mechanism. The pain is present in the left leg. The pain is at a severity of 8/10. The pain is mild. The pain has been Intermittent since onset. Associated symptoms include numbness and tingling. She reports no foreign bodies present. The symptoms are aggravated by weight bearing. She has tried acetaminophen (and gabapentin 200 mg hs) for the symptoms. The treatment provided mild relief.    Lab Results  Component Value Date   NA 141 02/13/2022   K 4.0 02/13/2022   CO2 23 02/13/2022   GLUCOSE 93 02/13/2022   BUN 19 02/13/2022   CREATININE 1.13 (H) 02/13/2022   CALCIUM 9.8 02/13/2022   EGFR 48 (L) 02/13/2022   GFRNONAA 44 (L) 01/11/2020   Lab Results  Component Value Date   CHOL 180 08/13/2021   HDL 44 08/13/2021   LDLCALC 116 (H) 08/13/2021   TRIG 112 08/13/2021   CHOLHDL 4.1 08/13/2021   Lab Results  Component Value Date   TSH 4.270 08/13/2021   No results found for: "HGBA1C" Lab Results  Component Value Date   WBC 8.4 08/13/2021   HGB 12.4 08/13/2021   HCT 36.2 08/13/2021   MCV 97 08/13/2021   PLT 385 08/13/2021   Lab Results  Component Value Date   ALT 15 08/13/2021   AST 19 08/13/2021   ALKPHOS 70 08/13/2021   BILITOT 0.3 08/13/2021   Lab Results  Component Value Date   VD25OH 49.5 08/13/2021      Review of Systems  Constitutional:  Negative for chills, fatigue, fever and malaise/fatigue.  Respiratory:  Negative for cough, chest tightness and shortness of breath.   Cardiovascular:  Negative for chest pain and palpitations.  Musculoskeletal:  Positive for arthralgias and back pain. Negative for neck pain.  Neurological:  Positive for tingling and numbness. Negative for seizures, syncope, weakness and headaches.  Psychiatric/Behavioral:  Positive for sleep disturbance. Negative for dysphoric mood. The patient is not nervous/anxious.     Patient Active Problem List   Diagnosis Date Noted   Osteoporosis 06/20/2020   Chronic kidney disease, stage 3a ([place]) 01/12/2020   Migraine without aura and without status migrainosus, not intractable 02/03/2019   Inguinal hernia bilateral, non-recurrent 05/25/2018   Hiatal hernia 05/21/2018   Aortic atherosclerosis ([place]) 05/21/2018   Gastroesophageal reflux disease without esophagitis 12/31/2017   Degenerative lumbar spinal stenosis 01/01/2016   Primary osteoarthritis of both hips 01/01/2016   Insomnia 07/17/2012   Chest pain 06/23/2012   Bronchiectasis ([place]) 03/02/2012   Anemia 11/13/2011   Vitamin D deficiency 10/11/2008   B12 deficiency 10/04/2008   IBS 10/01/2007   Major depression, single episode, in complete remission ([place]) 07/31/2007   Essential hypertension 07/31/2007   Chronic back pain 07/31/2007   History of colonic polyps 07/31/2007    No Known Allergies  Past Surgical History:  Procedure Laterality Date  ABDOMINAL HYSTERECTOMY  1962   ovarian cancer   APPENDECTOMY     breast biopsy- benign     BREAST CYST EXCISION Left    bunion/morton's neuroma  4/07   right foot    carpal tunnel release-right     CHOLECYSTECTOMY  1994   colitis transfusion     hosp at 25   COLONOSCOPY     very difficul- supp with BE past   EGD tight ues sphincter  7/09   with dialtion   FOOT SURGERY  10/09   TMT fusion and bunionectomy    [place]     after ccy   left knee surgery     left shoulder replacement     stress cardiolite  9/10   VIDEO BRONCHOSCOPY  04/22/2012   Procedure: VIDEO BRONCHOSCOPY WITH FLUORO;  Surgeon: Juanito Doom, MD;  Location: [facility];  Service: Cardiopulmonary;  Laterality: Bilateral;    Social History   Tobacco Use   Smoking status: Never   Smokeless tobacco: Never   Tobacco comments:    smoking cessation materials not required  Vaping Use   Vaping Use: Never used  Substance Use Topics   Alcohol use: Yes    Comment: social wine    Drug use: No     Medication list has been reviewed and updated.  Current Meds  Medication Sig   acetaminophen (TYLENOL) 500 MG tablet Take 500 mg by mouth every 6 (six) hours as needed.   ALPRAZolam (XANAX) 0.25 MG tablet Take 1 tablet (0.25 mg total) by mouth at bedtime as needed for anxiety.   amitriptyline (ELAVIL) 50 MG tablet Take 1 tablet (50 mg total) by mouth at bedtime.   bisoprolol (ZEBETA) 5 MG tablet Take 1 tablet (5 mg total) by mouth in the morning and at bedtime.   buPROPion (WELLBUTRIN SR) 150 MG 12 hr tablet Take 2 tablets (300 mg total) by mouth daily.   Cholecalciferol (VITAMIN D3) 2000 units TABS Take by mouth.   gabapentin (NEURONTIN) 100 MG capsule TAKE 2 CAPSULES BY MOUTH AT BEDTIME   pantoprazole (PROTONIX) 40 MG tablet TAKE 1 TABLET BY MOUTH TWICE A DAY   triamterene-hydrochlorothiazide (MAXZIDE-25) 37.5-25 MG tablet TAKE 1 TABLET BY MOUTH DAILY   vitamin B-12 (CYANOCOBALAMIN) 1000 MCG tablet Take 1,000 mcg by mouth daily.       02/13/2022   10:45 AM 08/13/2021   10:17 AM 02/12/2021    1:36 PM 06/14/2020    9:50 AM  GAD 7 : Generalized Anxiety Score  Nervous, Anxious, on Edge 0 0 0 0  Control/stop worrying 0 0 0 0  Worry too much - different things 0 0 0 0  Trouble relaxing 0 0 0 0  Restless 0 0 0 0  Easily annoyed or irritable 0 0 0 0  Afraid - awful might happen 0 0 0  0  Total GAD 7 Score 0 0 0 0  Anxiety Difficulty Not difficult at all Not difficult at all Not difficult at all        02/13/2022   10:45 AM 08/13/2021   10:17 AM 02/12/2021    1:36 PM  Depression screen PHQ 2/9  Decreased Interest 0 0 0  Down, Depressed, Hopeless 0 0 0  PHQ - 2 Score 0 0 0  Altered sleeping 0 1 0  Tired, decreased energy 0 0 0  Change in appetite 0 0 0  Feeling bad or failure about  yourself  0 0 0  Trouble concentrating 0 0 0  Moving slowly or fidgety/restless 0 0 0  Suicidal thoughts 0 0 0  PHQ-9 Score 0 1 0  Difficult doing work/chores Not difficult at all Not difficult at all Not difficult at all    BP Readings from Last 3 Encounters:  02/28/22 128/68  02/13/22 (!) 180/82  08/13/21 (!) 130/58    Physical Exam Vitals and nursing note reviewed.  Constitutional:      General: She is not in acute distress.    Appearance: Normal appearance. She is well-developed.  HENT:     Head: Normocephalic and atraumatic.  Cardiovascular:     Rate and Rhythm: Normal rate and regular rhythm.  Pulmonary:     Effort: Pulmonary effort is normal. No respiratory distress.     Breath sounds: No wheezing or rhonchi.  Musculoskeletal:     Cervical back: Normal range of motion.     Right lower leg: No edema.     Left lower leg: No edema.  Skin:    General: Skin is warm and dry.     Findings: No rash.  Neurological:     General: No focal deficit present.     Mental Status: She is alert and oriented to person, place, and time.     Gait: Gait normal.  Psychiatric:        Mood and Affect: Mood normal.        Behavior: Behavior normal.     Wt Readings from Last 3 Encounters:  02/28/22 136 lb (61.7 kg)  02/13/22 138 lb (62.6 kg)  08/13/21 136 lb (61.7 kg)    BP 128/68   Pulse 76   Ht _0  (1.575 m)   Wt 136 lb (61.7 kg)   BMI 24.87 kg/m   Assessment and Plan: 1. Essential hypertension Clinically stable exam with well controlled BP. Tolerating medications  without side effects at this time. Pt to continue current regimen and low sodium diet; benefits of regular exercise as able discussed. - bisoprolol (ZEBETA) 5 MG tablet; Take 1 tablet (5 mg total) by mouth in the morning and at bedtime.  Dispense: 180 tablet; Refill: 0  2. Sciatica of left side associated with disorder of lumbosacral spine Minimal benefit from steroid taper Will increase Gabapentin to 5 per day - 1 AM, 1 PM and 2-3 HS.  Call for new Rx if needed.  3. Major depression, single episode, in complete remission ([place]) Clinically stable on current regimen with good control of symptoms, No SI or HI. Will continue current therapy. - buPROPion (WELLBUTRIN SR) 150 MG 12 hr tablet; Take 2 tablets (300 mg total) by mouth daily.  Dispense: 180 tablet; Refill: 0   Partially dictated using Editor, commissioning. Any errors are unintentional.  Halina Maidens, MD [facility]  02/28/2022

## 2022-02-28 NOTE — Patient Instructions (Signed)
Try gabapentin 100 mg in the AM,  100 mg in the mid afternoon and then 2-3 at bedtime.

## 2022-03-15 ENCOUNTER — Ambulatory Visit: Payer: PPO | Admitting: Internal Medicine

## 2022-03-19 ENCOUNTER — Other Ambulatory Visit: Payer: Self-pay | Admitting: Internal Medicine

## 2022-03-19 DIAGNOSIS — F325 Major depressive disorder, single episode, in full remission: Secondary | ICD-10-CM

## 2022-05-20 ENCOUNTER — Other Ambulatory Visit: Payer: Self-pay

## 2022-05-20 ENCOUNTER — Telehealth: Payer: Self-pay | Admitting: Internal Medicine

## 2022-05-20 DIAGNOSIS — M5442 Lumbago with sciatica, left side: Secondary | ICD-10-CM

## 2022-05-20 DIAGNOSIS — F325 Major depressive disorder, single episode, in full remission: Secondary | ICD-10-CM

## 2022-05-20 DIAGNOSIS — I10 Essential (primary) hypertension: Secondary | ICD-10-CM

## 2022-05-20 DIAGNOSIS — K219 Gastro-esophageal reflux disease without esophagitis: Secondary | ICD-10-CM

## 2022-05-20 MED ORDER — PANTOPRAZOLE SODIUM 40 MG PO TBEC: 40.0000 mg | DELAYED_RELEASE_TABLET | Freq: Two times a day (BID) | ORAL | 1 refills | Status: DC

## 2022-05-20 MED ORDER — TRIAMTERENE-HCTZ 37.5-25 MG PO TABS: 1.0000 | ORAL_TABLET | Freq: Every day | ORAL | 1 refills | Status: DC

## 2022-05-20 MED ORDER — AMITRIPTYLINE HCL 50 MG PO TABS: 50.0000 mg | ORAL_TABLET | Freq: Every day | ORAL | 1 refills | Status: DC

## 2022-05-20 MED ORDER — GABAPENTIN 100 MG PO CAPS: 200.0000 mg | ORAL_CAPSULE | Freq: Every day | ORAL | 1 refills | Status: DC

## 2022-05-20 NOTE — Telephone Encounter (Signed)
Copied from [place] [id number]. Topic: General - Other >> May 20, 2022  2:59 PM [place] C wrote: Reason for CRM: The patient would like to speak with C. Mc[place]doo when possible   The patient has recently changed their insurance and would like to further discuss their prescriptions and pharmacy selections   Please contact when possible

## 2022-05-20 NOTE — Telephone Encounter (Signed)
Patient [facility] to [facility] [place]. Sent meds.  Jodi Bartlett

## 2022-05-27 ENCOUNTER — Ambulatory Visit
Admission: RE | Admit: 2022-05-27 | Discharge: 2022-05-27 | Disposition: A | Discharge status: Home / Self Care | Payer: HMO | Attending: Internal Medicine | Admitting: Internal Medicine

## 2022-05-27 ENCOUNTER — Ambulatory Visit
Admission: RE | Admit: 2022-05-27 | Discharge: 2022-05-27 | Disposition: A | Discharge status: Home / Self Care | Payer: HMO | Source: Ambulatory Visit | Attending: Internal Medicine | Admitting: Internal Medicine

## 2022-05-27 ENCOUNTER — Encounter: Payer: Self-pay | Admitting: Internal Medicine

## 2022-05-27 ENCOUNTER — Ambulatory Visit (INDEPENDENT_AMBULATORY_CARE_PROVIDER_SITE_OTHER): Payer: HMO | Admitting: Internal Medicine

## 2022-05-27 VITALS — BP 134/72 | HR 78 | Temp 97.8°F | Ht 62.0 in | Wt 134.4 lb

## 2022-05-27 DIAGNOSIS — I700 Atherosclerosis of aorta: Secondary | ICD-10-CM | POA: Diagnosis not present

## 2022-05-27 DIAGNOSIS — R197 Diarrhea, unspecified: Secondary | ICD-10-CM | POA: Diagnosis not present

## 2022-05-27 DIAGNOSIS — R053 Chronic cough: Secondary | ICD-10-CM

## 2022-05-27 DIAGNOSIS — J0100 Acute maxillary sinusitis, unspecified: Secondary | ICD-10-CM | POA: Diagnosis not present

## 2022-05-27 MED ORDER — AMOXICILLIN-POT CLAVULANATE 875-125 MG PO TABS: 1.0000 | ORAL_TABLET | Freq: Two times a day (BID) | ORAL | 0 refills | Status: AC

## 2022-05-27 NOTE — Progress Notes (Signed)
Date:  05/27/2022   Name:  Jodi Bartlett   DOB:  06-13-1935   MRN:  [id number]   Chief Complaint: Cough  Cough This is a new problem. The current episode started 1 to 4 weeks ago (low grade sinus congestion; worsened over the weekend). The problem has been gradually worsening. The cough is Productive of sputum (greeen mucous). Associated symptoms include chills, headaches, nasal congestion, postnasal drip and a sore throat. Pertinent negatives include no chest pain, fever, shortness of breath or wheezing. She has tried OTC cough suppressant for the symptoms. The treatment provided no relief. Her past medical history is significant for bronchiectasis.  Diarrhea  This is a new problem. The current episode started 1 to 4 weeks ago. The problem occurs 2 to 4 times per day. The stool consistency is described as Watery and mucous. The patient states that diarrhea awakens her from sleep. Associated symptoms include chills, coughing and headaches. Pertinent negatives include no fever or vomiting. There are no known risk factors. She has tried nothing for the symptoms.    Lab Results  Component Value Date   NA 141 02/13/2022   K 4.0 02/13/2022   CO2 23 02/13/2022   GLUCOSE 93 02/13/2022   BUN 19 02/13/2022   CREATININE 1.13 (H) 02/13/2022   CALCIUM 9.8 02/13/2022   EGFR 48 (L) 02/13/2022   GFRNONAA 44 (L) 01/11/2020   Lab Results  Component Value Date   CHOL 180 08/13/2021   HDL 44 08/13/2021   LDLCALC 116 (H) 08/13/2021   TRIG 112 08/13/2021   CHOLHDL 4.1 08/13/2021   Lab Results  Component Value Date   TSH 4.270 08/13/2021   No results found for: "HGBA1C" Lab Results  Component Value Date   WBC 8.4 08/13/2021   HGB 12.4 08/13/2021   HCT 36.2 08/13/2021   MCV 97 08/13/2021   PLT 385 08/13/2021   Lab Results  Component Value Date   ALT 15 08/13/2021   AST 19 08/13/2021   ALKPHOS 70 08/13/2021   BILITOT 0.3 08/13/2021   Lab Results  Component Value Date   VD25OH 49.5  08/13/2021     Review of Systems  Constitutional:  Positive for appetite change, chills and fatigue. Negative for fever and unexpected weight change (has only lost 2 lbs).  HENT:  Positive for congestion, postnasal drip, sinus pain and sore throat.   Respiratory:  Positive for cough. Negative for shortness of breath and wheezing.   Cardiovascular:  Negative for chest pain and leg swelling.  Gastrointestinal:  Positive for diarrhea. Negative for abdominal distention, blood in stool and vomiting.  Genitourinary:  Negative for dysuria.  Neurological:  Positive for headaches.  Psychiatric/Behavioral:  Positive for sleep disturbance. Negative for dysphoric mood. The patient is not nervous/anxious.     Patient Active Problem List   Diagnosis Date Noted   Osteoporosis 06/20/2020   Chronic kidney disease, stage 3a ([place]) 01/12/2020   Migraine without aura and without status migrainosus, not intractable 02/03/2019   Inguinal hernia bilateral, non-recurrent 05/25/2018   Hiatal hernia 05/21/2018   Aortic atherosclerosis ([place]) 05/21/2018   Gastroesophageal reflux disease without esophagitis 12/31/2017   Degenerative lumbar spinal stenosis 01/01/2016   Primary osteoarthritis of both hips 01/01/2016   Insomnia 07/17/2012   Chest pain 06/23/2012   Bronchiectasis ([place]) 03/02/2012   Anemia 11/13/2011   Vitamin D deficiency 10/11/2008   B12 deficiency 10/04/2008   IBS 10/01/2007   Major depression, single episode, in complete remission ([place]) 07/31/2007  Essential hypertension 07/31/2007   Chronic back pain 07/31/2007   History of colonic polyps 07/31/2007    No Known Allergies  Past Surgical History:  Procedure Laterality Date   ABDOMINAL HYSTERECTOMY  1962   ovarian cancer   APPENDECTOMY     breast biopsy- benign     BREAST CYST EXCISION Left    bunion/morton's neuroma  4/07   right foot    carpal tunnel release-right     CHOLECYSTECTOMY  1994   colitis transfusion     hosp at 25    COLONOSCOPY     very difficul- supp with BE past   EGD tight ues sphincter  7/09   with dialtion   FOOT SURGERY  10/09   TMT fusion and bunionectomy   [place]     after ccy   left knee surgery     left shoulder replacement     stress cardiolite  9/10   VIDEO BRONCHOSCOPY  04/22/2012   Procedure: VIDEO BRONCHOSCOPY WITH FLUORO;  Surgeon: Juanito Doom, MD;  Location: [facility];  Service: Cardiopulmonary;  Laterality: Bilateral;    Social History   Tobacco Use   Smoking status: Never   Smokeless tobacco: Never   Tobacco comments:    smoking cessation materials not required  Vaping Use   Vaping Use: Never used  Substance Use Topics   Alcohol use: Yes    Comment: social wine    Drug use: No     Medication list has been reviewed and updated.  Current Meds  Medication Sig   acetaminophen (TYLENOL) 500 MG tablet Take 500 mg by mouth every 6 (six) hours as needed.   ALPRAZolam (XANAX) 0.25 MG tablet TAKE 1 TABLET BY MOUTH AT BEDTIME AS NEEDED FOR ANXIETY   amitriptyline (ELAVIL) 50 MG tablet Take 1 tablet (50 mg total) by mouth at bedtime.   amoxicillin-clavulanate (AUGMENTIN) 875-125 MG tablet Take 1 tablet by mouth 2 (two) times daily for 10 days.   bisoprolol (ZEBETA) 5 MG tablet Take 1 tablet (5 mg total) by mouth in the morning and at bedtime.   buPROPion (WELLBUTRIN SR) 150 MG 12 hr tablet Take 2 tablets (300 mg total) by mouth daily.   Cholecalciferol (VITAMIN D3) 2000 units TABS Take by mouth.   gabapentin (NEURONTIN) 100 MG capsule Take 2 capsules (200 mg total) by mouth at bedtime.   pantoprazole (PROTONIX) 40 MG tablet Take 1 tablet (40 mg total) by mouth 2 (two) times daily.   triamterene-hydrochlorothiazide (MAXZIDE-25) 37.5-25 MG tablet Take 1 tablet by mouth daily.   vitamin B-12 (CYANOCOBALAMIN) 1000 MCG tablet Take 1,000 mcg by mouth daily.       05/27/2022    1:40 PM 02/13/2022   10:45 AM 08/13/2021   10:17  AM 02/12/2021    1:36 PM  GAD 7 : Generalized Anxiety Score  Nervous, Anxious, on Edge 0 0 0 0  Control/stop worrying 0 0 0 0  Worry too much - different things 0 0 0 0  Trouble relaxing 0 0 0 0  Restless 0 0 0 0  Easily annoyed or irritable 0 0 0 0  Afraid - awful might happen 0 0 0 0  Total GAD 7 Score 0 0 0 0  Anxiety Difficulty Not difficult at all Not difficult at all Not difficult at all Not difficult at all       05/27/2022    1:40 PM 02/13/2022   10:45  AM 08/13/2021   10:17 AM  Depression screen PHQ 2/9  Decreased Interest 0 0 0  Down, Depressed, Hopeless 0 0 0  PHQ - 2 Score 0 0 0  Altered sleeping 0 0 1  Tired, decreased energy 0 0 0  Change in appetite 0 0 0  Feeling bad or failure about yourself  0 0 0  Trouble concentrating 0 0 0  Moving slowly or fidgety/restless 0 0 0  Suicidal thoughts 0 0 0  PHQ-9 Score 0 0 1  Difficult doing work/chores Not difficult at all Not difficult at all Not difficult at all    BP Readings from Last 3 Encounters:  05/27/22 134/72  02/28/22 128/68  02/13/22 (!) 180/82    Physical Exam Vitals and nursing note reviewed.  Constitutional:      General: She is not in acute distress.    Appearance: Normal appearance. She is well-developed.  HENT:     Head: Normocephalic and atraumatic.     Nose:     Right Sinus: Maxillary sinus tenderness and frontal sinus tenderness present.     Left Sinus: Maxillary sinus tenderness and frontal sinus tenderness present.  Cardiovascular:     Rate and Rhythm: Normal rate and regular rhythm.     Pulses: Normal pulses.  Pulmonary:     Effort: Pulmonary effort is normal. No respiratory distress.     Breath sounds: No wheezing or rhonchi.  Abdominal:     General: Abdomen is flat.     Palpations: Abdomen is soft.     Tenderness: There is generalized abdominal tenderness. There is no right CVA tenderness, left CVA tenderness, guarding or rebound.  Musculoskeletal:     Cervical back: Normal range  of motion.  Lymphadenopathy:     Cervical: No cervical adenopathy.  Skin:    General: Skin is warm and dry.     Findings: No rash.  Neurological:     Mental Status: She is alert and oriented to person, place, and time.  Psychiatric:        Mood and Affect: Mood normal.        Behavior: Behavior normal.     Wt Readings from Last 3 Encounters:  05/27/22 134 lb 6.4 oz (61 kg)  02/28/22 136 lb (61.7 kg)  02/13/22 138 lb (62.6 kg)    BP 134/72 (BP Location: Right Arm, Cuff Size: Normal)   Pulse 78   Temp 97.8 F (36.6 C) (Oral)   Ht '5\' 2"'$  (1.575 m)   Wt 134 lb 6.4 oz (61 kg)   SpO2 98%   BMI 24.58 kg/m   Assessment and Plan: Problem List Items Addressed This Visit   None Visit Diagnoses     Diarrhea of presumed infectious origin    -  Primary   doubt diverticulitis but should be covered by Augmentin obtain stool studies push fluids   Relevant Orders   CBC with Differential/Platelet   Comprehensive metabolic panel   GI Profile, Stool, PCR   Acute non-recurrent maxillary sinusitis       Augmentin continue otc Tussin cough syrup   Relevant Medications   amoxicillin-clavulanate (AUGMENTIN) 875-125 MG tablet   Chronic cough       dx of bronchiectasis   Relevant Orders   DG Chest 2 View        Partially dictated using Dragon software. Any errors are unintentional.  Halina Maidens, MD [facility]  05/27/2022

## 2022-05-29 DIAGNOSIS — R197 Diarrhea, unspecified: Secondary | ICD-10-CM | POA: Diagnosis not present

## 2022-06-01 LAB — GI PROFILE, STOOL, PCR

## 2022-06-01 LAB — COMPREHENSIVE METABOLIC PANEL
ALT: 11 IU/L (ref 0–32)
AST: 20 IU/L (ref 0–40)
Albumin/Globulin Ratio: 2.2 (ref 1.2–2.2)
Albumin: 4.1 g/dL (ref 3.7–4.7)
Alkaline Phosphatase: 67 IU/L (ref 44–121)
BUN/Creatinine Ratio: 15 (ref 12–28)
BUN: 19 mg/dL (ref 8–27)
Bilirubin Total: 0.3 mg/dL (ref 0.0–1.2)
CO2: 22 mmol/L (ref 20–29)
Calcium: 9.2 mg/dL (ref 8.7–10.3)
Chloride: 105 mmol/L (ref 96–106)
Creatinine, Ser: 1.31 mg/dL — ABNORMAL HIGH (ref 0.57–1.00)
Globulin, Total: 1.9 g/dL (ref 1.5–4.5)
Glucose: 88 mg/dL (ref 70–99)
Potassium: 4.2 mmol/L (ref 3.5–5.2)
Sodium: 141 mmol/L (ref 134–144)
Total Protein: 6 g/dL (ref 6.0–8.5)
eGFR: 40 mL/min/{1.73_m2} — ABNORMAL LOW (ref >59)

## 2022-06-01 LAB — CBC WITH DIFFERENTIAL/PLATELET
Basophils Absolute: 0 10*3/uL (ref 0.0–0.2)
Basos: 0 %
EOS (ABSOLUTE): 0.3 10*3/uL (ref 0.0–0.4)
Eos: 3 %
Hematocrit: 31.1 % — ABNORMAL LOW (ref 34.0–46.6)
Hemoglobin: 10.6 g/dL — ABNORMAL LOW (ref 11.1–15.9)
Immature Grans (Abs): 0 10*3/uL (ref 0.0–0.1)
Immature Granulocytes: 0 %
Lymphocytes Absolute: 1.6 10*3/uL (ref 0.7–3.1)
Lymphs: 20 %
MCH: 32.9 pg (ref 26.6–33.0)
MCHC: 34.1 g/dL (ref 31.5–35.7)
MCV: 97 fL (ref 79–97)
Monocytes Absolute: 0.6 10*3/uL (ref 0.1–0.9)
Monocytes: 7 %
Neutrophils Absolute: 5.4 10*3/uL (ref 1.4–7.0)
Neutrophils: 70 %
Platelets: 293 10*3/uL (ref 150–450)
RBC: 3.22 x10E6/uL — ABNORMAL LOW (ref 3.77–5.28)
RDW: 12.8 % (ref 11.7–15.4)
WBC: 7.9 10*3/uL (ref 3.4–10.8)

## 2022-06-26 ENCOUNTER — Other Ambulatory Visit: Payer: Self-pay | Admitting: Internal Medicine

## 2022-06-26 DIAGNOSIS — F325 Major depressive disorder, single episode, in full remission: Secondary | ICD-10-CM

## 2022-06-26 NOTE — Telephone Encounter (Signed)
Medication Refill - Medication:  ALPRAZolam (XANAX) 0.25 MG tablet buPROPion (WELLBUTRIN SR) 150 MG 12 hr tablet  Has the patient contacted their pharmacy? No. Patient has not spoken with pharmacy as this will be the first time she has had the medication filled there  Preferred Pharmacy (with phone number or street name):  [id number] [place], [place] - [address] Phone: [phone]  Fax: [phone]     Has the patient been seen for an appointment in the last year OR does the patient have an upcoming appointment? No.

## 2022-06-27 MED ORDER — BUPROPION HCL ER (SR) 150 MG PO TB12: 300.0000 mg | ORAL_TABLET | Freq: Every day | ORAL | 0 refills | Status: DC

## 2022-06-27 NOTE — Telephone Encounter (Signed)
Requested Prescriptions  Pending Prescriptions Disp Refills   ALPRAZolam (XANAX) 0.25 MG tablet 30 tablet 5     Not Delegated - Psychiatry: Anxiolytics/Hypnotics 2 Failed - 06/26/2022  3:58 PM      Failed - This refill cannot be delegated      Failed - Urine Drug Screen completed in last 360 days      Passed - Patient is not pregnant      Passed - Valid encounter within last 6 months    Recent Outpatient Visits           1 month ago Diarrhea of presumed infectious origin   [place] at [facility], Jesse Sans, MD   3 months ago Essential hypertension   [place] at [facility], Jesse Sans, MD   4 months ago Essential hypertension   [place] at [facility], Jesse Sans, MD   10 months ago Encounter for Medicare annual wellness exam   [place] at [facility], Jesse Sans, MD   10 months ago Annual physical exam   [place] at [facility], Jesse Sans, MD       Future Appointments             In 1 month Glean Hess, MD [place] at [place]             buPROPion [facility] SR) 150 MG 12 hr tablet 180 tablet 0    Sig: Take 2 tablets (300 mg total) by mouth daily.     Psychiatry: Antidepressants - bupropion Failed - 06/26/2022  3:58 PM      Failed - Cr in normal range and within 360 days    Creatinine  Date Value Ref Range Status  12/10/2011 0.95 0.60 - 1.30 mg/dL Final   Creatinine, Ser  Date Value Ref Range Status  05/29/2022 1.31 (H) 0.57 - 1.00 mg/dL Final         Passed - AST in normal range and within 360 days    AST  Date Value Ref Range Status  05/29/2022 20 0 - 40 IU/L Final         Passed - ALT in normal range and within 360 days    ALT  Date Value Ref Range Status  05/29/2022 11 0 -  32 IU/L Final         Passed - Completed PHQ-2 or PHQ-9 in the last 360 days      Passed - Last BP in normal range    BP Readings from Last 1 Encounters:  05/27/22 134/72         Passed - Valid encounter within last 6 months    Recent Outpatient Visits           1 month ago Diarrhea of presumed infectious origin   [place] at [facility], Jesse Sans, MD   3 months ago Essential hypertension   [place] at [facility], Jesse Sans, MD   4 months ago Essential hypertension   [place] at [facility], Jesse Sans, MD   10 months ago Encounter for Medicare annual wellness exam   [place] at  [facility]   10 months ago Annual physical exam   [facility] at [facility], Jesse Sans, MD       Future Appointments             In 1 month Army Melia, Jesse Sans, MD [place] at [facility], [facility]

## 2022-06-27 NOTE — Telephone Encounter (Signed)
Requested medication (s) are due for refill today: yes  Requested medication (s) are on the active medication list: yes  Last refill:  03/16/22  Future visit scheduled: yes  Notes to clinic:  Unable to refill per protocol, cannot delegate.      Requested Prescriptions  Pending Prescriptions Disp Refills   ALPRAZolam (XANAX) 0.25 MG tablet 30 tablet 5     Not Delegated - Psychiatry: Anxiolytics/Hypnotics 2 Failed - 06/26/2022  3:58 PM      Failed - This refill cannot be delegated      Failed - Urine Drug Screen completed in last 360 days      Passed - Patient is not pregnant      Passed - Valid encounter within last 6 months    Recent Outpatient Visits           1 month ago Diarrhea of presumed infectious origin   [place] at [facility], Jesse Sans, MD   3 months ago Essential hypertension   [place] at [facility], Jesse Sans, MD   4 months ago Essential hypertension   [place] at [facility], Jesse Sans, MD   10 months ago Encounter for Medicare annual wellness exam   [place] at [facility], Jesse Sans, MD   10 months ago Annual physical exam   [place] at [facility], Jesse Sans, MD       Future Appointments             In 1 month Glean Hess, MD [place] at [facility], [facility]            Signed Prescriptions Disp Refills   buPROPion [facility] SR) 150 MG 12 hr tablet 180 tablet 0    Sig: Take 2 tablets (300 mg total) by mouth daily.     Psychiatry: Antidepressants - bupropion Failed - 06/26/2022  3:58 PM      Failed - Cr in normal range and within 360 days    Creatinine  Date Value Ref Range Status  12/10/2011 0.95 0.60 - 1.30 mg/dL Final   Creatinine, Ser  Date Value Ref Range Status   05/29/2022 1.31 (H) 0.57 - 1.00 mg/dL Final         Passed - AST in normal range and within 360 days    AST  Date Value Ref Range Status  05/29/2022 20 0 - 40 IU/L Final         Passed - ALT in normal range and within 360 days    ALT  Date Value Ref Range Status  05/29/2022 11 0 - 32 IU/L Final         Passed - Completed PHQ-2 or PHQ-9 in the last 360 days      Passed - Last BP in normal range    BP Readings from Last 1 Encounters:  05/27/22 134/72         Passed - Valid encounter within last 6 months    Recent Outpatient Visits           1 month ago Diarrhea of presumed infectious origin   [place] at [facility], Jesse Sans, MD   3 months ago Essential hypertension   [place]  [facility] at [facility], Jesse Sans, MD   4 months ago Essential hypertension   [place] at [facility], Jesse Sans, MD   10 months ago Encounter for Medicare annual wellness exam   [place] at [facility], Jesse Sans, MD   10 months ago Annual physical exam   [place] at [facility], Jesse Sans, MD       Future Appointments             In 1 month Army Melia, Jesse Sans, MD [place] at [facility], [facility]

## 2022-06-30 MED ORDER — ALPRAZOLAM 0.25 MG PO TABS: 0.2500 mg | ORAL_TABLET | Freq: Every evening | ORAL | 5 refills | Status: DC | PRN

## 2022-07-31 ENCOUNTER — Telehealth: Payer: Self-pay | Admitting: Internal Medicine

## 2022-07-31 NOTE — Telephone Encounter (Signed)
Copied from CRM [id number]. Topic: Medicare AWV >> Jul 31, 2022 11:12 AM Payton Doughty wrote: Reason for CRM: Called patient to schedule Medicare Annual Wellness Visit (AWV). Left message for patient to call back and schedule Medicare Annual Wellness Visit (AWV).  Last date of AWV: 08/13/21  Please schedule an appointment at any time with Kennedy Bucker, LPN  .  If any questions, please contact me.  Thank you ,  Verlee Rossetti; Care Guide Ambulatory Clinical Support [place] [facility] Direct Dial: [phone]

## 2022-08-05 ENCOUNTER — Ambulatory Visit: Payer: Self-pay | Admitting: *Deleted

## 2022-08-05 NOTE — Telephone Encounter (Signed)
Summary: bad cough and phegm   Pt called in with bad cough until ribs are sore, and phlegm . No appt until May 17      Chief Complaint: constant cough, SOB with coughing hx COPD Symptoms: constant cough, thick yellow green phlegm, rib pain pain in shoulders. Chest pain with coughing. No SOB at rest. Laying on 2 pillows at night.  Frequency: 3 weeks  Pertinent Negatives: Patient denies chest pain difficulty breathing at rest. no fever  Disposition: [] ED /[] Urgent Care (no appt availability in office) / [x] Appointment(In office/virtual)/ []  [place] [facility]/ [] Home Care/ [] Refused Recommended Disposition /[] [place] [facility]/ []  Follow-up with PCP Additional Notes:   Appt scheduled for tomorrow. Recommended if sx worsen go to ED. Attempted appt today but patient unable to make time due to talking to NT at 3:17 and appt was at 3:20.           Reason for Disposition  [1] MILD difficulty breathing (e.g., minimal/no SOB at rest, SOB with walking, pulse <100) AND [2] still present when not coughing  Answer Assessment - Initial Assessment Questions 1. ONSET: "When did the cough begin?"      3 weeks ago  2. SEVERITY: "How bad is the cough today?"      Constant cough  3. SPUTUM: "Describe the color of your sputum" (none, dry cough; clear, white, yellow, green)     Yellow green  4. HEMOPTYSIS: "Are you coughing up any blood?" If so ask: "How much?" (flecks, streaks, tablespoons, etc.)     na 5. DIFFICULTY BREATHING: "Are you having difficulty breathing?" If Yes, ask: "How bad is it?" (e.g., mild, moderate, severe)    - MILD: No SOB at rest, mild SOB with walking, speaks normally in sentences, can lie down, no retractions, pulse < 100.    - MODERATE: SOB at rest, SOB with minimal exertion and prefers to sit, cannot lie down flat, speaks in phrases, mild retractions, audible wheezing, pulse 100-120.    - SEVERE: Very SOB at rest, speaks in single words, struggling to breathe,  sitting hunched forward, retractions, pulse > 120      Shortness of breath with coughing  6. FEVER: "Do you have a fever?" If Yes, ask: "What is your temperature, how was it measured, and when did it start?"     Sweating couple of times  7. CARDIAC HISTORY: "Do you have any history of heart disease?" (e.g., heart attack, congestive heart failure)      Hx see hx  8. LUNG HISTORY: "Do you have any history of lung disease?"  (e.g., pulmonary embolus, asthma, emphysema)     Hx COPD, 9. PE RISK FACTORS: "Do you have a history of blood clots?" (or: recent major surgery, recent prolonged travel, bedridden)     na 10. OTHER SYMPTOMS: "Do you have any other symptoms?" (e.g., runny nose, wheezing, chest pain)       Chest pain shortness of breath with exertion and coughing, ribs sore constant cough.  11. PREGNANCY: "Is there any chance you are pregnant?" "When was your last menstrual period?"       na 12. TRAVEL: "Have you traveled out of the country in the last month?" (e.g., travel history, exposures)       na  Protocols used: Cough - Acute Productive-A-AH

## 2022-08-06 ENCOUNTER — Encounter: Payer: Self-pay | Admitting: Internal Medicine

## 2022-08-06 ENCOUNTER — Ambulatory Visit
Admission: RE | Admit: 2022-08-06 | Discharge: 2022-08-06 | Disposition: A | Discharge status: Home / Self Care | Payer: HMO | Source: Ambulatory Visit | Attending: Internal Medicine | Admitting: Internal Medicine

## 2022-08-06 ENCOUNTER — Ambulatory Visit
Admission: RE | Admit: 2022-08-06 | Discharge: 2022-08-06 | Disposition: A | Discharge status: Home / Self Care | Payer: HMO | Attending: Internal Medicine | Admitting: Internal Medicine

## 2022-08-06 ENCOUNTER — Ambulatory Visit (INDEPENDENT_AMBULATORY_CARE_PROVIDER_SITE_OTHER): Payer: HMO | Admitting: Internal Medicine

## 2022-08-06 ENCOUNTER — Other Ambulatory Visit: Payer: Self-pay | Admitting: Internal Medicine

## 2022-08-06 VITALS — BP 94/50 | HR 102 | Temp 97.6°F | Ht 62.0 in | Wt 131.0 lb

## 2022-08-06 DIAGNOSIS — J189 Pneumonia, unspecified organism: Secondary | ICD-10-CM | POA: Diagnosis not present

## 2022-08-06 DIAGNOSIS — I10 Essential (primary) hypertension: Secondary | ICD-10-CM | POA: Diagnosis not present

## 2022-08-06 DIAGNOSIS — J181 Lobar pneumonia, unspecified organism: Secondary | ICD-10-CM | POA: Diagnosis not present

## 2022-08-06 DIAGNOSIS — J449 Chronic obstructive pulmonary disease, unspecified: Secondary | ICD-10-CM | POA: Diagnosis not present

## 2022-08-06 DIAGNOSIS — J439 Emphysema, unspecified: Secondary | ICD-10-CM | POA: Diagnosis not present

## 2022-08-06 DIAGNOSIS — J479 Bronchiectasis, uncomplicated: Secondary | ICD-10-CM

## 2022-08-06 MED ORDER — LEVOFLOXACIN 500 MG PO TABS: 500.0000 mg | ORAL_TABLET | Freq: Every day | ORAL | 0 refills | Status: DC

## 2022-08-06 MED ORDER — ALBUTEROL SULFATE HFA 108 (90 BASE) MCG/ACT IN AERS
2.0000 | INHALATION_SPRAY | Freq: Four times a day (QID) | RESPIRATORY_TRACT | 0 refills | Status: DC | PRN
Start: 2022-08-06 — End: 2022-11-05

## 2022-08-06 MED ORDER — BISOPROLOL FUMARATE 5 MG PO TABS: 5.0000 mg | ORAL_TABLET | Freq: Two times a day (BID) | ORAL | 1 refills | Status: DC

## 2022-08-06 MED ORDER — HYDROCODONE BIT-HOMATROP MBR 5-1.5 MG/5ML PO SOLN: 5.0000 mL | Freq: Four times a day (QID) | ORAL | 0 refills | Status: DC | PRN

## 2022-08-06 NOTE — Progress Notes (Signed)
Date:  08/06/2022   Name:  Jodi Bartlett   DOB:  Mar 26, 1936   MRN:  [id number]   Chief Complaint: Cough  Cough This is a recurrent problem. Episode onset: 3 weeks. The problem has been gradually worsening. The problem occurs constantly. The cough is Productive of sputum. Associated symptoms include nasal congestion, rhinorrhea, shortness of breath and wheezing. Pertinent negatives include no chest pain, chills, fever, headaches, postnasal drip or sore throat.    Lab Results  Component Value Date   NA 141 05/29/2022   K 4.2 05/29/2022   CO2 22 05/29/2022   GLUCOSE 88 05/29/2022   BUN 19 05/29/2022   CREATININE 1.31 (H) 05/29/2022   CALCIUM 9.2 05/29/2022   EGFR 40 (L) 05/29/2022   GFRNONAA 44 (L) 01/11/2020   Lab Results  Component Value Date   CHOL 180 08/13/2021   HDL 44 08/13/2021   LDLCALC 116 (H) 08/13/2021   TRIG 112 08/13/2021   CHOLHDL 4.1 08/13/2021   Lab Results  Component Value Date   TSH 4.270 08/13/2021   No results found for: "HGBA1C" Lab Results  Component Value Date   WBC 7.9 05/29/2022   HGB 10.6 (L) 05/29/2022   HCT 31.1 (L) 05/29/2022   MCV 97 05/29/2022   PLT 293 05/29/2022   Lab Results  Component Value Date   ALT 11 05/29/2022   AST 20 05/29/2022   ALKPHOS 67 05/29/2022   BILITOT 0.3 05/29/2022   Lab Results  Component Value Date   VD25OH 49.5 08/13/2021     Review of Systems  Constitutional:  Positive for diaphoresis and fatigue. Negative for chills and fever.  HENT:  Positive for rhinorrhea. Negative for postnasal drip and sore throat.   Respiratory:  Positive for cough, chest tightness, shortness of breath and wheezing.   Cardiovascular:  Negative for chest pain and palpitations.  Gastrointestinal:  Negative for abdominal pain, constipation, diarrhea, nausea and vomiting.  Neurological:  Positive for light-headedness. Negative for dizziness and headaches.  Psychiatric/Behavioral:  Positive for sleep disturbance. Negative for  dysphoric mood. The patient is not nervous/anxious.     Patient Active Problem List   Diagnosis Date Noted   Osteoporosis 06/20/2020   Chronic kidney disease, stage 3a (HCC) 01/12/2020   Migraine without aura and without status migrainosus, not intractable 02/03/2019   Inguinal hernia bilateral, non-recurrent 05/25/2018   Hiatal hernia 05/21/2018   Aortic atherosclerosis (HCC) 05/21/2018   Gastroesophageal reflux disease without esophagitis 12/31/2017   Degenerative lumbar spinal stenosis 01/01/2016   Primary osteoarthritis of both hips 01/01/2016   Insomnia 07/17/2012   Chest pain 06/23/2012   Bronchiectasis (HCC) 03/02/2012   Anemia 11/13/2011   Vitamin D deficiency 10/11/2008   B12 deficiency 10/04/2008   IBS 10/01/2007   Major depression, single episode, in complete remission (HCC) 07/31/2007   Essential hypertension 07/31/2007   Chronic back pain 07/31/2007   History of colonic polyps 07/31/2007    No Known Allergies  Past Surgical History:  Procedure Laterality Date   ABDOMINAL HYSTERECTOMY  1962   ovarian cancer   APPENDECTOMY     breast biopsy- benign     BREAST CYST EXCISION Left    bunion/morton's neuroma  4/07   right foot    carpal tunnel release-right     CHOLECYSTECTOMY  1994   colitis transfusion     hosp at 25   COLONOSCOPY     very difficul- supp with BE past   EGD tight ues sphincter  7/09  with dialtion   FOOT SURGERY  10/09   TMT fusion and bunionectomy   HEMORRHOID SURGERY     INCISIONAL HERNIA REPAIR     after ccy   left knee surgery     left shoulder replacement     stress cardiolite  9/10   VIDEO BRONCHOSCOPY  04/22/2012   Procedure: VIDEO BRONCHOSCOPY WITH FLUORO;  Surgeon: Lupita Leash, MD;  Location: [facility];  Service: Cardiopulmonary;  Laterality: Bilateral;    Social History   Tobacco Use   Smoking status: Never   Smokeless tobacco: Never   Tobacco comments:    smoking cessation materials not required  Vaping Use    Vaping Use: Never used  Substance Use Topics   Alcohol use: Yes    Comment: social wine    Drug use: No     Medication list has been reviewed and updated.  Current Meds  Medication Sig   acetaminophen (TYLENOL) 500 MG tablet Take 500 mg by mouth every 6 (six) hours as needed.   albuterol (VENTOLIN HFA) 108 (90 Base) MCG/ACT inhaler Inhale 2 puffs into the lungs every 6 (six) hours as needed for wheezing or shortness of breath.   ALPRAZolam (XANAX) 0.25 MG tablet Take 1 tablet (0.25 mg total) by mouth at bedtime as needed for anxiety.   amitriptyline (ELAVIL) 50 MG tablet Take 1 tablet (50 mg total) by mouth at bedtime.   buPROPion (WELLBUTRIN SR) 150 MG 12 hr tablet Take 2 tablets (300 mg total) by mouth daily.   Cholecalciferol (VITAMIN D3) 2000 units TABS Take by mouth.   gabapentin (NEURONTIN) 100 MG capsule Take 2 capsules (200 mg total) by mouth at bedtime.   HYDROcodone bit-homatropine (HYCODAN) 5-1.5 MG/5ML syrup Take 5 mLs by mouth every 6 (six) hours as needed for up to 10 days for cough.   levofloxacin (LEVAQUIN) 500 MG tablet Take 1 tablet (500 mg total) by mouth daily for 7 days.   pantoprazole (PROTONIX) 40 MG tablet Take 1 tablet (40 mg total) by mouth 2 (two) times daily.   triamterene-hydrochlorothiazide (MAXZIDE-25) 37.5-25 MG tablet Take 1 tablet by mouth daily.   vitamin B-12 (CYANOCOBALAMIN) 1000 MCG tablet Take 1,000 mcg by mouth daily.   [DISCONTINUED] bisoprolol (ZEBETA) 5 MG tablet Take 1 tablet (5 mg total) by mouth in the morning and at bedtime.       08/06/2022    9:04 AM 05/27/2022    1:40 PM 02/13/2022   10:45 AM 08/13/2021   10:17 AM  GAD 7 : Generalized Anxiety Score  Nervous, Anxious, on Edge 0 0 0 0  Control/stop worrying 0 0 0 0  Worry too much - different things 0 0 0 0  Trouble relaxing 0 0 0 0  Restless 0 0 0 0  Easily annoyed or irritable 0 0 0 0  Afraid - awful might happen 0 0 0 0  Total GAD 7 Score 0 0 0 0  Anxiety Difficulty Not  difficult at all Not difficult at all Not difficult at all Not difficult at all       08/06/2022    9:03 AM 05/27/2022    1:40 PM 02/13/2022   10:45 AM  Depression screen PHQ 2/9  Decreased Interest 0 0 0  Down, Depressed, Hopeless 0 0 0  PHQ - 2 Score 0 0 0  Altered sleeping 2 0 0  Tired, decreased energy 2 0 0  Change in appetite 0 0 0  Feeling bad or failure  about yourself  0 0 0  Trouble concentrating 0 0 0  Moving slowly or fidgety/restless 0 0 0  Suicidal thoughts 0 0 0  PHQ-9 Score 4 0 0  Difficult doing work/chores Not difficult at all Not difficult at all Not difficult at all    BP Readings from Last 3 Encounters:  08/06/22 (!) 94/50  05/27/22 134/72  02/28/22 128/68    Physical Exam Constitutional:      Appearance: She is ill-appearing.  Cardiovascular:     Rate and Rhythm: Normal rate and regular rhythm.     Heart sounds: No murmur heard. Pulmonary:     Effort: Pulmonary effort is normal.     Breath sounds: Examination of the left-middle field reveals rhonchi. Examination of the left-lower field reveals rhonchi. Rhonchi present.  Musculoskeletal:     Cervical back: Normal range of motion.  Lymphadenopathy:     Cervical: No cervical adenopathy.  Neurological:     Mental Status: She is alert.     Wt Readings from Last 3 Encounters:  08/06/22 131 lb (59.4 kg)  05/27/22 134 lb 6.4 oz (61 kg)  02/28/22 136 lb (61.7 kg)    BP (!) 94/50   Pulse (!) 102   Temp 97.6 F (36.4 C) (Oral)   Ht 5\' 2"  (1.575 m)   Wt 131 lb (59.4 kg)   SpO2 93%   BMI 23.96 kg/m   Assessment and Plan:  Problem List Items Addressed This Visit       Cardiovascular and Mediastinum   Essential hypertension (Chronic)   Relevant Medications   bisoprolol (ZEBETA) 5 MG tablet     Respiratory   Bronchiectasis (HCC) (Chronic)   Relevant Medications   albuterol (VENTOLIN HFA) 108 (90 Base) MCG/ACT inhaler   Other Visit Diagnoses     Community acquired pneumonia of left  lower lobe of lung    -  Primary   hx of bronchiectasis with recurrent PNA will get labs and imaging treat empirically with antibiotics/albuterol/cough suppressants   Relevant Medications   levofloxacin (LEVAQUIN) 500 MG tablet   HYDROcodone bit-homatropine (HYCODAN) 5-1.5 MG/5ML syrup   albuterol (VENTOLIN HFA) 108 (90 Base) MCG/ACT inhaler   Other Relevant Orders   CBC with Differential/Platelet   Comprehensive metabolic panel   DG Chest 2 View       No follow-ups on file.   Partially dictated using Dragon software, any errors are not intentional.  Reubin Milan, MD [facility] [place], [place]

## 2022-08-07 ENCOUNTER — Other Ambulatory Visit: Payer: Self-pay

## 2022-08-07 ENCOUNTER — Encounter: Payer: Self-pay | Admitting: Emergency Medicine

## 2022-08-07 ENCOUNTER — Emergency Department
Admission: EM | Admit: 2022-08-07 | Discharge: 2022-08-07 | Disposition: A | Discharge status: Home / Self Care | Payer: HMO | Attending: Emergency Medicine | Admitting: Emergency Medicine

## 2022-08-07 DIAGNOSIS — I129 Hypertensive chronic kidney disease with stage 1 through stage 4 chronic kidney disease, or unspecified chronic kidney disease: Secondary | ICD-10-CM | POA: Insufficient documentation

## 2022-08-07 DIAGNOSIS — D72829 Elevated white blood cell count, unspecified: Secondary | ICD-10-CM | POA: Insufficient documentation

## 2022-08-07 DIAGNOSIS — J189 Pneumonia, unspecified organism: Secondary | ICD-10-CM | POA: Diagnosis not present

## 2022-08-07 DIAGNOSIS — J181 Lobar pneumonia, unspecified organism: Secondary | ICD-10-CM | POA: Insufficient documentation

## 2022-08-07 DIAGNOSIS — N189 Chronic kidney disease, unspecified: Secondary | ICD-10-CM | POA: Diagnosis not present

## 2022-08-07 DIAGNOSIS — N179 Acute kidney failure, unspecified: Secondary | ICD-10-CM

## 2022-08-07 DIAGNOSIS — R799 Abnormal finding of blood chemistry, unspecified: Secondary | ICD-10-CM | POA: Diagnosis present

## 2022-08-07 LAB — CBC WITH DIFFERENTIAL/PLATELET
Abs Immature Granulocytes: 0.19 10*3/uL — ABNORMAL HIGH (ref 0.00–0.07)
Basophils Absolute: 0 10*3/uL (ref 0.0–0.2)
Basophils Absolute: 0 10*3/uL (ref 0.0–0.1)
Basophils Relative: 0 %
Basos: 0 %
EOS (ABSOLUTE): 0 10*3/uL (ref 0.0–0.4)
Eos: 0 %
Eosinophils Absolute: 0 10*3/uL (ref 0.0–0.5)
Eosinophils Relative: 0 %
HCT: 29.4 % — ABNORMAL LOW (ref 36.0–46.0)
Hematocrit: 28.3 % — ABNORMAL LOW (ref 34.0–46.6)
Hemoglobin: 9.4 g/dL — ABNORMAL LOW (ref 11.1–15.9)
Hemoglobin: 9.4 g/dL — ABNORMAL LOW (ref 12.0–15.0)
Immature Grans (Abs): 0.2 10*3/uL — ABNORMAL HIGH (ref 0.0–0.1)
Immature Granulocytes: 1 %
Immature Granulocytes: 1 %
Lymphocytes Absolute: 1 10*3/uL (ref 0.7–3.1)
Lymphocytes Relative: 4 %
Lymphs Abs: 0.7 10*3/uL (ref 0.7–4.0)
Lymphs: 5 %
MCH: 33.1 pg — ABNORMAL HIGH (ref 26.6–33.0)
MCH: 33.1 pg (ref 26.0–34.0)
MCHC: 33.2 g/dL (ref 31.5–35.7)
MCHC: 32 g/dL (ref 30.0–36.0)
MCV: 100 fL — ABNORMAL HIGH (ref 79–97)
MCV: 103.5 fL — ABNORMAL HIGH (ref 80.0–100.0)
Monocytes Absolute: 1.6 10*3/uL — ABNORMAL HIGH (ref 0.1–0.9)
Monocytes Absolute: 1.3 10*3/uL — ABNORMAL HIGH (ref 0.1–1.0)
Monocytes Relative: 7 %
Monocytes: 8 %
Neutro Abs: 16.2 10*3/uL — ABNORMAL HIGH (ref 1.7–7.7)
Neutrophils Absolute: 17.8 10*3/uL — ABNORMAL HIGH (ref 1.4–7.0)
Neutrophils Relative %: 88 %
Neutrophils: 86 %
Platelets: 529 10*3/uL — ABNORMAL HIGH (ref 150–450)
Platelets: 541 10*3/uL — ABNORMAL HIGH (ref 150–400)
RBC: 2.84 x10E6/uL — ABNORMAL LOW (ref 3.77–5.28)
RBC: 2.84 MIL/uL — ABNORMAL LOW (ref 3.87–5.11)
RDW: 12.1 % (ref 11.7–15.4)
RDW: 12.5 % (ref 11.5–15.5)
WBC: 20.6 10*3/uL (ref 3.4–10.8)
WBC: 18.4 10*3/uL — ABNORMAL HIGH (ref 4.0–10.5)
nRBC: 0 % (ref 0.0–0.2)

## 2022-08-07 LAB — COMPREHENSIVE METABOLIC PANEL
ALT: 30 IU/L (ref 0–32)
AST: 31 IU/L (ref 0–40)
Albumin/Globulin Ratio: 1.6 (ref 1.2–2.2)
Albumin: 4.1 g/dL (ref 3.7–4.7)
Alkaline Phosphatase: 214 IU/L — ABNORMAL HIGH (ref 44–121)
BUN/Creatinine Ratio: 15 (ref 12–28)
BUN: 25 mg/dL (ref 8–27)
Bilirubin Total: 0.8 mg/dL (ref 0.0–1.2)
CO2: 18 mmol/L — ABNORMAL LOW (ref 20–29)
Calcium: 9.6 mg/dL (ref 8.7–10.3)
Chloride: 99 mmol/L (ref 96–106)
Creatinine, Ser: 1.71 mg/dL — ABNORMAL HIGH (ref 0.57–1.00)
Globulin, Total: 2.5 g/dL (ref 1.5–4.5)
Glucose: 107 mg/dL — ABNORMAL HIGH (ref 70–99)
Potassium: 4.8 mmol/L (ref 3.5–5.2)
Sodium: 139 mmol/L (ref 134–144)
Total Protein: 6.6 g/dL (ref 6.0–8.5)
eGFR: 29 mL/min/{1.73_m2} — ABNORMAL LOW (ref >59)

## 2022-08-07 LAB — BASIC METABOLIC PANEL
Anion gap: 16 — ABNORMAL HIGH (ref 5–15)
BUN: 40 mg/dL — ABNORMAL HIGH (ref 8–23)
CO2: 18 mmol/L — ABNORMAL LOW (ref 22–32)
Calcium: 9.3 mg/dL (ref 8.9–10.3)
Chloride: 100 mmol/L (ref 98–111)
Creatinine, Ser: 2.31 mg/dL — ABNORMAL HIGH (ref 0.44–1.00)
GFR, Estimated: 20 mL/min — ABNORMAL LOW (ref >60)
Glucose, Bld: 95 mg/dL (ref 70–99)
Potassium: 4.4 mmol/L (ref 3.5–5.1)
Sodium: 134 mmol/L — ABNORMAL LOW (ref 135–145)

## 2022-08-07 LAB — LACTIC ACID, PLASMA: Lactic Acid, Venous: 1.1 mmol/L (ref 0.5–1.9)

## 2022-08-07 MED ORDER — LEVOFLOXACIN IN D5W 500 MG/100ML IV SOLN
500.0000 mg | Freq: Once | INTRAVENOUS | Status: AC
  Administered 2022-08-07: 500 mg via INTRAVENOUS
  Filled 2022-08-07: qty 100

## 2022-08-07 MED ORDER — SODIUM CHLORIDE 0.9 % IV BOLUS
1000.0000 mL | Freq: Once | INTRAVENOUS | Status: AC
  Administered 2022-08-07: 1000 mL via INTRAVENOUS

## 2022-08-07 NOTE — ED Notes (Signed)
Rainbow, Lactic Acid, and 1 set of blood cultures sent to lab.

## 2022-08-07 NOTE — Discharge Instructions (Signed)
Take your antibiotics and your inhaler as prescribed by your doctor.  Drink plenty of fluids to stay well-hydrated.  Find Pedialyte or similar electrolyte rehydration formulas at your local pharmacy and drink throughout the day.  Call your doctor for a follow-up appointment within this next week to check on your symptoms of pneumonia as well as to recheck your blood tests and kidney tests.  If you develop any new, worsening, or unexpected symptoms come back to the emergency department

## 2022-08-07 NOTE — ED Notes (Signed)
Pt verbalizes understanding of discharge instructions. Opportunity for questioning and answers were provided. Pt discharged from ED to home with family.    

## 2022-08-07 NOTE — ED Provider Notes (Signed)
[facility] Provider Note    Event Date/Time   First MD Initiated Contact with Patient 08/07/22 1504     (approximate)   History   Abnormal blood work   HPI  Jodi Bartlett is a 87 y.o. female   Past medical history of CKD, gastroesophageal reflux, hypertension, who presents to the emergency department with pneumonia and abnormal blood testing from her primary doctor.  She is a very independent 87 year old who lives alone and is very active.  Over the last several weeks she has felt fatigued and has had a cough.  She denies fevers or chills.  She denies chest pain.  She was diagnosed with a left-sided pneumonia yesterday at her primary doctor's office and had blood testing sent.  These blood tests were abnormal today and she was told to come to the emergency department for an evaluation.  Notably, her white blood cell count was elevated and she her creatinine clearance was elevated from prior.  She otherwise is felt well and able to perform activities of daily living.  She has been compliant with the levofloxacin prescribed.  Independent Historian contributed to assessment above: Her daughter who is at bedside corroborates information given above  External Medical Documents Reviewed: Primary care visit dated yesterday morning diagnosed with left lower lobe pneumonia and started on Levaquin      Physical Exam   Triage Vital Signs: ED Triage Vitals  Enc Vitals Group     BP 08/07/22 1340 (!) 133/59     Pulse Rate 08/07/22 1340 83     Resp 08/07/22 1340 16     Temp --      Temp src --      SpO2 08/07/22 1340 94 %     Weight 08/07/22 1341 129 lb (58.5 kg)     Height 08/07/22 1341 5\' 2"  (1.575 m)     Head Circumference --      Peak Flow --      Pain Score 08/07/22 1341 0     Pain Loc --      Pain Edu? --      Excl. in GC? --     Most recent vital signs: Vitals:   08/07/22 1340 08/07/22 1543  BP: (!) 133/59   Pulse: 83   Resp: 16   Temp:  98.2  F (36.8 C)  SpO2: 94%     General: Awake, no distress.  CV:  Good peripheral perfusion.  Resp:  Normal effort.  Abd:  No distention.  Other:  Dry hacking cough occasionally.  Hemodynamics appropriate reassuring and afebrile.  She has some coarse breath sounds to the left side compared to the right.  She is not in respiratory distress and she looks euvolemic comfortable nontoxic.   ED Results / Procedures / Treatments   Labs (all labs ordered are listed, but only abnormal results are displayed) Labs Reviewed  CBC WITH DIFFERENTIAL/PLATELET - Abnormal; Notable for the following components:      Result Value   WBC 18.4 (*)    RBC 2.84 (*)    Hemoglobin 9.4 (*)    HCT 29.4 (*)    MCV 103.5 (*)    Platelets 541 (*)    Neutro Abs 16.2 (*)    Monocytes Absolute 1.3 (*)    Abs Immature Granulocytes 0.19 (*)    All other components within normal limits  BASIC METABOLIC PANEL - Abnormal; Notable for the following components:   Sodium 134 (*)  CO2 18 (*)    BUN 40 (*)    Creatinine, Ser 2.31 (*)    GFR, Estimated 20 (*)    Anion gap 16 (*)    All other components within normal limits  LACTIC ACID, PLASMA     I ordered and reviewed the above labs they are notable for white blood cell count is elevated at 18 and her creatinine is 2.3 compared to prior of 1.7.    RADIOLOGY I independently reviewed and interpreted x-ray from yesterday of the chest that shows left-sided focality concerning for pneumonia   PROCEDURES:  Critical Care performed: No  Procedures   MEDICATIONS ORDERED IN ED: Medications  levofloxacin (LEVAQUIN) IVPB 500 mg (500 mg Intravenous New Bag/Given 08/07/22 1541)  sodium chloride 0.9 % bolus 1,000 mL (1,000 mLs Intravenous New Bag/Given 08/07/22 1538)    IMPRESSION / MDM / ASSESSMENT AND PLAN / ED COURSE  I reviewed the triage vital signs and the nursing notes.                                Patient's presentation is most consistent with acute  presentation with potential threat to life or bodily function.  Differential diagnosis includes, but is not limited to, pneumonia, sepsis, renal failure   The patient is on the cardiac monitor to evaluate for evidence of arrhythmia and/or significant heart rate changes.  MDM: This is a patient who looks well diagnosed with pneumonia yesterday coming in for abnormal labs.  His creatinine function is slightly increased from prior and in the setting of infection and poor p.o. intake this is likely dehydration related.  I will give her IV crystalloid bolus encourage p.o. intake at home.  Her white blood cell count is expectedly high from her pneumonia.  She has been compliant with the Levaquin and only has been 1 day of antibiotics.  She is otherwise felt well and able to perform activities of daily living her hemodynamics appropriate reassuring she is not hypoxemic and no respiratory distress.  I will give her some IV crystalloid infusion via IV as well as an IV dose of her Levaquin.  I considered observation status or admission however given her well appearance and just recently started antibiotics I think she can go home today and follow-up with her PMD --curb 67 pneumonia severity score is 2 points moderate risk " consider inpatient treatment or outpatient with close follow-up" and the patient prefers to be managed as an outpatient.    She understands to return to the emergency  department if any new or worsening symptoms.        FINAL CLINICAL IMPRESSION(S) / ED DIAGNOSES   Final diagnoses:  Community acquired pneumonia of left lower lobe of lung  AKI (acute kidney injury) (HCC)     Rx / DC Orders   ED Discharge Orders     None        Note:  This document was prepared using Dragon voice recognition software and may include unintentional dictation errors.    Pilar Jarvis, MD 08/07/22 [phone]

## 2022-08-07 NOTE — ED Triage Notes (Signed)
Pt to ED via POV. Pt states that she was diagnosed with pneumonia yesterday by her PCP. Pt states that she was given oral antibiotics and she took 1 dose yesterday. Pt states that her blood work came back today and she was told to come to the ED because her WBC was increased and her Kidney function was down.

## 2022-08-08 ENCOUNTER — Ambulatory Visit: Payer: Self-pay | Admitting: *Deleted

## 2022-08-08 NOTE — Telephone Encounter (Signed)
  Chief Complaint: Recent ED visit- diagnosed with pneumonia  Symptoms: wheezing, cough, SOB with exertion, fatigue Frequency: symptoms 3 weeks  Disposition: [] ED /[] Urgent Care (no appt availability in office) / [x] Appointment(In office/virtual)/ []  [facility]/ [] Home Care/ [] Refused Recommended Disposition /[] [place] [facility]/ []  Follow-up with PCP Additional Notes: Patient is calling for follow up appointment- patient states she was advised to be seen as soon as possible. Call to office- and advised to schedule Monday for hospital f/u. Patient wants to know if she needs to have labs checked before that appointment. Advised I would check with provider regarding timing og needed labs and office will let her know

## 2022-08-08 NOTE — Telephone Encounter (Signed)
Please review.  KP

## 2022-08-08 NOTE — Telephone Encounter (Signed)
Reason for Disposition  [1] Caller requesting NON-URGENT health information AND [2] PCP's office is the best resource  Answer Assessment - Initial Assessment Questions 1. RESPIRATORY STATUS: "Describe your breathing?" (e.g., wheezing, shortness of breath, unable to speak, severe coughing)      Cough, inhaler for breathing 2. ONSET: "When did this breathing problem begin?"      Patient was seen at ED yesterday, Tuesday so weak she was sent to ED by PCP- 3 weeks 3. PATTERN "Does the difficult breathing come and go, or has it been constant since it started?"      Comes and goes 4. SEVERITY: "How bad is your breathing?" (e.g., mild, moderate, severe)    - MILD: No SOB at rest, mild SOB with walking, speaks normally in sentences, can lie down, no retractions, pulse < 100.    - MODERATE: SOB at rest, SOB with minimal exertion and prefers to sit, cannot lie down flat, speaks in phrases, mild retractions, audible wheezing, pulse 100-120.    - SEVERE: Very SOB at rest, speaks in single words, struggling to breathe, sitting hunched forward, retractions, pulse > 120      mild  Answer Assessment - Initial Assessment Questions 1. REASON FOR CALL or QUESTION: "What is your reason for calling today?" or "How can I best help you?" or "What question do you have that I can help answer?"     Patient has recent ED visit and was told to follow up as soon as possible. She is concerned about getting repeat labs as instructed. She wants to know if she needs labs drawn before her appointment Monday. Please let her know.  Protocols used: Breathing Difficulty-A-AH, Information Only Call - No Triage-A-AH

## 2022-08-09 NOTE — Telephone Encounter (Signed)
Called pt she is aware she can get labs done on Monday. And verbalized understanding.  KP

## 2022-08-12 ENCOUNTER — Encounter: Payer: Self-pay | Admitting: Internal Medicine

## 2022-08-12 ENCOUNTER — Ambulatory Visit (INDEPENDENT_AMBULATORY_CARE_PROVIDER_SITE_OTHER): Payer: HMO | Admitting: Internal Medicine

## 2022-08-12 VITALS — BP 110/78 | HR 96 | Temp 97.7°F | Ht 62.0 in | Wt 127.0 lb

## 2022-08-12 DIAGNOSIS — J189 Pneumonia, unspecified organism: Secondary | ICD-10-CM

## 2022-08-12 MED ORDER — HYDROCODONE BIT-HOMATROP MBR 5-1.5 MG/5ML PO SOLN
5.0000 mL | Freq: Four times a day (QID) | ORAL | 0 refills | Status: DC | PRN
Start: 2022-08-12 — End: 2023-09-09

## 2022-08-12 MED ORDER — LEVOFLOXACIN 500 MG PO TABS
500.0000 mg | ORAL_TABLET | Freq: Every day | ORAL | 0 refills | Status: AC
Start: 2022-08-12 — End: 2022-08-19

## 2022-08-12 NOTE — Patient Instructions (Addendum)
Continue Mucinex twice a day.  -It was a pleasure to see you today! Please review your visit summary for helpful information. -Lab results are usually available within 1-2 days and we will call once reviewed. -I would encourage you to follow your care via MyChart where you can access lab results, notes, messages, and more. -If you feel that we did a nice job today, please complete your after-visit survey and leave [place] a Google review! Your CMA today was Emmilynn Marut and your provider was Dr Bari Edward, MD.

## 2022-08-12 NOTE — Progress Notes (Signed)
Date:  08/12/2022   Name:  BRYANNE GANGI   DOB:  Jun 22, 1935   MRN:  [id number]   Chief Complaint: Hospitalization Follow-up (Exhausted, still not feeling well. Cough- green production. ) ER follow up.  Seen last week in clinic with [facility].  She was not doing well so went to ED the next day.  Labs were stable and she was given IVF and one dose of IV Levofloxin.  Curb65 = 2 and it was felt she could continue to be treated as an outpatient. She still feels very weak with any activity.  She is coughing but able to sleep fairly well.  No diarrhea or vomiting. She has a daughter and a sister who live very close.  HPI  Lab Results  Component Value Date   NA 134 (L) 08/07/2022   K 4.4 08/07/2022   CO2 18 (L) 08/07/2022   GLUCOSE 95 08/07/2022   BUN 40 (H) 08/07/2022   CREATININE 2.31 (H) 08/07/2022   CALCIUM 9.3 08/07/2022   EGFR 29 (L) 08/06/2022   GFRNONAA 20 (L) 08/07/2022   Lab Results  Component Value Date   CHOL 180 08/13/2021   HDL 44 08/13/2021   LDLCALC 116 (H) 08/13/2021   TRIG 112 08/13/2021   CHOLHDL 4.1 08/13/2021   Lab Results  Component Value Date   TSH 4.270 08/13/2021   No results found for: "HGBA1C" Lab Results  Component Value Date   WBC 18.4 (H) 08/07/2022   HGB 9.4 (L) 08/07/2022   HCT 29.4 (L) 08/07/2022   MCV 103.5 (H) 08/07/2022   PLT 541 (H) 08/07/2022   Lab Results  Component Value Date   ALT 30 08/06/2022   AST 31 08/06/2022   ALKPHOS 214 (H) 08/06/2022   BILITOT 0.8 08/06/2022   Lab Results  Component Value Date   VD25OH 49.5 08/13/2021     Review of Systems  Constitutional:  Positive for diaphoresis and fatigue. Negative for chills and fever.  Respiratory:  Positive for cough, chest tightness and shortness of breath. Negative for wheezing.   Cardiovascular:  Negative for chest pain and palpitations.  Neurological:  Negative for dizziness, light-headedness and headaches.  Psychiatric/Behavioral:  Negative for dysphoric mood. The  patient is not nervous/anxious.     Patient Active Problem List   Diagnosis Date Noted   Osteoporosis 06/20/2020   Chronic kidney disease, stage 3a (HCC) 01/12/2020   Migraine without aura and without status migrainosus, not intractable 02/03/2019   Inguinal hernia bilateral, non-recurrent 05/25/2018   Hiatal hernia 05/21/2018   Aortic atherosclerosis (HCC) 05/21/2018   Gastroesophageal reflux disease without esophagitis 12/31/2017   Degenerative lumbar spinal stenosis 01/01/2016   Primary osteoarthritis of both hips 01/01/2016   Insomnia 07/17/2012   Chest pain 06/23/2012   Bronchiectasis (HCC) 03/02/2012   Anemia 11/13/2011   Vitamin D deficiency 10/11/2008   B12 deficiency 10/04/2008   IBS 10/01/2007   Major depression, single episode, in complete remission (HCC) 07/31/2007   Essential hypertension 07/31/2007   Chronic back pain 07/31/2007   History of colonic polyps 07/31/2007    No Known Allergies  Past Surgical History:  Procedure Laterality Date   ABDOMINAL HYSTERECTOMY  1962   ovarian cancer   APPENDECTOMY     breast biopsy- benign     BREAST CYST EXCISION Left    bunion/morton's neuroma  4/07   right foot    carpal tunnel release-right     CHOLECYSTECTOMY  1994   colitis transfusion  hosp at 25   COLONOSCOPY     very difficul- supp with [facility] past   EGD tight ues sphincter  7/09   with dialtion   FOOT SURGERY  10/09   TMT fusion and bunionectomy   HEMORRHOID SURGERY     INCISIONAL HERNIA REPAIR     after ccy   left knee surgery     left shoulder replacement     stress cardiolite  9/10   VIDEO BRONCHOSCOPY  04/22/2012   Procedure: VIDEO BRONCHOSCOPY WITH FLUORO;  Surgeon: Lupita Leash, MD;  Location: [facility];  Service: Cardiopulmonary;  Laterality: Bilateral;    Social History   Tobacco Use   Smoking status: Never   Smokeless tobacco: Never   Tobacco comments:    smoking cessation materials not required  Vaping Use   Vaping Use: Never  used  Substance Use Topics   Alcohol use: Yes    Comment: social wine    Drug use: No     Medication list has been reviewed and updated.  Current Meds  Medication Sig   acetaminophen (TYLENOL) 500 MG tablet Take 500 mg by mouth every 6 (six) hours as needed.   albuterol (VENTOLIN HFA) 108 (90 Base) MCG/ACT inhaler Inhale 2 puffs into the lungs every 6 (six) hours as needed for wheezing or shortness of breath.   ALPRAZolam (XANAX) 0.25 MG tablet Take 1 tablet (0.25 mg total) by mouth at bedtime as needed for anxiety.   amitriptyline (ELAVIL) 50 MG tablet Take 1 tablet (50 mg total) by mouth at bedtime.   bisoprolol (ZEBETA) 5 MG tablet Take 1 tablet (5 mg total) by mouth in the morning and at bedtime.   buPROPion (WELLBUTRIN SR) 150 MG 12 hr tablet Take 2 tablets (300 mg total) by mouth daily.   Cholecalciferol (VITAMIN D3) 2000 units TABS Take by mouth.   gabapentin (NEURONTIN) 100 MG capsule Take 2 capsules (200 mg total) by mouth at bedtime.   pantoprazole (PROTONIX) 40 MG tablet Take 1 tablet (40 mg total) by mouth 2 (two) times daily.   triamterene-hydrochlorothiazide (MAXZIDE-25) 37.5-25 MG tablet Take 1 tablet by mouth daily.   vitamin B-12 (CYANOCOBALAMIN) 1000 MCG tablet Take 1,000 mcg by mouth daily.   [DISCONTINUED] HYDROcodone bit-homatropine (HYCODAN) 5-1.5 MG/5ML syrup Take 5 mLs by mouth every 6 (six) hours as needed for up to 10 days for cough.   [DISCONTINUED] levofloxacin (LEVAQUIN) 500 MG tablet Take 1 tablet (500 mg total) by mouth daily for 7 days.       08/06/2022    9:04 AM 05/27/2022    1:40 PM 02/13/2022   10:45 AM 08/13/2021   10:17 AM  GAD 7 : Generalized Anxiety Score  Nervous, Anxious, on Edge 0 0 0 0  Control/stop worrying 0 0 0 0  Worry too much - different things 0 0 0 0  Trouble relaxing 0 0 0 0  Restless 0 0 0 0  Easily annoyed or irritable 0 0 0 0  Afraid - awful might happen 0 0 0 0  Total GAD 7 Score 0 0 0 0  Anxiety Difficulty Not difficult  at all Not difficult at all Not difficult at all Not difficult at all       08/06/2022    9:03 AM 05/27/2022    1:40 PM 02/13/2022   10:45 AM  Depression screen PHQ 2/9  Decreased Interest 0 0 0  Down, Depressed, Hopeless 0 0 0  PHQ - 2 Score 0  0 0  Altered sleeping 2 0 0  Tired, decreased energy 2 0 0  Change in appetite 0 0 0  Feeling bad or failure about yourself  0 0 0  Trouble concentrating 0 0 0  Moving slowly or fidgety/restless 0 0 0  Suicidal thoughts 0 0 0  PHQ-9 Score 4 0 0  Difficult doing work/chores Not difficult at all Not difficult at all Not difficult at all    BP Readings from Last 3 Encounters:  08/12/22 110/78  08/07/22 119/60  08/06/22 (!) 94/50    Physical Exam Constitutional:      Appearance: She is ill-appearing.  Cardiovascular:     Rate and Rhythm: Normal rate and regular rhythm.  Pulmonary:     Effort: Pulmonary effort is normal.     Breath sounds: Examination of the left-middle field reveals rhonchi. Examination of the left-lower field reveals rhonchi. Rhonchi present.  Musculoskeletal:     Cervical back: Normal range of motion.  Lymphadenopathy:     Cervical: No cervical adenopathy.  Neurological:     Mental Status: She is alert.     Wt Readings from Last 3 Encounters:  08/12/22 127 lb (57.6 kg)  08/07/22 129 lb (58.5 kg)  08/06/22 131 lb (59.4 kg)    BP 110/78   Pulse 96   Temp 97.7 F (36.5 C) (Oral)   Ht 5\' 2"  (1.575 m)   Wt 127 lb (57.6 kg)   SpO2 (!) 89%   BMI 23.23 kg/m   Assessment and Plan:  Problem List Items Addressed This Visit   None Visit Diagnoses     Community acquired pneumonia of left lower lobe of lung    -  Primary   She has not improved much but is drinking well, tolerating meds, alert Add mucinex bid; protein shakes Additional 5 days of antibiotics; refill cough syrup   Relevant Medications   levofloxacin (LEVAQUIN) 500 MG tablet   HYDROcodone bit-homatropine (HYCODAN) 5-1.5 MG/5ML syrup   Other  Relevant Orders   CBC with Differential/Platelet   Basic metabolic panel       No follow-ups on file.   Partially dictated using Dragon software, any errors are not intentional.  Reubin Milan, MD [facility] [place], [place]

## 2022-08-13 LAB — CBC WITH DIFFERENTIAL/PLATELET
Basophils Absolute: 0.1 10*3/uL (ref 0.0–0.2)
Basos: 1 %
EOS (ABSOLUTE): 0.3 10*3/uL (ref 0.0–0.4)
Eos: 3 %
Hematocrit: 29.8 % — ABNORMAL LOW (ref 34.0–46.6)
Hemoglobin: 9.9 g/dL — ABNORMAL LOW (ref 11.1–15.9)
Immature Grans (Abs): 0.4 10*3/uL — ABNORMAL HIGH (ref 0.0–0.1)
Immature Granulocytes: 3 %
Lymphocytes Absolute: 1.1 10*3/uL (ref 0.7–3.1)
Lymphs: 10 %
MCH: 32.6 pg (ref 26.6–33.0)
MCHC: 33.2 g/dL (ref 31.5–35.7)
MCV: 98 fL — ABNORMAL HIGH (ref 79–97)
Monocytes Absolute: 0.7 10*3/uL (ref 0.1–0.9)
Monocytes: 6 %
Neutrophils Absolute: 8.9 10*3/uL — ABNORMAL HIGH (ref 1.4–7.0)
Neutrophils: 77 %
Platelets: 688 10*3/uL — ABNORMAL HIGH (ref 150–450)
RBC: 3.04 x10E6/uL — ABNORMAL LOW (ref 3.77–5.28)
RDW: 12.2 % (ref 11.7–15.4)
WBC: 11.4 10*3/uL — ABNORMAL HIGH (ref 3.4–10.8)

## 2022-08-13 LAB — BASIC METABOLIC PANEL
BUN/Creatinine Ratio: 17 (ref 12–28)
BUN: 30 mg/dL — ABNORMAL HIGH (ref 8–27)
CO2: 18 mmol/L — ABNORMAL LOW (ref 20–29)
Calcium: 9.8 mg/dL (ref 8.7–10.3)
Chloride: 103 mmol/L (ref 96–106)
Creatinine, Ser: 1.79 mg/dL — ABNORMAL HIGH (ref 0.57–1.00)
Glucose: 106 mg/dL — ABNORMAL HIGH (ref 70–99)
Potassium: 4.9 mmol/L (ref 3.5–5.2)
Sodium: 140 mmol/L (ref 134–144)
eGFR: 28 mL/min/{1.73_m2} — ABNORMAL LOW (ref >59)

## 2022-08-14 ENCOUNTER — Telehealth: Payer: Self-pay | Admitting: Internal Medicine

## 2022-08-14 NOTE — Telephone Encounter (Signed)
Copied from CRM [id number]. Topic: General - Other >> Aug 14, 2022 11:10 AM Shiquita J wrote: Reason for CRM: pt called in to request a call back from providers CMA. Pt says that she had a hospital follow up on Monday and is scheduled to have her CPE on this Friday. Pt inquired about making a change to her CPE, advised pt of providers next cpe available. Pt would like to speak with CMA before making changes to her appt.   Please assist pt further.

## 2022-08-14 NOTE — Telephone Encounter (Signed)
Tried to call patient several times- "call cannot be completed at this time." If patient returns call, [facility] can move patient upcoming appts for a CPE to next available per Dr Judithann Graves. - Jodi Bartlett

## 2022-08-15 ENCOUNTER — Telehealth: Payer: Self-pay | Admitting: Internal Medicine

## 2022-08-15 NOTE — Telephone Encounter (Signed)
Patient is weak and have questions about her appointment tomorrow. Please call patient.

## 2022-08-15 NOTE — Telephone Encounter (Signed)
Per Dr Judithann Graves- canceled patients appt for tomorrow and rescheduled for August for CPE. - Jodi Bartlett

## 2022-08-16 ENCOUNTER — Encounter: Payer: PPO | Admitting: Internal Medicine

## 2022-09-30 ENCOUNTER — Other Ambulatory Visit: Payer: Self-pay | Admitting: Internal Medicine

## 2022-09-30 DIAGNOSIS — F325 Major depressive disorder, single episode, in full remission: Secondary | ICD-10-CM

## 2022-10-23 ENCOUNTER — Ambulatory Visit (INDEPENDENT_AMBULATORY_CARE_PROVIDER_SITE_OTHER): Payer: HMO

## 2022-10-23 VITALS — Ht 62.0 in | Wt 130.0 lb

## 2022-10-23 DIAGNOSIS — Z0000 Encounter for general adult medical examination without abnormal findings: Secondary | ICD-10-CM | POA: Diagnosis not present

## 2022-10-23 NOTE — Patient Instructions (Signed)
Jodi Bartlett , Thank you for taking time to come for your Medicare Wellness Visit. I appreciate your ongoing commitment to your health goals. Please review the following plan we discussed and let me know if I can assist you in the future.   These are the goals we discussed:  Goals       Exercise 3x per week (30 min per time) (pt-stated)      "Get back into walking"        This is a list of the screening recommended for you and due dates:  Health Maintenance  Topic Date Due   Mammogram  11/06/2022*   Zoster (Shingles) Vaccine (1 of 2) 11/07/2022*   COVID-19 Vaccine (4 - 2023-24 season) 11/08/2022*   Flu Shot  10/31/2022   Medicare Annual Wellness Visit  10/23/2023   Pneumonia Vaccine  Completed   DEXA scan (bone density measurement)  Completed   HPV Vaccine  Aged Out   DTaP/Tdap/Td vaccine  Discontinued  *Topic was postponed. The date shown is not the original due date.   Health Maintenance After Age 44 After age 63, you are at a higher risk for certain long-term diseases and infections as well as injuries from falls. Falls are a major cause of broken bones and head injuries in people who are older than age 23. Getting regular preventive care can help to keep you healthy and well. Preventive care includes getting regular testing and making lifestyle changes as recommended by your health care provider. Talk with your health care provider about: Which screenings and tests you should have. A screening is a test that checks for a disease when you have no symptoms. A diet and exercise plan that is right for you. What should I know about screenings and tests to prevent falls? Screening and testing are the best ways to find a health problem early. Early diagnosis and treatment give you the best chance of managing medical conditions that are common after age 55. Certain conditions and lifestyle choices may make you more likely to have a fall. Your health care provider may recommend: Regular  vision checks. Poor vision and conditions such as cataracts can make you more likely to have a fall. If you wear glasses, make sure to get your prescription updated if your vision changes. Medicine review. Work with your health care provider to regularly review all of the medicines you are taking, including over-the-counter medicines. Ask your health care provider about any side effects that may make you more likely to have a fall. Tell your health care provider if any medicines that you take make you feel dizzy or sleepy. Strength and balance checks. Your health care provider may recommend certain tests to check your strength and balance while standing, walking, or changing positions. Foot health exam. Foot pain and numbness, as well as not wearing proper footwear, can make you more likely to have a fall. Screenings, including: Osteoporosis screening. Osteoporosis is a condition that causes the bones to get weaker and break more easily. Blood pressure screening. Blood pressure changes and medicines to control blood pressure can make you feel dizzy. Depression screening. You may be more likely to have a fall if you have a fear of falling, feel depressed, or feel unable to do activities that you used to do. Alcohol use screening. Using too much alcohol can affect your balance and may make you more likely to have a fall. Follow these instructions at home: Lifestyle Do not drink alcohol if: Your health  care provider tells you not to drink. If you drink alcohol: Limit how much you have to: 0-1 drink a day for women. 0-2 drinks a day for men. Know how much alcohol is in your drink. In the [place] one drink equals one 12 oz bottle of beer (355 mL), one 5 oz glass of wine (148 mL), or one 1 oz glass of hard liquor (44 mL). Do not use any products that contain nicotine or tobacco. These products include cigarettes, chewing tobacco, and vaping devices, such as e-cigarettes. If you need help quitting, ask  your health care provider. Activity  Follow a regular exercise program to stay fit. This will help you maintain your balance. Ask your health care provider what types of exercise are appropriate for you. If you need a cane or walker, use it as recommended by your health care provider. Wear supportive shoes that have nonskid soles. Safety  Remove any tripping hazards, such as rugs, cords, and clutter. Install safety equipment such as grab bars in bathrooms and safety rails on stairs. Keep rooms and walkways well-lit. General instructions Talk with your health care provider about your risks for falling. Tell your health care provider if: You fall. Be sure to tell your health care provider about all falls, even ones that seem minor. You feel dizzy, tiredness (fatigue), or off-balance. Take over-the-counter and prescription medicines only as told by your health care provider. These include supplements. Eat a healthy diet and maintain a healthy weight. A healthy diet includes low-fat dairy products, low-fat (lean) meats, and fiber from whole grains, beans, and lots of fruits and vegetables. Stay current with your vaccines. Schedule regular health, dental, and eye exams. Summary Having a healthy lifestyle and getting preventive care can help to protect your health and wellness after age 68. Screening and testing are the best way to find a health problem early and help you avoid having a fall. Early diagnosis and treatment give you the best chance for managing medical conditions that are more common for people who are older than age 34. Falls are a major cause of broken bones and head injuries in people who are older than age 59. Take precautions to prevent a fall at home. Work with your health care provider to learn what changes you can make to improve your health and wellness and to prevent falls. This information is not intended to replace advice given to you by your health care provider. Make  sure you discuss any questions you have with your health care provider. Document Revised: 08/07/2020 Document Reviewed: 08/07/2020 Elsevier Patient Education  2024 [employer].

## 2022-10-23 NOTE — Progress Notes (Signed)
Subjective:   Jodi Bartlett is a 87 y.o. female who presents for Medicare Annual (Subsequent) preventive examination.  Visit Complete: Virtual  I connected with  Jodi Bartlett on 10/23/22 by a audio enabled telemedicine application and verified that I am speaking with the correct person using two identifiers.  Patient Location: Home  Provider Location: Office/Clinic  I discussed the limitations of evaluation and management by telemedicine. The patient expressed understanding and agreed to proceed.  Cardiac Risk Factors include: advanced age (>8men, >24 women);hypertension     Objective:    Today's Vitals   10/23/22 0959 10/23/22 1004  Weight: 130 lb (59 kg)   Height: 5\' 2"  (1.575 m)   PainSc:  5    Body mass index is 23.78 kg/m.     10/23/2022   10:14 AM 08/07/2022    1:42 PM 05/29/2020    2:22 PM 05/26/2019    2:05 PM 01/28/2019    3:51 PM 05/18/2018    2:21 PM 05/14/2017    9:50 AM  Advanced Directives  Does Patient Have a Medical Advance Directive? No No No Yes No No No  Type of Theme park manager;Living will     Does patient want to make changes to medical advance directive?    Yes (MAU/Ambulatory/Procedural Areas - Information given)     Copy of Healthcare Power of Attorney in Chart?    No - copy requested     Would patient like information on creating a medical advance directive? No - Patient declined No - Patient declined Yes (MAU/Ambulatory/Procedural Areas - Information given)   Yes (MAU/Ambulatory/Procedural Areas - Information given) --    Current Medications (verified) Outpatient Encounter Medications as of 10/23/2022  Medication Sig   acetaminophen (TYLENOL) 500 MG tablet Take 500 mg by mouth every 6 (six) hours as needed.   albuterol (VENTOLIN HFA) 108 (90 Base) MCG/ACT inhaler Inhale 2 puffs into the lungs every 6 (six) hours as needed for wheezing or shortness of breath.   ALPRAZolam (XANAX) 0.25 MG tablet Take 1 tablet (0.25  mg total) by mouth at bedtime as needed for anxiety.   amitriptyline (ELAVIL) 50 MG tablet Take 1 tablet (50 mg total) by mouth at bedtime.   bisoprolol (ZEBETA) 5 MG tablet Take 1 tablet (5 mg total) by mouth in the morning and at bedtime.   buPROPion (WELLBUTRIN SR) 150 MG 12 hr tablet TAKE 2 TABLETS BY MOUTH DAILY.   Cholecalciferol (VITAMIN D3) 2000 units TABS Take by mouth.   gabapentin (NEURONTIN) 100 MG capsule Take 2 capsules (200 mg total) by mouth at bedtime.   pantoprazole (PROTONIX) 40 MG tablet Take 1 tablet (40 mg total) by mouth 2 (two) times daily.   triamterene-hydrochlorothiazide (MAXZIDE-25) 37.5-25 MG tablet Take 1 tablet by mouth daily.   vitamin B-12 (CYANOCOBALAMIN) 1000 MCG tablet Take 1,000 mcg by mouth daily.   No facility-administered encounter medications on file as of 10/23/2022.    Allergies (verified) Patient has no known allergies.   History: Past Medical History:  Diagnosis Date   Abnormality of lung on CXR 01/21/2012   Noted on CXR 2013 - CT suggested a mass but follow up scans showed resolution Last CXR 10/2015 - mild apical scarring and mild COPD   Allergy    Anemia    hx of   Anxiety    Arthritis    Back pain    Blood transfusion without reported diagnosis    Bunion  10/19/2007   Depressed    Diverticulosis 2010   Colonoscopy   Dizziness    vertigo   GERD (gastroesophageal reflux disease)    Hiatal hernia    EGD    HTN (hypertension)    Hx of colonic polyps 2010   Colonoscopy   IBS (irritable bowel syndrome)    Interstitial cystitis    OA (ocular albinism) (HCC)    Ovarian cancer (HCC)    Personal history of chemotherapy    Stricture and stenosis of esophagus 2009   EGD    UTI (lower urinary tract infection)    hx of     Past Surgical History:  Procedure Laterality Date   ABDOMINAL HYSTERECTOMY  1962   ovarian cancer   APPENDECTOMY     breast biopsy- benign     BREAST CYST EXCISION Left    bunion/morton's neuroma  4/07   right  foot    carpal tunnel release-right     CHOLECYSTECTOMY  1994   colitis transfusion     hosp at 25   COLONOSCOPY     very difficul- supp with BE past   EGD tight ues sphincter  7/09   with dialtion   FOOT SURGERY  10/09   TMT fusion and bunionectomy   HEMORRHOID SURGERY     INCISIONAL HERNIA REPAIR     after [facility]cy   left knee surgery     left shoulder replacement     stress cardiolite  9/10   VIDEO BRONCHOSCOPY  04/22/2012   Procedure: VIDEO BRONCHOSCOPY WITH FLUORO;  Surgeon: Lupita Leash, MD;  Location: [facility];  Service: Cardiopulmonary;  Laterality: Bilateral;   Family History  Problem Relation Age of Onset   Coronary artery disease Father    Hypertension Father    Heart disease Father        CAD   Stroke Mother    Hypertension Sister    Hyperlipidemia Sister    Ovarian cancer Maternal Grandmother    Colon cancer Neg Hx    Esophageal cancer Neg Hx    Rectal cancer Neg Hx    Stomach cancer Neg Hx    Breast cancer Neg Hx    Social History   Socioeconomic History   Marital status: Widowed    Spouse name: Not on file   Number of children: 1   Years of education: Not on file   Highest education level: Associate degree: occupational, Scientist, product/process development, or vocational program  Occupational History   Occupation: COSMETOLOGIST    Comment: Retired   Tobacco Use   Smoking status: Never   Smokeless tobacco: Never   Tobacco comments:    smoking cessation materials not required  Vaping Use   Vaping status: Never Used  Substance and Sexual Activity   Alcohol use: Yes    Comment: social wine    Drug use: No   Sexual activity: Not Currently  Other Topics Concern   Not on file  Social History Narrative   1 daughter. Self employed, retired Interior and spatial designer. Daily caffeine. Lives alone.    Social Determinants of Health   Financial Resource Strain: Low Risk  (10/23/2022)   Overall Financial Resource Strain (CARDIA)    Difficulty of Paying Living Expenses: Not hard at all   Food Insecurity: No Food Insecurity (10/23/2022)   Hunger Vital Sign    Worried About Running Out of Food in the Last Year: Never true    Ran Out of Food in the Last Year: Never true  Transportation Needs: No Transportation Needs (10/23/2022)   PRAPARE - Administrator, Civil Service (Medical): No    Lack of Transportation (Non-Medical): No  Physical Activity: Insufficiently Active (10/23/2022)   Exercise Vital Sign    Days of Exercise per Week: 3 days    Minutes of Exercise per Session: 20 min  Stress: No Stress Concern Present (10/23/2022)   [employer] of Occupational Health - Occupational Stress Questionnaire    Feeling of Stress : Not at all  Social Connections: Socially Isolated (10/23/2022)   Social Connection and Isolation Panel [NHANES]    Frequency of Communication with Friends and Family: More than three times a week    Frequency of Social Gatherings with Friends and Family: More than three times a week    Attends Religious Services: Never    Database administrator or Organizations: No    Attends Banker Meetings: Never    Marital Status: Widowed    Tobacco Counseling Counseling given: Yes Tobacco comments: smoking cessation materials not required   Clinical Intake:  Pre-visit preparation completed: No  Pain : 0-10 Pain Score: 5  Pain Type: Chronic pain Pain Location: Back Pain Orientation: Lower Pain Radiating Towards: left leg Pain Descriptors / Indicators: Burning, Pins and needles Pain Onset: More than a month ago (spoke to doctor at last visit) Pain Frequency: Intermittent (gets worse when stands still)     BMI - recorded: 23.78 Nutritional Status: BMI of 19-24  Normal Nutritional Risks: None Diabetes: No  How often do you need to have someone help you when you read instructions, pamphlets, or other written materials from your doctor or pharmacy?: 1 - Never  Interpreter Needed?: No  Information entered by :: Arthur Holms   Activities of Daily Living    10/23/2022   10:07 AM  In your present state of health, do you have any difficulty performing the following activities:  Hearing? 0  Vision? 0  Difficulty concentrating or making decisions? 0  Walking or climbing stairs? 0  Dressing or bathing? 0  Doing errands, shopping? 0  Preparing Food and eating ? N  Using the Toilet? N  In the past six months, have you accidently leaked urine? N  Do you have problems with loss of bowel control? N  Managing your Medications? N  Managing your Finances? N  Housekeeping or managing your Housekeeping? N    Patient Care Team: Reubin Milan, MD as PCP - General (Internal Medicine) Kennedy Bucker, MD as Consulting Physician (Orthopedic Surgery) Midge Minium, MD as Consulting Physician (Gastroenterology) Ronnette Juniper as Physician Assistant (Orthopedic Surgery) Jules Husbands, MD as Attending Physician (Psychiatry) Merri Ray, MD as Referring Physician (Physical Medicine and Rehabilitation) Salena Saner, MD as Consulting Physician (Pulmonary Disease) Antonieta Iba, MD as Consulting Physician (Cardiology)  Indicate any recent Medical Services you may have received from other than [facility] providers in the past year (date may be approximate).     Assessment:   This is a routine wellness examination for Jodi Bartlett.  Hearing/Vision screen Hearing Screening - Comments:: Able to hear over the phone Vision Screening - Comments:: Had cataract surg- wears readers  Dietary issues and exercise activities discussed:     Goals Addressed               This Visit's Progress     Exercise 3x per week (30 min per time) (pt-stated)        "Get back into  walking"       Depression Screen    10/23/2022   10:20 AM 10/23/2022   10:13 AM 08/06/2022    9:03 AM 05/27/2022    1:40 PM 02/13/2022   10:45 AM 08/13/2021   10:17 AM 02/12/2021    1:36 PM  PHQ 2/9 Scores  PHQ - 2 Score 0 0 0 0 0 0  0  PHQ- 9 Score   4 0 0 1 0    Fall Risk    10/23/2022   10:16 AM 08/06/2022    9:03 AM 05/27/2022    1:41 PM 02/28/2022    9:00 AM 02/13/2022   10:46 AM  Fall Risk   Falls in the past year? 1 0 0 0 0  Number falls in past yr: 0 0 0 0 0  Injury with Fall? 0 0 0 0 0  Comment just a bruise on hip      Risk for fall due to : History of fall(s);Other (Comment) No Fall Risks No Fall Risks No Fall Risks No Fall Risks  Risk for fall due to: Comment "get lightheaded and fall"      Follow up Education provided;Falls prevention discussed Falls evaluation completed Falls evaluation completed Falls evaluation completed Falls evaluation completed    MEDICARE RISK AT HOME:  Medicare Risk at Home - 10/23/22 1018     Any stairs in or around the home? Yes    If so, are there any without handrails? No    Home free of loose throw rugs in walkways, pet beds, electrical cords, etc? Yes    Adequate lighting in your home to reduce risk of falls? Yes    Life alert? No   advised to get   Use of a cane, walker or w/c? Yes    Grab bars in the bathroom? No   advised to get- pt stated "has vanity to hold on to"   Shower chair or bench in shower? No   advised to get   Elevated toilet seat or a handicapped toilet? No              Cognitive Function:        10/23/2022   10:20 AM 05/18/2018    2:30 PM 05/21/2016   11:00 AM  6CIT Screen  What Year? 0 points 0 points 0 points  What month? 0 points 0 points 0 points  What time? 0 points 0 points 0 points  Count back from 20 0 points 0 points 0 points  Months in reverse 0 points 0 points 0 points  Repeat phrase 0 points 0 points 0 points  Total Score 0 points 0 points 0 points    Immunizations Immunization History  Administered Date(s) Administered   Fluad Quad(high Dose 65+) 02/03/2019, 01/11/2020, 02/12/2021, 02/28/2022   Influenza Split 01/21/2012   Influenza, High Dose Seasonal PF 12/31/2017   Influenza,inj,Quad PF,6+ Mos 03/16/2014,  01/01/2016   PFIZER(Purple Top)SARS-COV-2 Vaccination 04/15/2019, 05/06/2019, 01/17/2020   Pneumococcal Conjugate-13 05/21/2016   Pneumococcal Polysaccharide-23 08/03/2013   Td 04/01/1994, 11/18/2007    TDAP status: Due, Education has been provided regarding the importance of this vaccine. Advised may receive this vaccine at local pharmacy or Health Dept. Aware to provide a copy of the vaccination record if obtained from local pharmacy or Health Dept. Verbalized acceptance and understanding.  Flu Vaccine status: Up to date  Pneumococcal vaccine status: Due, Education has been provided regarding the importance of this vaccine. Advised may receive this vaccine at  local pharmacy or Health Dept. Aware to provide a copy of the vaccination record if obtained from local pharmacy or Health Dept. Verbalized acceptance and understanding.  Covid-19 vaccine status: Declined, Education has been provided regarding the importance of this vaccine but patient still declined. Advised may receive this vaccine at local pharmacy or Health Dept.or vaccine clinic. Aware to provide a copy of the vaccination record if obtained from local pharmacy or Health Dept. Verbalized acceptance and understanding.  Qualifies for Shingles Vaccine? Yes   Zostavax completed No   Shingrix Completed?: No.    Education has been provided regarding the importance of this vaccine. Patient has been advised to call insurance company to determine out of pocket expense if they have not yet received this vaccine. Advised may also receive vaccine at local pharmacy or Health Dept. Verbalized acceptance and understanding.  Screening Tests Health Maintenance  Topic Date Due   MAMMOGRAM  11/06/2022 (Originally 06/20/2021)   Zoster Vaccines- Shingrix (1 of 2) 11/07/2022 (Originally [birth year]/1988)   COVID-19 Vaccine (4 - 2023-24 season) 11/08/2022 (Originally 11/30/2021)   INFLUENZA VACCINE  10/31/2022   Medicare Annual Wellness (AWV)  10/23/2023    Pneumonia Vaccine 20+ Years old  Completed   DEXA SCAN  Completed   HPV VACCINES  Aged Out   DTaP/Tdap/Td  Discontinued    Health Maintenance  There are no preventive care reminders to display for this patient.   Colorectal cancer screening: Type of screening: Colonoscopy. Completed 01/28/12. Repeat every aged out years  Mammogram status: Completed 07/03/20. Repeat every year  Bone Density status: Completed 06/20/20. Results reflect: Bone density results: OSTEOPENIA. Repeat every 2 years. And osteoporosis- gave number to call and reschedule.  Lung Cancer Screening: (Low Dose CT Chest recommended if Age 12-80 years, 20 pack-year currently smoking OR have quit w/in 15years.) does not qualify.    Additional Screening:  Hepatitis C Screening: does qualify  Vision Screening: Recommended annual ophthalmology exams for early detection of glaucoma and other disorders of the eye. Is the patient up to date with their annual eye exam?  Yes  Who is the provider or what is the name of the office in which the patient attends annual eye exams? Dr Druscilla Brownie   Dental Screening: Recommended annual dental exams for proper oral hygiene- sees dentist yearly   Community Resource Referral / Chronic Care Management: CRR required this visit?  No   CCM required this visit?  No     Plan:     I have personally reviewed and noted the following in the patient's chart:   Medical and social history Use of alcohol, tobacco or illicit drugs  Current medications and supplements including opioid prescriptions. Patient is not currently taking opioid prescriptions. Functional ability and status Nutritional status Physical activity Advanced directives List of other physicians Hospitalizations, surgeries, and ER visits in previous 12 months Vitals Screenings to include cognitive, depression, and falls Referrals and appointments  In addition, I have reviewed and discussed with patient certain preventive  protocols, quality metrics, and best practice recommendations. A written personalized care plan for preventive services as well as general preventive health recommendations were provided to patient.     Everitt Amber   10/23/2022   After Visit Summary: (MyChart) Due to this being a telephonic visit, the after visit summary with patients personalized plan was offered to patient via MyChart   Nurse Notes: gave patient number to get mammogram and bone density rescheduled.

## 2022-11-02 ENCOUNTER — Other Ambulatory Visit: Payer: Self-pay | Admitting: Internal Medicine

## 2022-11-02 DIAGNOSIS — I10 Essential (primary) hypertension: Secondary | ICD-10-CM

## 2022-11-05 ENCOUNTER — Ambulatory Visit (INDEPENDENT_AMBULATORY_CARE_PROVIDER_SITE_OTHER): Payer: HMO | Admitting: Internal Medicine

## 2022-11-05 ENCOUNTER — Encounter: Payer: Self-pay | Admitting: Internal Medicine

## 2022-11-05 VITALS — BP 112/62 | HR 78 | Ht 62.0 in | Wt 134.0 lb

## 2022-11-05 DIAGNOSIS — M810 Age-related osteoporosis without current pathological fracture: Secondary | ICD-10-CM

## 2022-11-05 DIAGNOSIS — F325 Major depressive disorder, single episode, in full remission: Secondary | ICD-10-CM

## 2022-11-05 DIAGNOSIS — I10 Essential (primary) hypertension: Secondary | ICD-10-CM

## 2022-11-05 DIAGNOSIS — K219 Gastro-esophageal reflux disease without esophagitis: Secondary | ICD-10-CM | POA: Diagnosis not present

## 2022-11-05 DIAGNOSIS — Z0000 Encounter for general adult medical examination without abnormal findings: Secondary | ICD-10-CM | POA: Diagnosis not present

## 2022-11-05 DIAGNOSIS — Z1231 Encounter for screening mammogram for malignant neoplasm of breast: Secondary | ICD-10-CM | POA: Diagnosis not present

## 2022-11-05 DIAGNOSIS — I700 Atherosclerosis of aorta: Secondary | ICD-10-CM

## 2022-11-05 DIAGNOSIS — N184 Chronic kidney disease, stage 4 (severe): Secondary | ICD-10-CM

## 2022-11-05 DIAGNOSIS — Z23 Encounter for immunization: Secondary | ICD-10-CM | POA: Diagnosis not present

## 2022-11-05 MED ORDER — TRIAMTERENE-HCTZ 37.5-25 MG PO TABS: 1.0000 | ORAL_TABLET | Freq: Every day | ORAL | 1 refills | Status: DC

## 2022-11-05 MED ORDER — AMITRIPTYLINE HCL 50 MG PO TABS: 50.0000 mg | ORAL_TABLET | Freq: Every day | ORAL | 1 refills | Status: DC

## 2022-11-05 NOTE — Assessment & Plan Note (Addendum)
Reflux symptoms are minimal on current therapy - omeprazole bid. Has some recurrent gerd treated with antacids PRN No red flag signs such as weight loss, n/v, melena She has some pill dysphagia intermittently. Had had EGD and dilatation in the past

## 2022-11-05 NOTE — Assessment & Plan Note (Signed)
Monitoring regularly GFR  28 in May

## 2022-11-05 NOTE — Assessment & Plan Note (Signed)
Due for DEXA Taking vitamin D daily

## 2022-11-05 NOTE — Assessment & Plan Note (Signed)
Clinically stable on current regimen with good control of symptoms, No SI or HI. No change in management at this time. Continue Bupropion and Elavil

## 2022-11-05 NOTE — Patient Instructions (Signed)
Call [facility] to schedule your mammogram and bone density at [phone].

## 2022-11-05 NOTE — Assessment & Plan Note (Signed)
Normal exam with stable BP on zebeta and hctz. No concerns or side effects to current medication. No change in regimen; continue low sodium diet.

## 2022-11-05 NOTE — Progress Notes (Signed)
Date:  11/05/2022   Name:  Jodi Bartlett   DOB:  April 05, 1935   MRN:  [id number]   Chief Complaint: Annual Exam  Jodi Bartlett is a 87 y.o. female who presents today for her Complete Annual Exam. She feels fairly well. She reports exercising - none. She reports she is sleeping fairly well. Breast complaints - none.  Mammogram: 2022 DEXA: 2022 Colonoscopy: 2013  Health Maintenance Due  Topic Date Due   INFLUENZA VACCINE  10/31/2022    Immunization History  Administered Date(s) Administered   Fluad Quad(high Dose 65+) 02/03/2019, 01/11/2020, 02/12/2021, 02/28/2022   Influenza Split 01/21/2012   Influenza, High Dose Seasonal PF 12/31/2017   Influenza,inj,Quad PF,6+ Mos 03/16/2014, 01/01/2016   PFIZER(Purple Top)SARS-COV-2 Vaccination 04/15/2019, 05/06/2019, 01/17/2020   PNEUMOCOCCAL CONJUGATE-20 11/05/2022   Pneumococcal Conjugate-13 05/21/2016   Pneumococcal Polysaccharide-23 08/03/2013   Td 04/01/1994, 11/18/2007     Hypertension This is a chronic problem. The problem is controlled. Pertinent negatives include no chest pain, headaches, palpitations or shortness of breath. Past treatments include beta blockers and diuretics. The current treatment provides significant improvement. Hypertensive end-organ damage includes kidney disease and CAD/MI. There is no history of CVA.  Gastroesophageal Reflux She complains of dysphagia and heartburn. She reports no abdominal pain, no chest pain, no coughing or no wheezing. This is a recurrent problem. The problem occurs occasionally. Pertinent negatives include no fatigue. She has tried a PPI and an antacid for the symptoms.    Lab Results  Component Value Date   NA 140 08/12/2022   K 4.9 08/12/2022   CO2 18 (L) 08/12/2022   GLUCOSE 106 (H) 08/12/2022   BUN 30 (H) 08/12/2022   CREATININE 1.79 (H) 08/12/2022   CALCIUM 9.8 08/12/2022   EGFR 28 (L) 08/12/2022   GFRNONAA 20 (L) 08/07/2022   Lab Results  Component Value Date   CHOL 180  08/13/2021   HDL 44 08/13/2021   LDLCALC 116 (H) 08/13/2021   TRIG 112 08/13/2021   CHOLHDL 4.1 08/13/2021   Lab Results  Component Value Date   TSH 4.270 08/13/2021   No results found for: "HGBA1C" Lab Results  Component Value Date   WBC 11.4 (H) 08/12/2022   HGB 9.9 (L) 08/12/2022   HCT 29.8 (L) 08/12/2022   MCV 98 (H) 08/12/2022   PLT 688 (H) 08/12/2022   Lab Results  Component Value Date   ALT 30 08/06/2022   AST 31 08/06/2022   ALKPHOS 214 (H) 08/06/2022   BILITOT 0.8 08/06/2022   Lab Results  Component Value Date   VD25OH 49.5 08/13/2021     Review of Systems  Constitutional:  Negative for chills, fatigue and fever.  HENT:  Positive for trouble swallowing. Negative for congestion, hearing loss and voice change.   Eyes:  Negative for visual disturbance.  Respiratory:  Negative for cough, chest tightness, shortness of breath and wheezing.   Cardiovascular:  Negative for chest pain, palpitations and leg swelling.  Gastrointestinal:  Positive for dysphagia and heartburn. Negative for abdominal pain, constipation, diarrhea and vomiting.  Endocrine: Negative for polydipsia and polyuria.  Genitourinary:  Negative for dysuria, frequency, genital sores, vaginal bleeding and vaginal discharge.  Musculoskeletal:  Positive for back pain. Negative for arthralgias, gait problem and joint swelling.  Skin:  Negative for color change and rash.  Neurological:  Negative for dizziness, tremors, light-headedness and headaches.  Hematological:  Negative for adenopathy. Does not bruise/bleed easily.  Psychiatric/Behavioral:  Negative for dysphoric mood and sleep  disturbance. The patient is not nervous/anxious.     Patient Active Problem List   Diagnosis Date Noted   Osteoporosis 06/20/2020   Chronic kidney disease, stage 3a (HCC) 01/12/2020   Migraine without aura and without status migrainosus, not intractable 02/03/2019   Inguinal hernia bilateral, non-recurrent 05/25/2018    Hiatal hernia 05/21/2018   Aortic atherosclerosis (HCC) 05/21/2018   Gastroesophageal reflux disease without esophagitis 12/31/2017   Degenerative lumbar spinal stenosis 01/01/2016   Primary osteoarthritis of both hips 01/01/2016   Insomnia 07/17/2012   Chest pain 06/23/2012   Bronchiectasis (HCC) 03/02/2012   Anemia 11/13/2011   Vitamin D deficiency 10/11/2008   B12 deficiency 10/04/2008   IBS 10/01/2007   Major depression, single episode, in complete remission (HCC) 07/31/2007   Essential hypertension 07/31/2007   Chronic back pain 07/31/2007   History of colonic polyps 07/31/2007    No Known Allergies  Past Surgical History:  Procedure Laterality Date   ABDOMINAL HYSTERECTOMY  1962   ovarian cancer   APPENDECTOMY     breast biopsy- benign     BREAST CYST EXCISION Left    bunion/morton's neuroma  4/07   right foot    carpal tunnel release-right     CHOLECYSTECTOMY  1994   colitis transfusion     hosp at 25   COLONOSCOPY     very difficul- supp with BE past   EGD tight ues sphincter  7/09   with dialtion   FOOT SURGERY  10/09   TMT fusion and bunionectomy   HEMORRHOID SURGERY     INCISIONAL HERNIA REPAIR     after ccy   left knee surgery     left shoulder replacement     stress cardiolite  9/10   VIDEO BRONCHOSCOPY  04/22/2012   Procedure: VIDEO BRONCHOSCOPY WITH FLUORO;  Surgeon: Lupita Leash, MD;  Location: [facility];  Service: Cardiopulmonary;  Laterality: Bilateral;    Social History   Tobacco Use   Smoking status: Never   Smokeless tobacco: Never   Tobacco comments:    smoking cessation materials not required  Vaping Use   Vaping status: Never Used  Substance Use Topics   Alcohol use: Yes    Comment: social wine    Drug use: No     Medication list has been reviewed and updated.  Current Meds  Medication Sig   acetaminophen (TYLENOL) 500 MG tablet Take 500 mg by mouth every 6 (six) hours as needed.   ALPRAZolam (XANAX) 0.25 MG tablet  Take 1 tablet (0.25 mg total) by mouth at bedtime as needed for anxiety.   bisoprolol (ZEBETA) 5 MG tablet TAKE 1 TABLET (5 MG TOTAL) BY MOUTH IN THE MORNING AND AT BEDTIME   buPROPion (WELLBUTRIN SR) 150 MG 12 hr tablet TAKE 2 TABLETS BY MOUTH DAILY.   Cholecalciferol (VITAMIN D3) 2000 units TABS Take by mouth.   gabapentin (NEURONTIN) 100 MG capsule Take 2 capsules (200 mg total) by mouth at bedtime.   pantoprazole (PROTONIX) 40 MG tablet Take 1 tablet (40 mg total) by mouth 2 (two) times daily.   vitamin B-12 (CYANOCOBALAMIN) 1000 MCG tablet Take 1,000 mcg by mouth daily.   [DISCONTINUED] amitriptyline (ELAVIL) 50 MG tablet Take 1 tablet (50 mg total) by mouth at bedtime.   [DISCONTINUED] triamterene-hydrochlorothiazide (MAXZIDE-25) 37.5-25 MG tablet Take 1 tablet by mouth daily.       11/05/2022   10:33 AM 08/06/2022    9:04 AM 05/27/2022    1:40 PM 02/13/2022  10:45 AM  GAD 7 : Generalized Anxiety Score  Nervous, Anxious, on Edge 0 0 0 0  Control/stop worrying 0 0 0 0  Worry too much - different things 0 0 0 0  Trouble relaxing 0 0 0 0  Restless 0 0 0 0  Easily annoyed or irritable 0 0 0 0  Afraid - awful might happen 0 0 0 0  Total GAD 7 Score 0 0 0 0  Anxiety Difficulty Not difficult at all Not difficult at all Not difficult at all Not difficult at all       11/05/2022   10:33 AM 10/23/2022   10:20 AM 10/23/2022   10:13 AM  Depression screen PHQ 2/9  Decreased Interest 0 0 0  Down, Depressed, Hopeless 0 0 0  PHQ - 2 Score 0 0 0  Altered sleeping 0    Tired, decreased energy 0    Change in appetite 0    Feeling bad or failure about yourself  0    Trouble concentrating 0    Moving slowly or fidgety/restless 0    Suicidal thoughts 0    PHQ-9 Score 0    Difficult doing work/chores Not difficult at all      BP Readings from Last 3 Encounters:  11/05/22 112/62  08/12/22 110/78  08/07/22 119/60    Physical Exam Vitals and nursing note reviewed.  Constitutional:       General: She is not in acute distress.    Appearance: She is well-developed.  HENT:     Head: Normocephalic and atraumatic.     Right Ear: Tympanic membrane and ear canal normal.     Left Ear: Tympanic membrane and ear canal normal.     Nose:     Right Sinus: No maxillary sinus tenderness.     Left Sinus: No maxillary sinus tenderness.  Eyes:     General: No scleral icterus.       Right eye: No discharge.        Left eye: No discharge.     Conjunctiva/sclera: Conjunctivae normal.  Neck:     Thyroid: No thyromegaly.     Vascular: No carotid bruit.  Cardiovascular:     Rate and Rhythm: Normal rate and regular rhythm.     Pulses: Normal pulses.     Heart sounds: Normal heart sounds.  Pulmonary:     Effort: Pulmonary effort is normal. No respiratory distress.     Breath sounds: No wheezing.  Chest:  Breasts:    Right: No mass, nipple discharge, skin change or tenderness.     Left: No mass, nipple discharge, skin change or tenderness.  Abdominal:     General: Bowel sounds are normal.     Palpations: Abdomen is soft.     Tenderness: There is no abdominal tenderness.  Musculoskeletal:     Cervical back: Normal range of motion. No erythema.     Right lower leg: No edema.     Left lower leg: No edema.  Lymphadenopathy:     Cervical: No cervical adenopathy.  Skin:    General: Skin is warm and dry.     Findings: No rash.  Neurological:     Mental Status: She is alert and oriented to person, place, and time.     Cranial Nerves: No cranial nerve deficit.     Sensory: No sensory deficit.     Deep Tendon Reflexes: Reflexes are normal and symmetric.  Psychiatric:  Attention and Perception: Attention normal.        Mood and Affect: Mood normal.     Wt Readings from Last 3 Encounters:  11/05/22 134 lb (60.8 kg)  10/23/22 130 lb (59 kg)  08/12/22 127 lb (57.6 kg)    BP 112/62   Pulse 78   Ht 5\' 2"  (1.575 m)   Wt 134 lb (60.8 kg)   SpO2 98%   BMI 24.51 kg/m    Assessment and Plan:  Problem List Items Addressed This Visit       Unprioritized   Osteoporosis (Chronic)    Due for DEXA Taking vitamin D daily      Relevant Orders   DG Bone Density   VITAMIN D 25 Hydroxy (Vit-D Deficiency, Fractures)   Major depression, single episode, in complete remission (HCC) (Chronic)    Clinically stable on current regimen with good control of symptoms, No SI or HI. No change in management at this time. Continue Bupropion and Elavil      Relevant Medications   amitriptyline (ELAVIL) 50 MG tablet   Gastroesophageal reflux disease without esophagitis (Chronic)    Reflux symptoms are minimal on current therapy - omeprazole bid. Has some recurrent gerd treated with antacids PRN No red flag signs such as weight loss, n/v, melena She has some pill dysphagia intermittently. Had had EGD and dilatation in the past       Relevant Orders   CBC with Differential/Platelet   Essential hypertension (Chronic)    Normal exam with stable BP on zebeta and hctz. No concerns or side effects to current medication. No change in regimen; continue low sodium diet.       Relevant Medications   triamterene-hydrochlorothiazide (MAXZIDE-25) 37.5-25 MG tablet   Other Relevant Orders   CBC with Differential/Platelet   Comprehensive metabolic panel   TSH   Chronic kidney disease, stage 3a (HCC) (Chronic)    Monitoring regularly GFR  28 in May      Aortic atherosclerosis (HCC) (Chronic)   Relevant Medications   triamterene-hydrochlorothiazide (MAXZIDE-25) 37.5-25 MG tablet   Other Relevant Orders   Lipid panel   Other Visit Diagnoses     Annual physical exam    -  Primary   Encounter for screening mammogram for breast cancer       Relevant Orders   MM 3D SCREENING MAMMOGRAM BILATERAL BREAST   Need for vaccination for pneumococcus       Relevant Orders   Pneumococcal conjugate vaccine 20-valent (Completed)       Return in about 6 months (around  05/08/2023) for HTN.    Reubin Milan, MD [facility] Primary Care and Sports Medicine [place]

## 2022-11-22 ENCOUNTER — Other Ambulatory Visit: Payer: Self-pay | Admitting: Internal Medicine

## 2022-11-22 DIAGNOSIS — K219 Gastro-esophageal reflux disease without esophagitis: Secondary | ICD-10-CM

## 2022-11-22 NOTE — Telephone Encounter (Signed)
Requested Prescriptions  Pending Prescriptions Disp Refills   pantoprazole (PROTONIX) 40 MG tablet [Pharmacy Med Name: PANTOPRAZOLE SOD DR 40 MG TAB] 180 tablet 3    Sig: TAKE 1 TABLET BY MOUTH TWICE A DAY     Gastroenterology: Proton Pump Inhibitors Passed - 11/22/2022  1:35 AM      Passed - Valid encounter within last 12 months    Recent Outpatient Visits           2 weeks ago Annual physical exam   [place] [facility] at [facility], Nyoka Cowden, MD   3 months ago Community acquired pneumonia of left lower lobe of lung   [place] [facility] at [facility], Nyoka Cowden, MD   3 months ago Community acquired pneumonia of left lower lobe of lung   [facility] at [facility], Nyoka Cowden, MD   5 months ago Diarrhea of presumed infectious origin   [facility] & [facility] Medicine at [facility], Nyoka Cowden, MD   8 months ago Essential hypertension   [facility] at [facility], Nyoka Cowden, MD       Future Appointments             In 11 months Judithann Graves, Nyoka Cowden, MD [facility] Primary Care & Sports Medicine at [facility], [facility]

## 2023-03-05 ENCOUNTER — Other Ambulatory Visit: Payer: Self-pay | Admitting: Internal Medicine

## 2023-03-05 DIAGNOSIS — M5442 Lumbago with sciatica, left side: Secondary | ICD-10-CM

## 2023-03-05 DIAGNOSIS — F325 Major depressive disorder, single episode, in full remission: Secondary | ICD-10-CM

## 2023-03-05 NOTE — Telephone Encounter (Signed)
Medication Refill -  Most Recent Primary Care Visit:  Provider: Reubin Milan  Department: [facility] [place]  Visit Type: PHYSICAL  Date: 11/05/2022  Medication: ALPRAZolam (XANAX) 0.25 MG tablet gabapentin (NEURONTIN) 100 MG capsule  Has the patient contacted their pharmacy? Yes (Agent: If yes, when and what did the pharmacy advise?) Pt told to contact office for provider approval.   Is this the correct pharmacy for this prescription? Yes   This is the patient's preferred pharmacy:  [id number] - [place], [place] - [address] [place] [address] Phone: [phone] Fax: [phone]  Has the prescription been filled recently? Yes  Is the patient out of the medication? Yes  Has the patient been seen for an appointment in the last year OR does the patient have an upcoming appointment? Yes  Can we respond through MyChart? No  Agent: Please be advised that Rx refills may take up to 3 business days. We ask that you follow-up with your pharmacy.

## 2023-03-06 MED ORDER — GABAPENTIN 100 MG PO CAPS
200.0000 mg | ORAL_CAPSULE | Freq: Every day | ORAL | 2 refills | Status: DC
Start: 2023-03-06 — End: 2023-11-18

## 2023-03-06 MED ORDER — ALPRAZOLAM 0.25 MG PO TABS
0.2500 mg | ORAL_TABLET | Freq: Every evening | ORAL | 1 refills | Status: DC | PRN
Start: 2023-03-06 — End: 2023-12-26

## 2023-03-06 NOTE — Telephone Encounter (Signed)
Requested Prescriptions  Pending Prescriptions Disp Refills   ALPRAZolam (XANAX) 0.25 MG tablet 30 tablet 5    Sig: Take 1 tablet (0.25 mg total) by mouth at bedtime as needed for anxiety.     Not Delegated - Psychiatry: Anxiolytics/Hypnotics 2 Failed - 03/05/2023  2:21 PM      Failed - This refill cannot be delegated      Failed - Urine Drug Screen completed in last 360 days      Passed - Patient is not pregnant      Passed - Valid encounter within last 6 months    Recent Outpatient Visits           4 months ago Annual physical exam   [place] [facility] at [facility], Nyoka Cowden, MD   6 months ago Community acquired pneumonia of left lower lobe of lung   [place] [facility] at [facility], Nyoka Cowden, MD   7 months ago Community acquired pneumonia of left lower lobe of lung   [facility] Primary Care & [facility] Medicine at [facility], Nyoka Cowden, MD   9 months ago Diarrhea of presumed infectious origin   [facility] Primary Care & [facility] at [facility], Nyoka Cowden, MD   1 year ago Essential hypertension   [place] [facility] at [facility], Nyoka Cowden, MD       Future Appointments             In 8 months Reubin Milan, MD [facility] Primary Care & Sports Medicine at [facility], [facility]             gabapentin (NEURONTIN) 100 MG capsule 180 capsule 1    Sig: Take 2 capsules (200 mg total) by mouth at bedtime.     Neurology: Anticonvulsants - gabapentin Failed - 03/05/2023  2:21 PM      Failed - Cr in normal range and within 360 days    Creatinine  Date Value Ref Range Status  12/10/2011 0.95 0.60 - 1.30 mg/dL Final   Creatinine, Ser  Date Value Ref Range Status  11/05/2022 1.21 (H) 0.57 - 1.00 mg/dL Final         Passed - Completed PHQ-2 or PHQ-9 in the last 360 days      Passed - Valid encounter within last 12 months     Recent Outpatient Visits           4 months ago Annual physical exam   [place] [facility] at [facility], Nyoka Cowden, MD   6 months ago Community acquired pneumonia of left lower lobe of lung   [place] [facility] Medicine at [facility], Nyoka Cowden, MD   7 months ago Community acquired pneumonia of left lower lobe of lung   [facility] Primary Care & Sports Medicine at [facility], Nyoka Cowden, MD   9 months ago Diarrhea of presumed infectious origin   [facility] at [facility], Nyoka Cowden, MD   1 year ago Essential hypertension   [place] [facility] Medicine at [facility], Nyoka Cowden, MD       Future Appointments             In 8 months Judithann Graves, Nyoka Cowden, MD [facility] Primary Care & Sports  Medicine at [facility], [facility]

## 2023-03-06 NOTE — Telephone Encounter (Signed)
Requested medication (s) are due for refill today: yes  Requested medication (s) are on the active medication list: yes  Last refill:  06/30/22 # 30/5  Future visit scheduled: yes  Notes to clinic:  Unable to refill per protocol, cannot delegate.      Requested Prescriptions  Pending Prescriptions Disp Refills   ALPRAZolam (XANAX) 0.25 MG tablet 30 tablet 5    Sig: Take 1 tablet (0.25 mg total) by mouth at bedtime as needed for anxiety.     Not Delegated - Psychiatry: Anxiolytics/Hypnotics 2 Failed - 03/05/2023  2:21 PM      Failed - This refill cannot be delegated      Failed - Urine Drug Screen completed in last 360 days      Passed - Patient is not pregnant      Passed - Valid encounter within last 6 months    Recent Outpatient Visits           4 months ago Annual physical exam   [place] [facility] at [facility], Nyoka Cowden, MD   6 months ago Community acquired pneumonia of left lower lobe of lung   [place] [facility] Medicine at [facility], Nyoka Cowden, MD   7 months ago Community acquired pneumonia of left lower lobe of lung   [facility] Primary Care & Sports Medicine at [facility], Nyoka Cowden, MD   9 months ago Diarrhea of presumed infectious origin   [facility] Care & Sports Medicine at [facility], Nyoka Cowden, MD   1 year ago Essential hypertension   [place] [facility] Medicine at [facility], Nyoka Cowden, MD       Future Appointments             In 8 months Reubin Milan, MD [facility] Health Primary Care & Sports Medicine at [facility] [place] [facility], [facility]            Signed Prescriptions Disp Refills   gabapentin (NEURONTIN) 100 MG capsule 180 capsule 2    Sig: Take 2 capsules (200 mg total) by mouth at bedtime.     Neurology: Anticonvulsants - gabapentin Failed - 03/05/2023  2:21 PM      Failed - Cr in normal range and within 360 days     Creatinine  Date Value Ref Range Status  12/10/2011 0.95 0.60 - 1.30 mg/dL Final   Creatinine, Ser  Date Value Ref Range Status  11/05/2022 1.21 (H) 0.57 - 1.00 mg/dL Final         Passed - Completed PHQ-2 or PHQ-9 in the last 360 days      Passed - Valid encounter within last 12 months    Recent Outpatient Visits           4 months ago Annual physical exam   [place] [facility] at [facility], Nyoka Cowden, MD   6 months ago Community acquired pneumonia of left lower lobe of lung   [facility] Care [facility] Medicine at [facility], Nyoka Cowden, MD   7 months ago Community acquired pneumonia of left lower lobe of lung   [facility] Primary Care & Sports Medicine at [facility], Nyoka Cowden, MD   9 months ago Diarrhea of presumed infectious origin   [facility] Primary Care & Sports Medicine at [facility], Nyoka Cowden, MD  1 year ago Essential hypertension   [place] [facility] Sports Medicine at [facility], Nyoka Cowden, MD       Future Appointments             In 8 months Judithann Graves, Nyoka Cowden, MD [facility] Primary Care & Sports Medicine at [facility], [facility]

## 2023-05-21 ENCOUNTER — Ambulatory Visit: Payer: Self-pay | Admitting: Internal Medicine

## 2023-05-26 ENCOUNTER — Encounter: Payer: Self-pay | Admitting: Internal Medicine

## 2023-05-26 ENCOUNTER — Ambulatory Visit (INDEPENDENT_AMBULATORY_CARE_PROVIDER_SITE_OTHER): Payer: HMO | Admitting: Internal Medicine

## 2023-05-26 VITALS — BP 128/64 | HR 76 | Ht 62.0 in | Wt 135.0 lb

## 2023-05-26 DIAGNOSIS — E538 Deficiency of other specified B group vitamins: Secondary | ICD-10-CM

## 2023-05-26 DIAGNOSIS — N1832 Chronic kidney disease, stage 3b: Secondary | ICD-10-CM

## 2023-05-26 DIAGNOSIS — F325 Major depressive disorder, single episode, in full remission: Secondary | ICD-10-CM

## 2023-05-26 DIAGNOSIS — I10 Essential (primary) hypertension: Secondary | ICD-10-CM | POA: Diagnosis not present

## 2023-05-26 DIAGNOSIS — N183 Chronic kidney disease, stage 3 unspecified: Secondary | ICD-10-CM | POA: Diagnosis not present

## 2023-05-26 DIAGNOSIS — D631 Anemia in chronic kidney disease: Secondary | ICD-10-CM

## 2023-05-26 MED ORDER — BISOPROLOL FUMARATE 5 MG PO TABS: 5.0000 mg | ORAL_TABLET | Freq: Two times a day (BID) | ORAL | 1 refills | Status: DC

## 2023-05-26 MED ORDER — BUPROPION HCL ER (SR) 150 MG PO TB12
300.0000 mg | ORAL_TABLET | Freq: Every day | ORAL | 1 refills | Status: DC
Start: 2023-05-26 — End: 2023-11-18

## 2023-05-26 MED ORDER — TRIAMTERENE-HCTZ 37.5-25 MG PO TABS
1.0000 | ORAL_TABLET | Freq: Every day | ORAL | 1 refills | Status: DC
Start: 2023-05-26 — End: 2023-11-18

## 2023-05-26 MED ORDER — AMITRIPTYLINE HCL 50 MG PO TABS: 50.0000 mg | ORAL_TABLET | Freq: Every day | ORAL | 1 refills | Status: DC

## 2023-05-26 NOTE — Patient Instructions (Signed)
 Call [facility] to schedule your mammogram at [phone].

## 2023-05-26 NOTE — Assessment & Plan Note (Signed)
 Mild anemia noted - could be B12 def vs CKD vs Fe def Will get labs to further evaluate

## 2023-05-26 NOTE — Assessment & Plan Note (Signed)
 Continue adequate hydration and regular monitoring If worsening, would encourage Nephrology consult

## 2023-05-26 NOTE — Progress Notes (Signed)
 Date:  05/26/2023   Name:  Jodi Bartlett   DOB:  1936/02/03   MRN:  [id number]   Chief Complaint: Hypertension and Chronic Kidney Disease  Hypertension This is a chronic problem. The problem is controlled. Associated symptoms include shortness of breath (with exertion). Pertinent negatives include no chest pain, headaches or palpitations. Hypertensive end-organ damage includes kidney disease.  CKD - no new complaints, no urinary symptoms, edema.  She does have some fatigue and shortness of breath with extreme exertion.  Despite this she maintains her house and keeps her 45 yr old granddaughter 2 days per week.  Review of Systems  Constitutional:  Positive for fatigue. Negative for appetite change and unexpected weight change.  HENT:  Negative for trouble swallowing.   Eyes:  Negative for visual disturbance.  Respiratory:  Positive for shortness of breath (with exertion). Negative for cough, chest tightness and wheezing.   Cardiovascular:  Negative for chest pain, palpitations and leg swelling.  Gastrointestinal:  Negative for abdominal pain, constipation and diarrhea.  Genitourinary:  Negative for difficulty urinating.  Neurological:  Negative for dizziness, weakness, light-headedness and headaches.  Psychiatric/Behavioral:  Negative for dysphoric mood and sleep disturbance. The patient is not nervous/anxious.      Lab Results  Component Value Date   NA 141 11/05/2022   K 4.5 11/05/2022   CO2 25 11/05/2022   GLUCOSE 89 11/05/2022   BUN 18 11/05/2022   CREATININE 1.21 (H) 11/05/2022   CALCIUM 9.6 11/05/2022   EGFR 44 (L) 11/05/2022   GFRNONAA 20 (L) 08/07/2022   Lab Results  Component Value Date   CHOL 159 11/05/2022   HDL 48 11/05/2022   LDLCALC 91 11/05/2022   TRIG 108 11/05/2022   CHOLHDL 3.3 11/05/2022   Lab Results  Component Value Date   TSH 3.800 11/05/2022   No results found for: "HGBA1C" Lab Results  Component Value Date   WBC 6.4 11/05/2022   HGB 10.9  (L) 11/05/2022   HCT 32.8 (L) 11/05/2022   MCV 96 11/05/2022   PLT 265 11/05/2022   Lab Results  Component Value Date   ALT 11 11/05/2022   AST 14 11/05/2022   ALKPHOS 65 11/05/2022   BILITOT 0.3 11/05/2022   Lab Results  Component Value Date   VD25OH 59.3 11/05/2022     Patient Active Problem List   Diagnosis Date Noted   Osteoporosis 06/20/2020   CKD stage 3b, GFR 30-44 ml/min (HCC) 01/12/2020   Migraine without aura and without status migrainosus, not intractable 02/03/2019   Inguinal hernia bilateral, non-recurrent 05/25/2018   Hiatal hernia 05/21/2018   Aortic atherosclerosis (HCC) 05/21/2018   Gastroesophageal reflux disease without esophagitis 12/31/2017   Degenerative lumbar spinal stenosis 01/01/2016   Primary osteoarthritis of both hips 01/01/2016   Insomnia 07/17/2012   Bronchiectasis (HCC) 03/02/2012   Anemia of chronic kidney failure, stage 3 (moderate) (HCC) 11/13/2011   Vitamin D deficiency 10/11/2008   B12 deficiency 10/04/2008   IBS 10/01/2007   Major depression, single episode, in complete remission (HCC) 07/31/2007   Essential hypertension 07/31/2007   Chronic back pain 07/31/2007   History of colonic polyps 07/31/2007    No Known Allergies  Past Surgical History:  Procedure Laterality Date   ABDOMINAL HYSTERECTOMY  1962   ovarian cancer   APPENDECTOMY     breast biopsy- benign     BREAST CYST EXCISION Left    bunion/morton's neuroma  4/07   right foot    carpal tunnel  release-right     CHOLECYSTECTOMY  1994   colitis transfusion     hosp at 25   COLONOSCOPY     very difficul- supp with BE past   EGD tight ues sphincter  7/09   with dialtion   FOOT SURGERY  10/09   TMT fusion and bunionectomy   HEMORRHOID SURGERY     INCISIONAL HERNIA REPAIR     after ccy   left knee surgery     left shoulder replacement     stress cardiolite  9/10   VIDEO BRONCHOSCOPY  04/22/2012   Procedure: VIDEO BRONCHOSCOPY WITH FLUORO;  Surgeon: Lupita Leash, MD;  Location: [facility];  Service: Cardiopulmonary;  Laterality: Bilateral;    Social History   Tobacco Use   Smoking status: Never   Smokeless tobacco: Never   Tobacco comments:    smoking cessation materials not required  Vaping Use   Vaping status: Never Used  Substance Use Topics   Alcohol use: Yes    Comment: social wine    Drug use: No     Medication list has been reviewed and updated.  Current Meds  Medication Sig   acetaminophen (TYLENOL) 500 MG tablet Take 500 mg by mouth every 6 (six) hours as needed.   ALPRAZolam (XANAX) 0.25 MG tablet Take 1 tablet (0.25 mg total) by mouth at bedtime as needed for anxiety.   Cholecalciferol (VITAMIN D3) 2000 units TABS Take by mouth.   gabapentin (NEURONTIN) 100 MG capsule Take 2 capsules (200 mg total) by mouth at bedtime.   pantoprazole (PROTONIX) 40 MG tablet TAKE 1 TABLET BY MOUTH TWICE A DAY   vitamin B-12 (CYANOCOBALAMIN) 1000 MCG tablet Take 1,000 mcg by mouth daily.   [DISCONTINUED] amitriptyline (ELAVIL) 50 MG tablet Take 1 tablet (50 mg total) by mouth at bedtime.   [DISCONTINUED] bisoprolol (ZEBETA) 5 MG tablet TAKE 1 TABLET (5 MG TOTAL) BY MOUTH IN THE MORNING AND AT BEDTIME   [DISCONTINUED] buPROPion (WELLBUTRIN SR) 150 MG 12 hr tablet TAKE 2 TABLETS BY MOUTH DAILY.   [DISCONTINUED] triamterene-hydrochlorothiazide (MAXZIDE-25) 37.5-25 MG tablet Take 1 tablet by mouth daily.       05/26/2023    8:31 AM 11/05/2022   10:33 AM 08/06/2022    9:04 AM 05/27/2022    1:40 PM  GAD 7 : Generalized Anxiety Score  Nervous, Anxious, on Edge 0 0 0 0  Control/stop worrying 0 0 0 0  Worry too much - different things 0 0 0 0  Trouble relaxing 0 0 0 0  Restless 0 0 0 0  Easily annoyed or irritable 0 0 0 0  Afraid - awful might happen 0 0 0 0  Total GAD 7 Score 0 0 0 0  Anxiety Difficulty Not difficult at all Not difficult at all Not difficult at all Not difficult at all       05/26/2023    8:31 AM 11/05/2022   10:33  AM 10/23/2022   10:20 AM  Depression screen PHQ 2/9  Decreased Interest 0 0 0  Down, Depressed, Hopeless 0 0 0  PHQ - 2 Score 0 0 0  Altered sleeping 0 0   Tired, decreased energy 0 0   Change in appetite 0 0   Feeling bad or failure about yourself  0 0   Trouble concentrating 0 0   Moving slowly or fidgety/restless 0 0   Suicidal thoughts 0 0   PHQ-9 Score 0 0   Difficult doing  work/chores Not difficult at all Not difficult at all     BP Readings from Last 3 Encounters:  05/26/23 128/64  11/05/22 112/62  08/12/22 110/78    Physical Exam Vitals and nursing note reviewed.  Constitutional:      General: She is not in acute distress.    Appearance: Normal appearance. She is well-developed.  HENT:     Head: Normocephalic and atraumatic.  Neck:     Vascular: No carotid bruit.  Cardiovascular:     Rate and Rhythm: Normal rate and regular rhythm.     Heart sounds: No murmur heard. Pulmonary:     Effort: Pulmonary effort is normal. No respiratory distress.     Breath sounds: No wheezing or rhonchi.  Musculoskeletal:     Cervical back: Normal range of motion.     Right lower leg: No edema.     Left lower leg: No edema.  Lymphadenopathy:     Cervical: No cervical adenopathy.  Skin:    General: Skin is warm and dry.     Findings: No rash.  Neurological:     General: No focal deficit present.     Mental Status: She is alert and oriented to person, place, and time.  Psychiatric:        Mood and Affect: Mood normal.        Behavior: Behavior normal.     Wt Readings from Last 3 Encounters:  05/26/23 135 lb (61.2 kg)  11/05/22 134 lb (60.8 kg)  10/23/22 130 lb (59 kg)    BP 128/64   Pulse 76   Ht 5\' 2"  (1.575 m)   Wt 135 lb (61.2 kg)   SpO2 97%   BMI 24.69 kg/m   Assessment and Plan:  Problem List Items Addressed This Visit       Unprioritized   B12 deficiency (Chronic)   Supplemented; will recheck today since complaints of fatigue      Relevant Orders    Vitamin B12   Major depression, single episode, in complete remission (HCC) (Chronic)   Relevant Medications   buPROPion (WELLBUTRIN SR) 150 MG 12 hr tablet   amitriptyline (ELAVIL) 50 MG tablet   Other Relevant Orders   TSH   Essential hypertension - Primary (Chronic)   Relevant Medications   triamterene-hydrochlorothiazide (MAXZIDE-25) 37.5-25 MG tablet   bisoprolol (ZEBETA) 5 MG tablet   Other Relevant Orders   Comprehensive metabolic panel   TSH   Anemia of chronic kidney failure, stage 3 (moderate) (HCC)   Mild anemia noted - could be B12 def vs CKD vs Fe def Will get labs to further evaluate      Relevant Orders   CBC with Differential/Platelet   Iron, TIBC and Ferritin Panel   CKD stage 3b, GFR 30-44 ml/min (HCC)   Continue adequate hydration and regular monitoring If worsening, would encourage Nephrology consult      Relevant Orders   Comprehensive metabolic panel   Parathyroid hormone, intact (no Ca)  She is reminded to schedule her Mammogram.  No follow-ups on file.    Reubin Milan, MD [facility] [place]

## 2023-05-26 NOTE — Assessment & Plan Note (Signed)
 Supplemented; will recheck today since complaints of fatigue

## 2023-05-27 LAB — CBC WITH DIFFERENTIAL/PLATELET
Basophils Absolute: 0 10*3/uL (ref 0.0–0.2)
Basos: 1 %
EOS (ABSOLUTE): 0.4 10*3/uL (ref 0.0–0.4)
Eos: 5 %
Hematocrit: 35.4 % (ref 34.0–46.6)
Hemoglobin: 11.8 g/dL (ref 11.1–15.9)
Immature Grans (Abs): 0 10*3/uL (ref 0.0–0.1)
Immature Granulocytes: 0 %
Lymphocytes Absolute: 1.8 10*3/uL (ref 0.7–3.1)
Lymphs: 23 %
MCH: 33 pg (ref 26.6–33.0)
MCHC: 33.3 g/dL (ref 31.5–35.7)
MCV: 99 fL — ABNORMAL HIGH (ref 79–97)
Monocytes Absolute: 0.5 10*3/uL (ref 0.1–0.9)
Monocytes: 6 %
Neutrophils Absolute: 5.3 10*3/uL (ref 1.4–7.0)
Neutrophils: 65 %
Platelets: 263 10*3/uL (ref 150–450)
RBC: 3.58 x10E6/uL — ABNORMAL LOW (ref 3.77–5.28)
RDW: 11.8 % (ref 11.7–15.4)
WBC: 8 10*3/uL (ref 3.4–10.8)

## 2023-05-27 LAB — IRON,TIBC AND FERRITIN PANEL
Ferritin: 27 ng/mL (ref 15–150)
Iron Saturation: 28 % (ref 15–55)
Iron: 110 ug/dL (ref 27–139)
Total Iron Binding Capacity: 391 ug/dL (ref 250–450)
UIBC: 281 ug/dL (ref 118–369)

## 2023-05-27 LAB — COMPREHENSIVE METABOLIC PANEL
ALT: 16 IU/L (ref 0–32)
AST: 19 IU/L (ref 0–40)
Albumin: 4.5 g/dL (ref 3.7–4.7)
Alkaline Phosphatase: 75 IU/L (ref 44–121)
BUN/Creatinine Ratio: 24 (ref 12–28)
BUN: 26 mg/dL (ref 8–27)
Bilirubin Total: 0.3 mg/dL (ref 0.0–1.2)
CO2: 25 mmol/L (ref 20–29)
Calcium: 9.9 mg/dL (ref 8.7–10.3)
Chloride: 105 mmol/L (ref 96–106)
Creatinine, Ser: 1.08 mg/dL — ABNORMAL HIGH (ref 0.57–1.00)
Globulin, Total: 2.4 g/dL (ref 1.5–4.5)
Glucose: 98 mg/dL (ref 70–99)
Potassium: 4.6 mmol/L (ref 3.5–5.2)
Sodium: 142 mmol/L (ref 134–144)
Total Protein: 6.9 g/dL (ref 6.0–8.5)
eGFR: 50 mL/min/{1.73_m2} — ABNORMAL LOW (ref >59)

## 2023-05-27 LAB — TSH: TSH: 7.01 u[IU]/mL — ABNORMAL HIGH (ref 0.450–4.500)

## 2023-05-27 LAB — VITAMIN B12: Vitamin B-12: >2000 pg/mL — ABNORMAL HIGH (ref 232–1245)

## 2023-05-27 LAB — PARATHYROID HORMONE, INTACT (NO CA): PTH: 27 pg/mL (ref 15–65)

## 2023-06-02 ENCOUNTER — Telehealth: Payer: Self-pay

## 2023-06-02 NOTE — Telephone Encounter (Signed)
 Patient informed of lab results. Waiting on thyroid results to come back and will call with those.   Copied from CRM [id number]. Topic: Clinical - Lab/Test Results >> Jun 02, 2023 11:00 AM Jodi Bartlett wrote: Reason for CRM: Pt states she was waiting for a call back from the nurse to go over her test results. Nobody has called her back yet. I read the doctors notes for her but she would like a nurse to go over the results further in detail. I let her know that a nurse would give her a call back.

## 2023-07-10 ENCOUNTER — Telehealth: Payer: Self-pay

## 2023-07-10 NOTE — Telephone Encounter (Signed)
 Copied from CRM [id number]. Topic: Clinical - Lab/Test Results >> Jul 10, 2023 11:47 AM Izetta Dakin wrote: Reason for CRM: Patient calling to check the status of her 2nd Thyroid lab results.  Requesting a callback.  Callback [phone]

## 2023-07-11 ENCOUNTER — Other Ambulatory Visit: Payer: Self-pay | Admitting: Internal Medicine

## 2023-07-11 DIAGNOSIS — R7989 Other specified abnormal findings of blood chemistry: Secondary | ICD-10-CM

## 2023-07-11 NOTE — Telephone Encounter (Signed)
 Spoke with [facility], they do not have any other testing done for Jodi Bartlett after 05/26/2023.  The only TSH that they can pull up is the one that was complete on 05/26/2023.

## 2023-07-11 NOTE — Progress Notes (Unsigned)
 Date:  07/11/2023   Name:  Jodi Bartlett   DOB:  [birth year]   MRN:  [id number]   Chief Complaint: No chief complaint on file.  HPI  Review of Systems   Lab Results  Component Value Date   NA 142 05/26/2023   K 4.6 05/26/2023   CO2 25 05/26/2023   GLUCOSE 98 05/26/2023   BUN 26 05/26/2023   CREATININE 1.08 (H) 05/26/2023   CALCIUM 9.9 05/26/2023   EGFR 50 (L) 05/26/2023   GFRNONAA 20 (L) 08/07/2022   Lab Results  Component Value Date   CHOL 159 11/05/2022   HDL 48 11/05/2022   LDLCALC 91 11/05/2022   TRIG 108 11/05/2022   CHOLHDL 3.3 11/05/2022   Lab Results  Component Value Date   TSH 7.010 (H) 05/26/2023   No results found for: "HGBA1C" Lab Results  Component Value Date   WBC 8.0 05/26/2023   HGB 11.8 05/26/2023   HCT 35.4 05/26/2023   MCV 99 (H) 05/26/2023   PLT 263 05/26/2023   Lab Results  Component Value Date   ALT 16 05/26/2023   AST 19 05/26/2023   ALKPHOS 75 05/26/2023   BILITOT 0.3 05/26/2023   Lab Results  Component Value Date   VD25OH 59.3 11/05/2022     Patient Active Problem List   Diagnosis Date Noted   Osteoporosis 06/20/2020   CKD stage 3b, GFR 30-44 ml/min (HCC) 01/12/2020   Migraine without aura and without status migrainosus, not intractable 02/03/2019   Inguinal hernia bilateral, non-recurrent 05/25/2018   Hiatal hernia 05/21/2018   Aortic atherosclerosis (HCC) 05/21/2018   Gastroesophageal reflux disease without esophagitis 12/31/2017   Degenerative lumbar spinal stenosis 01/01/2016   Primary osteoarthritis of both hips 01/01/2016   Insomnia 07/17/2012   Bronchiectasis (HCC) 03/02/2012   Anemia of chronic kidney failure, stage 3 (moderate) (HCC) 11/13/2011   Vitamin D deficiency 10/11/2008   B12 deficiency 10/04/2008   IBS 10/01/2007   Major depression, single episode, in complete remission (HCC) 07/31/2007   Essential hypertension 07/31/2007   Chronic back pain 07/31/2007   History of colonic polyps 07/31/2007     No Known Allergies  Past Surgical History:  Procedure Laterality Date   ABDOMINAL HYSTERECTOMY  1962   ovarian cancer   APPENDECTOMY     breast biopsy- benign     BREAST CYST EXCISION Left    bunion/morton's neuroma  4/07   right foot    carpal tunnel release-right     CHOLECYSTECTOMY  1994   colitis transfusion     hosp at 25   COLONOSCOPY     very difficul- supp with BE past   EGD tight ues sphincter  7/09   with dialtion   FOOT SURGERY  10/09   TMT fusion and bunionectomy   HEMORRHOID SURGERY     INCISIONAL HERNIA REPAIR     after ccy   left knee surgery     left shoulder replacement     stress cardiolite  9/10   VIDEO BRONCHOSCOPY  04/22/2012   Procedure: VIDEO BRONCHOSCOPY WITH FLUORO;  Surgeon: Lupita Leash, MD;  Location: [facility];  Service: Cardiopulmonary;  Laterality: Bilateral;    Social History   Tobacco Use   Smoking status: Never   Smokeless tobacco: Never   Tobacco comments:    smoking cessation materials not required  Vaping Use   Vaping status: Never Used  Substance Use Topics   Alcohol use: Yes    Comment: social  wine    Drug use: No     Medication list has been reviewed and updated.  No outpatient medications have been marked as taking for the 07/11/23 encounter (Orders Only) with Reubin Milan, MD.       05/26/2023    8:31 AM 11/05/2022   10:33 AM 08/06/2022    9:04 AM 05/27/2022    1:40 PM  GAD 7 : Generalized Anxiety Score  Nervous, Anxious, on Edge 0 0 0 0  Control/stop worrying 0 0 0 0  Worry too much - different things 0 0 0 0  Trouble relaxing 0 0 0 0  Restless 0 0 0 0  Easily annoyed or irritable 0 0 0 0  Afraid - awful might happen 0 0 0 0  Total GAD 7 Score 0 0 0 0  Anxiety Difficulty Not difficult at all Not difficult at all Not difficult at all Not difficult at all       05/26/2023    8:31 AM 11/05/2022   10:33 AM 10/23/2022   10:20 AM  Depression screen PHQ 2/9  Decreased Interest 0 0 0  Down,  Depressed, Hopeless 0 0 0  PHQ - 2 Score 0 0 0  Altered sleeping 0 0   Tired, decreased energy 0 0   Change in appetite 0 0   Feeling bad or failure about yourself  0 0   Trouble concentrating 0 0   Moving slowly or fidgety/restless 0 0   Suicidal thoughts 0 0   PHQ-9 Score 0 0   Difficult doing work/chores Not difficult at all Not difficult at all     BP Readings from Last 3 Encounters:  05/26/23 128/64  11/05/22 112/62  08/12/22 110/78    Physical Exam  Wt Readings from Last 3 Encounters:  05/26/23 135 lb (61.2 kg)  11/05/22 134 lb (60.8 kg)  10/23/22 130 lb (59 kg)    There were no vitals taken for this visit.  Assessment and Plan:  Problem List Items Addressed This Visit   None   No follow-ups on file.    Reubin Milan, MD [facility] [place]

## 2023-07-11 NOTE — Telephone Encounter (Signed)
 Spoke with patient, gave instructions per Dr.Berglund to obtain labs. Pt aware and verbalized understanding

## 2023-09-08 ENCOUNTER — Ambulatory Visit: Payer: Self-pay

## 2023-09-08 NOTE — Telephone Encounter (Signed)
 Reason for Disposition . SEVERE coughing spells (e.g., whooping sound after coughing, vomiting after coughing)  Answer Assessment - Initial Assessment Questions 1. ONSET: "When did the cough begin?"      Two weeks ago 2. SEVERITY: "How bad is the cough today?"      severe 3. SPUTUM: "Describe the color of your sputum" (none, dry cough; clear, white, yellow, green)     Thick yellow 4. HEMOPTYSIS: "Are you coughing up any blood?" If so ask: "How much?" (flecks, streaks, tablespoons, etc.)     no 5. DIFFICULTY BREATHING: "Are you having difficulty breathing?" If Yes, ask: "How bad is it?" (e.g., mild, moderate, severe)    - MILD: No SOB at rest, mild SOB with walking, speaks normally in sentences, can lie down, no retractions, pulse < 100.    - MODERATE: SOB at rest, SOB with minimal exertion and prefers to sit, cannot lie down flat, speaks in phrases, mild retractions, audible wheezing, pulse 100-120.    - SEVERE: Very SOB at rest, speaks in single words, struggling to breathe, sitting hunched forward, retractions, pulse > 120      denies 6. FEVER: "Do you have a fever?" If Yes, ask: "What is your temperature, how was it measured, and when did it start?"     denies 7. CARDIAC HISTORY: "Do you have any history of heart disease?" (e.g., heart attack, congestive heart failure)      no 8. LUNG HISTORY: "Do you have any history of lung disease?"  (e.g., pulmonary embolus, asthma, emphysema)     Copd 9. PE RISK FACTORS: "Do you have a history of blood clots?" (or: recent major surgery, recent prolonged travel, bedridden)     no 10. OTHER SYMPTOMS: "Do you have any other symptoms?" (e.g., runny nose, wheezing, chest pain)       Runny nose, wheezing 11. PREGNANCY: "Is there any chance you are pregnant?" "When was your last menstrual period?"       no 12. TRAVEL: "Have you traveled out of the country in the last month?" (e.g., travel history, exposures)       no  Protocols used: Cough - Acute  Productive-A-AH

## 2023-09-08 NOTE — Telephone Encounter (Addendum)
  FYI Only or Action Required?: FYI only for provider  Patient was last seen in primary care on 05/26/2023 by Sheron Dixons, MD. Called Nurse Triage reporting Cough. Symptoms began several weeks ago. Interventions attempted: Nothing. Symptoms are: gradually worsening.  Triage Disposition: See Physician Within 24 Hours, Call PCP Within 24 Hours  Patient/caregiver understands and will follow disposition?: Yes     Reason for Triage: Patient has been fighting a cold and cough for 2 weeks, history of lung problems. No pain, lots of mucus build up and would like someone to follow up and see what she can do to get over this cough.  First attempt; no answer.

## 2023-09-09 ENCOUNTER — Ambulatory Visit (INDEPENDENT_AMBULATORY_CARE_PROVIDER_SITE_OTHER): Admitting: Internal Medicine

## 2023-09-09 ENCOUNTER — Ambulatory Visit
Admission: RE | Admit: 2023-09-09 | Discharge: 2023-09-09 | Disposition: A | Discharge status: Home / Self Care | Source: Ambulatory Visit | Attending: Internal Medicine | Admitting: Internal Medicine

## 2023-09-09 ENCOUNTER — Ambulatory Visit: Payer: Self-pay | Admitting: Internal Medicine

## 2023-09-09 ENCOUNTER — Other Ambulatory Visit
Admission: RE | Admit: 2023-09-09 | Discharge: 2023-09-09 | Disposition: A | Discharge status: Home / Self Care | Source: Home / Self Care | Attending: Internal Medicine | Admitting: Internal Medicine

## 2023-09-09 ENCOUNTER — Encounter: Payer: Self-pay | Admitting: Internal Medicine

## 2023-09-09 ENCOUNTER — Ambulatory Visit
Admission: RE | Admit: 2023-09-09 | Discharge: 2023-09-09 | Disposition: A | Discharge status: Home / Self Care | Attending: Internal Medicine | Admitting: Internal Medicine

## 2023-09-09 VITALS — BP 116/72 | HR 70 | Temp 97.9°F | Ht 62.0 in | Wt 131.1 lb

## 2023-09-09 DIAGNOSIS — J0100 Acute maxillary sinusitis, unspecified: Secondary | ICD-10-CM | POA: Diagnosis not present

## 2023-09-09 DIAGNOSIS — I700 Atherosclerosis of aorta: Secondary | ICD-10-CM | POA: Diagnosis not present

## 2023-09-09 DIAGNOSIS — R0989 Other specified symptoms and signs involving the circulatory and respiratory systems: Secondary | ICD-10-CM | POA: Diagnosis not present

## 2023-09-09 DIAGNOSIS — Z96612 Presence of left artificial shoulder joint: Secondary | ICD-10-CM | POA: Diagnosis not present

## 2023-09-09 DIAGNOSIS — R059 Cough, unspecified: Secondary | ICD-10-CM | POA: Diagnosis not present

## 2023-09-09 LAB — CBC WITH DIFFERENTIAL/PLATELET
Abs Immature Granulocytes: 0.04 10*3/uL (ref 0.00–0.07)
Basophils Absolute: 0 10*3/uL (ref 0.0–0.1)
Basophils Relative: 1 %
Eosinophils Absolute: 0.2 10*3/uL (ref 0.0–0.5)
Eosinophils Relative: 2 %
HCT: 34.9 % — ABNORMAL LOW (ref 36.0–46.0)
Hemoglobin: 11.8 g/dL — ABNORMAL LOW (ref 12.0–15.0)
Immature Granulocytes: 1 %
Lymphocytes Relative: 20 %
Lymphs Abs: 1.6 10*3/uL (ref 0.7–4.0)
MCH: 33 pg (ref 26.0–34.0)
MCHC: 33.8 g/dL (ref 30.0–36.0)
MCV: 97.5 fL (ref 80.0–100.0)
Monocytes Absolute: 0.6 10*3/uL (ref 0.1–1.0)
Monocytes Relative: 8 %
Neutro Abs: 5.5 10*3/uL (ref 1.7–7.7)
Neutrophils Relative %: 68 %
Platelets: 363 10*3/uL (ref 150–400)
RBC: 3.58 MIL/uL — ABNORMAL LOW (ref 3.87–5.11)
RDW: 12.4 % (ref 11.5–15.5)
WBC: 8 10*3/uL (ref 4.0–10.5)
nRBC: 0 % (ref 0.0–0.2)

## 2023-09-09 MED ORDER — HYDROCODONE BIT-HOMATROP MBR 5-1.5 MG/5ML PO SOLN
5.0000 mL | Freq: Four times a day (QID) | ORAL | 0 refills | Status: AC | PRN
Start: 2023-09-09 — End: 2023-09-19

## 2023-09-09 MED ORDER — DOXYCYCLINE HYCLATE 100 MG PO TABS
100.0000 mg | ORAL_TABLET | Freq: Two times a day (BID) | ORAL | 0 refills | Status: AC
Start: 2023-09-09 — End: 2023-09-19

## 2023-09-09 NOTE — Progress Notes (Signed)
 Date:  09/09/2023   Name:  Jodi Bartlett   DOB:  February 16, 1936   MRN:  [id number]   Chief Complaint: Cough (Patient said she has had a cough/cold for 2 weeks, tired mucinex  for cold, no fevers, wheezing, thick yellow phlegm )  Cough This is a new problem. The current episode started 1 to 4 weeks ago. The problem has been unchanged. The problem occurs every few minutes. The cough is Productive of sputum. Associated symptoms include nasal congestion. Pertinent negatives include no chest pain, chills, fever, headaches, sore throat or shortness of breath.    Review of Systems  Constitutional:  Positive for fatigue. Negative for chills and fever.  HENT:  Positive for congestion and sinus pressure. Negative for sore throat.   Respiratory:  Positive for cough. Negative for shortness of breath.   Cardiovascular:  Negative for chest pain and palpitations.  Neurological:  Negative for dizziness and headaches.  Psychiatric/Behavioral:  Positive for sleep disturbance. Negative for dysphoric mood. The patient is not nervous/anxious.      Lab Results  Component Value Date   NA 142 05/26/2023   K 4.6 05/26/2023   CO2 25 05/26/2023   GLUCOSE 98 05/26/2023   BUN 26 05/26/2023   CREATININE 1.08 (H) 05/26/2023   CALCIUM 9.9 05/26/2023   EGFR 50 (L) 05/26/2023   GFRNONAA 20 (L) 08/07/2022   Lab Results  Component Value Date   CHOL 159 11/05/2022   HDL 48 11/05/2022   LDLCALC 91 11/05/2022   TRIG 108 11/05/2022   CHOLHDL 3.3 11/05/2022   Lab Results  Component Value Date   TSH 7.010 (H) 05/26/2023   No results found for: "HGBA1C" Lab Results  Component Value Date   WBC 8.0 05/26/2023   HGB 11.8 05/26/2023   HCT 35.4 05/26/2023   MCV 99 (H) 05/26/2023   PLT 263 05/26/2023   Lab Results  Component Value Date   ALT 16 05/26/2023   AST 19 05/26/2023   ALKPHOS 75 05/26/2023   BILITOT 0.3 05/26/2023   Lab Results  Component Value Date   VD25OH 59.3 11/05/2022     Patient Active  Problem List   Diagnosis Date Noted   Osteoporosis 06/20/2020   CKD stage 3b, GFR 30-44 ml/min (HCC) 01/12/2020   Migraine without aura and without status migrainosus, not intractable 02/03/2019   Inguinal hernia bilateral, non-recurrent 05/25/2018   Hiatal hernia 05/21/2018   Aortic atherosclerosis (HCC) 05/21/2018   Gastroesophageal reflux disease without esophagitis 12/31/2017   Degenerative lumbar spinal stenosis 01/01/2016   Primary osteoarthritis of both hips 01/01/2016   Insomnia 07/17/2012   Bronchiectasis (HCC) 03/02/2012   Anemia of chronic kidney failure, stage 3 (moderate) (HCC) 11/13/2011   Vitamin D  deficiency 10/11/2008   B12 deficiency 10/04/2008   IBS 10/01/2007   Major depression, single episode, in complete remission (HCC) 07/31/2007   Essential hypertension 07/31/2007   Chronic back pain 07/31/2007   History of colonic polyps 07/31/2007    No Known Allergies  Past Surgical History:  Procedure Laterality Date   ABDOMINAL HYSTERECTOMY  1962   ovarian cancer   APPENDECTOMY     breast biopsy- benign     BREAST CYST EXCISION Left    bunion/morton's neuroma  4/07   right foot    carpal tunnel release-right     CHOLECYSTECTOMY  1994   colitis transfusion     hosp at 25   COLONOSCOPY     very difficul- supp with BE past  EGD tight ues sphincter  7/09   with dialtion   FOOT SURGERY  10/09   TMT fusion and bunionectomy   HEMORRHOID SURGERY     INCISIONAL HERNIA REPAIR     after ccy   left knee surgery     left shoulder replacement     stress cardiolite  9/10   VIDEO BRONCHOSCOPY  04/22/2012   Procedure: VIDEO BRONCHOSCOPY WITH FLUORO;  Surgeon: Marine Sia, MD;  Location: [facility];  Service: Cardiopulmonary;  Laterality: Bilateral;    Social History   Tobacco Use   Smoking status: Never   Smokeless tobacco: Never   Tobacco comments:    smoking cessation materials not required  Vaping Use   Vaping status: Never Used  Substance Use  Topics   Alcohol use: Yes    Comment: social wine    Drug use: No     Medication list has been reviewed and updated.  Current Meds  Medication Sig   acetaminophen  (TYLENOL ) 500 MG tablet Take 500 mg by mouth every 6 (six) hours as needed.   ALPRAZolam  (XANAX ) 0.25 MG tablet Take 1 tablet (0.25 mg total) by mouth at bedtime as needed for anxiety.   amitriptyline  (ELAVIL ) 50 MG tablet Take 1 tablet (50 mg total) by mouth at bedtime.   bisoprolol  (ZEBETA ) 5 MG tablet Take 1 tablet (5 mg total) by mouth in the morning and at bedtime.   buPROPion  (WELLBUTRIN  SR) 150 MG 12 hr tablet Take 2 tablets (300 mg total) by mouth daily.   Cholecalciferol  (VITAMIN D3) 2000 units TABS Take by mouth.   doxycycline  (VIBRA -TABS) 100 MG tablet Take 1 tablet (100 mg total) by mouth 2 (two) times daily for 10 days.   gabapentin  (NEURONTIN ) 100 MG capsule Take 2 capsules (200 mg total) by mouth at bedtime.   pantoprazole  (PROTONIX ) 40 MG tablet TAKE 1 TABLET BY MOUTH TWICE A DAY   triamterene -hydrochlorothiazide (MAXZIDE-25) 37.5-25 MG tablet Take 1 tablet by mouth daily.   vitamin B-12 (CYANOCOBALAMIN ) 1000 MCG tablet Take 1,000 mcg by mouth daily.       05/26/2023    8:31 AM 11/05/2022   10:33 AM 08/06/2022    9:04 AM 05/27/2022    1:40 PM  GAD 7 : Generalized Anxiety Score  Nervous, Anxious, on Edge 0 0 0 0  Control/stop worrying 0 0 0 0  Worry too much - different things 0 0 0 0  Trouble relaxing 0 0 0 0  Restless 0 0 0 0  Easily annoyed or irritable 0 0 0 0  Afraid - awful might happen 0 0 0 0  Total GAD 7 Score 0 0 0 0  Anxiety Difficulty Not difficult at all Not difficult at all Not difficult at all Not difficult at all       05/26/2023    8:31 AM 11/05/2022   10:33 AM 10/23/2022   10:20 AM  Depression screen PHQ 2/9  Decreased Interest 0 0 0  Down, Depressed, Hopeless 0 0 0  PHQ - 2 Score 0 0 0  Altered sleeping 0 0   Tired, decreased energy 0 0   Change in appetite 0 0   Feeling bad or  failure about yourself  0 0   Trouble concentrating 0 0   Moving slowly or fidgety/restless 0 0   Suicidal thoughts 0 0   PHQ-9 Score 0 0   Difficult doing work/chores Not difficult at all Not difficult at all  BP Readings from Last 3 Encounters:  09/09/23 116/72  05/26/23 128/64  11/05/22 112/62    Physical Exam Vitals and nursing note reviewed.  Constitutional:      General: She is not in acute distress.    Appearance: She is well-developed. She is not ill-appearing.  HENT:     Head: Normocephalic and atraumatic.     Right Ear: Tympanic membrane normal.     Left Ear: Tympanic membrane normal.     Nose:     Right Sinus: Maxillary sinus tenderness present. No frontal sinus tenderness.     Left Sinus: Maxillary sinus tenderness present. No frontal sinus tenderness.     Mouth/Throat:     Pharynx: Posterior oropharyngeal erythema present.  Cardiovascular:     Rate and Rhythm: Normal rate and regular rhythm.     Heart sounds: Normal heart sounds.  Pulmonary:     Effort: Pulmonary effort is normal. No respiratory distress.     Breath sounds: Decreased air movement present. Examination of the left-upper field reveals rhonchi. Examination of the left-middle field reveals rhonchi. Rhonchi present. No decreased breath sounds or wheezing.  Musculoskeletal:     Cervical back: Normal range of motion.  Lymphadenopathy:     Cervical: No cervical adenopathy.  Skin:    General: Skin is warm and dry.     Findings: No rash.  Neurological:     Mental Status: She is alert and oriented to person, place, and time.  Psychiatric:        Mood and Affect: Mood normal.        Behavior: Behavior normal.     Wt Readings from Last 3 Encounters:  09/09/23 131 lb 2 oz (59.5 kg)  05/26/23 135 lb (61.2 kg)  11/05/22 134 lb (60.8 kg)    BP 116/72   Pulse 70   Temp 97.9 F (36.6 C)   Ht 5\' 2"  (1.575 m)   Wt 131 lb 2 oz (59.5 kg)   SpO2 94%   BMI 23.98 kg/m   Assessment and  Plan:  Problem List Items Addressed This Visit   None Visit Diagnoses       Acute non-recurrent maxillary sinusitis    -  Primary   treat with Doxycycline    Relevant Medications   doxycycline  (VIBRA -TABS) 100 MG tablet   HYDROcodone  bit-homatropine (HYCODAN) 5-1.5 MG/5ML syrup     Abnormal lung sounds       concern for recurrent pneumonia will get CXR and CBC cough syrup; push fluids, continue Mucinex    Relevant Medications   HYDROcodone  bit-homatropine (HYCODAN) 5-1.5 MG/5ML syrup   Other Relevant Orders   CBC with Differential/Platelet   DG Chest 2 View       No follow-ups on file.    Sheron Dixons, MD [facility] [place]

## 2023-09-22 ENCOUNTER — Telehealth: Payer: Self-pay

## 2023-09-22 NOTE — Telephone Encounter (Signed)
 Copied from CRM [id number]. Topic: General - Other >> Sep 22, 2023 10:32 AM Jodi Bartlett wrote: Reason for CRM: Patient called in today to let Dr. Justus know that after the 10 day round of antibiotics (finished Friday) she is still coughing, she finished them on Friday. Please advise of next steps, phone call is preferred # [phone]

## 2023-09-22 NOTE — Telephone Encounter (Signed)
 Spoke with patient and informed her of above message. She verbalized understanding but told me you told her to call if she finshed ABX and was still coughing . That is the only reason she called to let you know she was still coughing.  JM

## 2023-11-07 ENCOUNTER — Encounter: Payer: Self-pay | Admitting: Internal Medicine

## 2023-11-18 ENCOUNTER — Other Ambulatory Visit: Payer: Self-pay | Admitting: Internal Medicine

## 2023-11-18 ENCOUNTER — Encounter: Payer: Self-pay | Admitting: Internal Medicine

## 2023-11-18 ENCOUNTER — Ambulatory Visit (INDEPENDENT_AMBULATORY_CARE_PROVIDER_SITE_OTHER): Admitting: Internal Medicine

## 2023-11-18 VITALS — BP 122/78 | HR 72 | Ht 62.0 in | Wt 131.6 lb

## 2023-11-18 DIAGNOSIS — F325 Major depressive disorder, single episode, in full remission: Secondary | ICD-10-CM

## 2023-11-18 DIAGNOSIS — F5101 Primary insomnia: Secondary | ICD-10-CM

## 2023-11-18 DIAGNOSIS — G2581 Restless legs syndrome: Secondary | ICD-10-CM | POA: Insufficient documentation

## 2023-11-18 DIAGNOSIS — N1832 Chronic kidney disease, stage 3b: Secondary | ICD-10-CM

## 2023-11-18 DIAGNOSIS — Z1231 Encounter for screening mammogram for malignant neoplasm of breast: Secondary | ICD-10-CM | POA: Diagnosis not present

## 2023-11-18 DIAGNOSIS — Z0000 Encounter for general adult medical examination without abnormal findings: Secondary | ICD-10-CM | POA: Diagnosis not present

## 2023-11-18 DIAGNOSIS — K219 Gastro-esophageal reflux disease without esophagitis: Secondary | ICD-10-CM

## 2023-11-18 DIAGNOSIS — I700 Atherosclerosis of aorta: Secondary | ICD-10-CM | POA: Diagnosis not present

## 2023-11-18 DIAGNOSIS — K4090 Unilateral inguinal hernia, without obstruction or gangrene, not specified as recurrent: Secondary | ICD-10-CM

## 2023-11-18 DIAGNOSIS — I10 Essential (primary) hypertension: Secondary | ICD-10-CM

## 2023-11-18 DIAGNOSIS — R7989 Other specified abnormal findings of blood chemistry: Secondary | ICD-10-CM | POA: Diagnosis not present

## 2023-11-18 DIAGNOSIS — J479 Bronchiectasis, uncomplicated: Secondary | ICD-10-CM

## 2023-11-18 MED ORDER — BUPROPION HCL ER (SR) 150 MG PO TB12
300.0000 mg | ORAL_TABLET | Freq: Every day | ORAL | 1 refills | Status: AC
Start: 2023-11-18 — End: ?

## 2023-11-18 MED ORDER — TRIAMTERENE-HCTZ 37.5-25 MG PO TABS
1.0000 | ORAL_TABLET | Freq: Every day | ORAL | 1 refills | Status: AC
Start: 2023-11-18 — End: ?

## 2023-11-18 MED ORDER — BISOPROLOL FUMARATE 5 MG PO TABS
5.0000 mg | ORAL_TABLET | Freq: Two times a day (BID) | ORAL | 1 refills | Status: AC
Start: 2023-11-18 — End: ?

## 2023-11-18 MED ORDER — BUDESONIDE-FORMOTEROL FUMARATE 160-4.5 MCG/ACT IN AERO
2.0000 | INHALATION_SPRAY | Freq: Two times a day (BID) | RESPIRATORY_TRACT | 3 refills | Status: DC
Start: 2023-11-18 — End: 2023-11-21

## 2023-11-18 MED ORDER — GABAPENTIN 100 MG PO CAPS
200.0000 mg | ORAL_CAPSULE | Freq: Every day | ORAL | 0 refills | Status: AC
Start: 2023-11-18 — End: ?

## 2023-11-18 MED ORDER — BELSOMRA 10 MG PO TABS
10.0000 mg | ORAL_TABLET | Freq: Every evening | ORAL | 0 refills | Status: AC | PRN
Start: 2023-11-18 — End: ?

## 2023-11-18 MED ORDER — PANTOPRAZOLE SODIUM 40 MG PO TBEC: 40.0000 mg | DELAYED_RELEASE_TABLET | Freq: Two times a day (BID) | ORAL | 1 refills | Status: AC

## 2023-11-18 NOTE — Assessment & Plan Note (Signed)
 Continue to avoid nephrotoxic agents. Will monitor labs every 6 months.

## 2023-11-18 NOTE — Patient Instructions (Signed)
 Call [facility] to schedule your mammogram at [phone].

## 2023-11-18 NOTE — Assessment & Plan Note (Signed)
 Lipids managed with diet only. Lab Results  Component Value Date   LDLCALC 91 11/05/2022

## 2023-11-18 NOTE — Assessment & Plan Note (Addendum)
 Reflux symptoms are fairly well controlled on pantoprazole  bid. She is having frequent episodes of choking/dysphagia at least once per week. I recommend seeing GI again.

## 2023-11-18 NOTE — Progress Notes (Signed)
 Date:  11/18/2023   Name:  Jodi Bartlett   DOB:  [birth year]   MRN:  [id number]   Chief Complaint: Annual Exam (Patient c/o of insomnia.) Jodi Bartlett is a 88 y.o. female who presents today for her Complete Annual Exam. She feels fairly well. She reports exercising- some. She reports she is sleeping poorly. Breast complaints - none.  Health Maintenance  Topic Date Due   Mammogram  06/20/2021   Medicare Annual Wellness Visit  10/23/2023   Zoster (Shingles) Vaccine (1 of 2) 02/18/2024*   Flu Shot  06/29/2024*   COVID-19 Vaccine (4 - 2024-25 season) 09/24/2024*   Pneumococcal Vaccine for age over 40  Completed   DEXA scan (bone density measurement)  Completed   HPV Vaccine  Aged Out   Meningitis B Vaccine  Aged Out   DTaP/Tdap/Td vaccine  Discontinued  *Topic was postponed. The date shown is not the original due date.    Hypertension This is a chronic problem. The problem is controlled. Pertinent negatives include no chest pain, headaches, palpitations or shortness of breath. Past treatments include beta blockers and diuretics. The current treatment provides significant improvement.  Gastroesophageal Reflux She complains of choking, coughing, dysphagia, heartburn and water brash. She reports no abdominal pain (in right groin with fullness), no chest pain, no sore throat or no wheezing. This is a recurrent problem. The problem occurs frequently. The problem has been gradually worsening. Pertinent negatives include no fatigue. She has tried a PPI for the symptoms. The treatment provided mild relief.  Depression        This is a chronic problem.The problem is unchanged.  Associated symptoms include insomnia and myalgias (RLS symptoms).  Associated symptoms include no fatigue and no headaches.  Past treatments include TCAs - Tricyclic antidepressants and other medications. Back Pain This is a chronic problem. The problem is unchanged. The pain is present in the lumbar spine. Pertinent  negatives include no abdominal pain (in right groin with fullness), chest pain, headaches or weakness.  Insomnia Primary symptoms: sleep disturbance.   The problem occurs nightly. PMH includes: depression.     Review of Systems  Constitutional:  Negative for fatigue and unexpected weight change.  HENT:  Negative for sore throat and trouble swallowing.   Eyes:  Negative for visual disturbance.  Respiratory:  Positive for cough and choking. Negative for chest tightness, shortness of breath and wheezing.   Cardiovascular:  Negative for chest pain, palpitations and leg swelling.  Gastrointestinal:  Positive for dysphagia and heartburn. Negative for abdominal pain (in right groin with fullness), constipation and diarrhea.  Genitourinary:  Negative for frequency and urgency.  Musculoskeletal:  Positive for back pain and myalgias (RLS symptoms). Negative for arthralgias.  Neurological:  Negative for dizziness, weakness, light-headedness and headaches.  Psychiatric/Behavioral:  Positive for depression and sleep disturbance. Negative for dysphoric mood. The patient has insomnia. The patient is not nervous/anxious.      Lab Results  Component Value Date   NA 142 05/26/2023   K 4.6 05/26/2023   CO2 25 05/26/2023   GLUCOSE 98 05/26/2023   BUN 26 05/26/2023   CREATININE 1.08 (H) 05/26/2023   CALCIUM 9.9 05/26/2023   EGFR 50 (L) 05/26/2023   GFRNONAA 20 (L) 08/07/2022   Lab Results  Component Value Date   CHOL 159 11/05/2022   HDL 48 11/05/2022   LDLCALC 91 11/05/2022   TRIG 108 11/05/2022   CHOLHDL 3.3 11/05/2022   Lab Results  Component Value  Date   TSH 7.010 (H) 05/26/2023   No results found for: HGBA1C Lab Results  Component Value Date   WBC 8.0 09/09/2023   HGB 11.8 (L) 09/09/2023   HCT 34.9 (L) 09/09/2023   MCV 97.5 09/09/2023   PLT 363 09/09/2023   Lab Results  Component Value Date   ALT 16 05/26/2023   AST 19 05/26/2023   ALKPHOS 75 05/26/2023   BILITOT 0.3  05/26/2023   Lab Results  Component Value Date   VD25OH 59.3 11/05/2022     Patient Active Problem List   Diagnosis Date Noted   Restless leg syndrome 11/18/2023   Osteoporosis 06/20/2020   CKD stage 3b, GFR 30-44 ml/min (HCC) 01/12/2020   Migraine without aura and without status migrainosus, not intractable 02/03/2019   Hiatal hernia 05/21/2018   Aortic atherosclerosis (HCC) 05/21/2018   Gastroesophageal reflux disease without esophagitis 12/31/2017   Degenerative lumbar spinal stenosis 01/01/2016   Primary osteoarthritis of both hips 01/01/2016   Insomnia 07/17/2012   Bronchiectasis (HCC) 03/02/2012   Anemia of chronic kidney failure, stage 3 (moderate) (HCC) 11/13/2011   Vitamin D  deficiency 10/11/2008   B12 deficiency 10/04/2008   IBS 10/01/2007   Major depression, single episode, in complete remission (HCC) 07/31/2007   Essential hypertension 07/31/2007   Chronic back pain 07/31/2007   History of colonic polyps 07/31/2007    No Known Allergies  Past Surgical History:  Procedure Laterality Date   ABDOMINAL HYSTERECTOMY  1962   ovarian cancer   APPENDECTOMY     breast biopsy- benign     BREAST CYST EXCISION Left    bunion/morton's neuroma  06/2005   right foot    carpal tunnel release-right     CHOLECYSTECTOMY  1994   colitis transfusion     hosp at 25   COLONOSCOPY     very difficul- supp with BE past   EGD tight ues sphincter  09/2007   with dialtion   FOOT SURGERY  12/2007   TMT fusion and bunionectomy   HEMORRHOID SURGERY     INCISIONAL HERNIA REPAIR  1995   after ccy   left knee surgery     left shoulder replacement     stress cardiolite  11/2008   VIDEO BRONCHOSCOPY  04/22/2012   Procedure: VIDEO BRONCHOSCOPY WITH FLUORO;  Surgeon: Vicenta KATHEE Lennert, MD;  Location: [facility];  Service: Cardiopulmonary;  Laterality: Bilateral;    Social History   Tobacco Use   Smoking status: Never   Smokeless tobacco: Never   Tobacco comments:    smoking  cessation materials not required  Vaping Use   Vaping status: Never Used  Substance Use Topics   Alcohol use: Yes    Comment: social wine    Drug use: No     Medication list has been reviewed and updated.  Current Meds  Medication Sig   acetaminophen  (TYLENOL ) 500 MG tablet Take 500 mg by mouth every 6 (six) hours as needed.   ALPRAZolam  (XANAX ) 0.25 MG tablet Take 1 tablet (0.25 mg total) by mouth at bedtime as needed for anxiety.   amitriptyline  (ELAVIL ) 50 MG tablet Take 1 tablet (50 mg total) by mouth at bedtime.   budesonide -formoterol  (SYMBICORT ) 160-4.5 MCG/ACT inhaler Inhale 2 puffs into the lungs 2 (two) times daily.   Cholecalciferol  (VITAMIN D3) 2000 units TABS Take by mouth.   Suvorexant  (BELSOMRA ) 10 MG TABS Take 1 tablet (10 mg total) by mouth at bedtime as needed.   vitamin B-12 (CYANOCOBALAMIN )  1000 MCG tablet Take 1,000 mcg by mouth daily.   [DISCONTINUED] bisoprolol  (ZEBETA ) 5 MG tablet Take 1 tablet (5 mg total) by mouth in the morning and at bedtime.   [DISCONTINUED] buPROPion  (WELLBUTRIN  SR) 150 MG 12 hr tablet Take 2 tablets (300 mg total) by mouth daily.   [DISCONTINUED] gabapentin  (NEURONTIN ) 100 MG capsule Take 2 capsules (200 mg total) by mouth at bedtime.   [DISCONTINUED] pantoprazole  (PROTONIX ) 40 MG tablet TAKE 1 TABLET BY MOUTH TWICE A DAY   [DISCONTINUED] triamterene -hydrochlorothiazide (MAXZIDE-25) 37.5-25 MG tablet Take 1 tablet by mouth daily.       11/18/2023    1:06 PM 09/09/2023    9:47 AM 05/26/2023    8:31 AM 11/05/2022   10:33 AM  GAD 7 : Generalized Anxiety Score  Nervous, Anxious, on Edge 0 0 0 0  Control/stop worrying 0 0 0 0  Worry too much - different things 0 0 0 0  Trouble relaxing 0 0 0 0  Restless 0 0 0 0  Easily annoyed or irritable 0 0 0 0  Afraid - awful might happen 0 0 0 0  Total GAD 7 Score 0 0 0 0  Anxiety Difficulty Not difficult at all Not difficult at all Not difficult at all Not difficult at all       11/18/2023     1:06 PM 09/09/2023    9:47 AM 05/26/2023    8:31 AM  Depression screen PHQ 2/9  Decreased Interest 0 0 0  Down, Depressed, Hopeless 0 0 0  PHQ - 2 Score 0 0 0  Altered sleeping 2 0 0  Tired, decreased energy 3 3 0  Change in appetite 3 0 0  Feeling bad or failure about yourself  0 0 0  Trouble concentrating 0 0 0  Moving slowly or fidgety/restless 0 0 0  Suicidal thoughts 0 0 0  PHQ-9 Score 8 3 0  Difficult doing work/chores Not difficult at all Not difficult at all Not difficult at all    BP Readings from Last 3 Encounters:  11/18/23 122/78  09/09/23 116/72  05/26/23 128/64    Physical Exam Vitals and nursing note reviewed.  Constitutional:      General: She is not in acute distress.    Appearance: She is well-developed.  HENT:     Head: Normocephalic and atraumatic.     Right Ear: Tympanic membrane and ear canal normal.     Left Ear: Tympanic membrane and ear canal normal.     Nose:     Right Sinus: No maxillary sinus tenderness.     Left Sinus: No maxillary sinus tenderness.  Eyes:     General: No scleral icterus.       Right eye: No discharge.        Left eye: No discharge.     Conjunctiva/sclera: Conjunctivae normal.  Neck:     Thyroid : No thyromegaly.     Vascular: No carotid bruit.  Cardiovascular:     Rate and Rhythm: Normal rate and regular rhythm.     Pulses: Normal pulses.     Heart sounds: Normal heart sounds.  Pulmonary:     Effort: Pulmonary effort is normal. No respiratory distress.     Breath sounds: Normal breath sounds. No decreased breath sounds or wheezing.  Abdominal:     General: Bowel sounds are normal.     Palpations: Abdomen is soft.     Tenderness: There is no abdominal tenderness.  Hernia: A hernia is present. Hernia is present in the right inguinal area.  Musculoskeletal:     Cervical back: Normal range of motion. No erythema.     Right lower leg: No edema.     Left lower leg: No edema.  Lymphadenopathy:     Cervical: No  cervical adenopathy.  Skin:    General: Skin is warm and dry.     Findings: No rash.  Neurological:     Mental Status: She is alert and oriented to person, place, and time.     Cranial Nerves: No cranial nerve deficit.     Sensory: No sensory deficit.     Deep Tendon Reflexes: Reflexes are normal and symmetric.  Psychiatric:        Attention and Perception: Attention normal.        Mood and Affect: Mood normal.     Wt Readings from Last 3 Encounters:  11/18/23 131 lb 9.6 oz (59.7 kg)  09/09/23 131 lb 2 oz (59.5 kg)  05/26/23 135 lb (61.2 kg)    BP 122/78   Pulse 72   Ht 5' 2 (1.575 m)   Wt 131 lb 9.6 oz (59.7 kg)   SpO2 99%   BMI 24.07 kg/m   Assessment and Plan:  Problem List Items Addressed This Visit       Unprioritized   Major depression, single episode, in complete remission (HCC) (Chronic)   Clinically stable on Bupropion  and Elavil .   No SI or HI on evaluation. Plan to continue same medications for now.       Relevant Medications   buPROPion  (WELLBUTRIN  SR) 150 MG 12 hr tablet   Essential hypertension (Chronic)   Blood pressure is well controlled on Zebeta  and hydrochlorothiazide. No medication side effects noted. Plan to continue current medications.       Relevant Medications   bisoprolol  (ZEBETA ) 5 MG tablet   triamterene -hydrochlorothiazide (MAXZIDE-25) 37.5-25 MG tablet   Other Relevant Orders   CBC with Differential/Platelet   Comprehensive metabolic panel with GFR   Urinalysis, Routine w reflex microscopic   Bronchiectasis (HCC) (Chronic)   Chronic loose cough and voice weakness Was on Symbicort  in the past but unsure of benefit. Will resume.      Relevant Medications   budesonide -formoterol  (SYMBICORT ) 160-4.5 MCG/ACT inhaler   Insomnia   Chronic nightly symptoms despite nightly Elavil . She sleeps about 3 hours then wakes up and can not go back to sleep       Relevant Medications   Suvorexant  (BELSOMRA ) 10 MG TABS    Gastroesophageal reflux disease without esophagitis (Chronic)   Reflux symptoms are fairly well controlled on pantoprazole  bid. She is having frequent episodes of choking/dysphagia at least once per week. I recommend seeing GI again.       Relevant Medications   pantoprazole  (PROTONIX ) 40 MG tablet   Other Relevant Orders   CBC with Differential/Platelet   Ambulatory referral to Gastroenterology   Aortic atherosclerosis (HCC) (Chronic)   Lipids managed with diet only. Lab Results  Component Value Date   LDLCALC 91 11/05/2022         Relevant Medications   bisoprolol  (ZEBETA ) 5 MG tablet   triamterene -hydrochlorothiazide (MAXZIDE-25) 37.5-25 MG tablet   Other Relevant Orders   Lipid panel   CKD stage 3b, GFR 30-44 ml/min (HCC)   Continue to avoid nephrotoxic agents. Will monitor labs every 6 months.      Relevant Orders   Comprehensive metabolic panel with GFR  Restless leg syndrome   Previously on gabapentin  - has been out for months and having more symptoms  Will resume nightly dosing      Relevant Medications   gabapentin  (NEURONTIN ) 100 MG capsule   Other Visit Diagnoses       Annual physical exam    -  Primary   up to date on immunizations recommend continued annual mammograms     Encounter for screening mammogram for breast cancer       Relevant Orders   MM 3D SCREENING MAMMOGRAM BILATERAL BREAST     Elevated TSH       Relevant Orders   TSH + free T4     Reducible right inguinal hernia       Relevant Orders   Ambulatory referral to General Surgery      She plans to change PCP to [facility] - Dr. Alla.  No follow-ups on file.    Jodi HILARIO Adie, MD [facility] [place]

## 2023-11-18 NOTE — Assessment & Plan Note (Signed)
 Clinically stable on Bupropion  and Elavil .   No SI or HI on evaluation. Plan to continue same medications for now.

## 2023-11-18 NOTE — Assessment & Plan Note (Signed)
 Blood pressure is well controlled on Zebeta  and hydrochlorothiazide. No medication side effects noted. Plan to continue current medications.

## 2023-11-18 NOTE — Assessment & Plan Note (Signed)
 Chronic loose cough and voice weakness Was on Symbicort  in the past but unsure of benefit. Will resume.

## 2023-11-18 NOTE — Assessment & Plan Note (Addendum)
 Chronic nightly symptoms despite nightly Elavil . She sleeps about 3 hours then wakes up and can not go back to sleep

## 2023-11-18 NOTE — Assessment & Plan Note (Signed)
 Previously on gabapentin  - has been out for months and having more symptoms  Will resume nightly dosing

## 2023-11-19 ENCOUNTER — Ambulatory Visit: Payer: Self-pay | Admitting: Internal Medicine

## 2023-11-19 LAB — CBC WITH DIFFERENTIAL/PLATELET
Basophils Absolute: 0.1 x10E3/uL (ref 0.0–0.2)
Basos: 1 %
EOS (ABSOLUTE): 0.3 x10E3/uL (ref 0.0–0.4)
Eos: 5 %
Hematocrit: 35.9 % (ref 34.0–46.6)
Hemoglobin: 12.1 g/dL (ref 11.1–15.9)
Immature Grans (Abs): 0 x10E3/uL (ref 0.0–0.1)
Immature Granulocytes: 0 %
Lymphocytes Absolute: 1.9 x10E3/uL (ref 0.7–3.1)
Lymphs: 30 %
MCH: 33.2 pg — ABNORMAL HIGH (ref 26.6–33.0)
MCHC: 33.7 g/dL (ref 31.5–35.7)
MCV: 98 fL — ABNORMAL HIGH (ref 79–97)
Monocytes Absolute: 0.4 x10E3/uL (ref 0.1–0.9)
Monocytes: 6 %
Neutrophils Absolute: 3.7 x10E3/uL (ref 1.4–7.0)
Neutrophils: 58 %
Platelets: 313 x10E3/uL (ref 150–450)
RBC: 3.65 x10E6/uL — ABNORMAL LOW (ref 3.77–5.28)
RDW: 12.6 % (ref 11.7–15.4)
WBC: 6.3 x10E3/uL (ref 3.4–10.8)

## 2023-11-19 LAB — COMPREHENSIVE METABOLIC PANEL WITH GFR
ALT: 16 IU/L (ref 0–32)
AST: 22 IU/L (ref 0–40)
Albumin: 4.9 g/dL — ABNORMAL HIGH (ref 3.7–4.7)
Alkaline Phosphatase: 87 IU/L (ref 44–121)
BUN/Creatinine Ratio: 18 (ref 12–28)
BUN: 18 mg/dL (ref 8–27)
Bilirubin Total: 0.5 mg/dL (ref 0.0–1.2)
CO2: 22 mmol/L (ref 20–29)
Calcium: 10.2 mg/dL (ref 8.7–10.3)
Chloride: 103 mmol/L (ref 96–106)
Creatinine, Ser: 1 mg/dL (ref 0.57–1.00)
Globulin, Total: 2.5 g/dL (ref 1.5–4.5)
Glucose: 96 mg/dL (ref 70–99)
Potassium: 4.6 mmol/L (ref 3.5–5.2)
Sodium: 142 mmol/L (ref 134–144)
Total Protein: 7.4 g/dL (ref 6.0–8.5)
eGFR: 55 mL/min/1.73 — ABNORMAL LOW (ref >59)

## 2023-11-19 LAB — LIPID PANEL
Chol/HDL Ratio: 3.3 ratio (ref 0.0–4.4)
Cholesterol, Total: 172 mg/dL (ref 100–199)
HDL: 52 mg/dL (ref >39)
LDL Chol Calc (NIH): 103 mg/dL — ABNORMAL HIGH (ref 0–99)
Triglycerides: 95 mg/dL (ref 0–149)
VLDL Cholesterol Cal: 17 mg/dL (ref 5–40)

## 2023-11-19 LAB — URINALYSIS, ROUTINE W REFLEX MICROSCOPIC
Bilirubin, UA: NEGATIVE
Glucose, UA: NEGATIVE
Ketones, UA: NEGATIVE
Leukocytes,UA: NEGATIVE
Nitrite, UA: NEGATIVE
Protein,UA: NEGATIVE
RBC, UA: NEGATIVE
Specific Gravity, UA: 1.022 (ref 1.005–1.030)
Urobilinogen, Ur: 1 mg/dL (ref 0.2–1.0)
pH, UA: 6.5 (ref 5.0–7.5)

## 2023-11-19 LAB — TSH+FREE T4
Free T4: 1.18 ng/dL (ref 0.82–1.77)
TSH: 3.09 u[IU]/mL (ref 0.450–4.500)

## 2023-11-20 ENCOUNTER — Other Ambulatory Visit (HOSPITAL_COMMUNITY): Payer: Self-pay

## 2023-11-20 ENCOUNTER — Telehealth: Payer: Self-pay | Admitting: Pharmacy Technician

## 2023-11-20 NOTE — Telephone Encounter (Signed)
 Pharmacy Patient Advocate Encounter   Received notification from RX Request Messages that prior authorization for Budesonide -Formoterol  Fumarate 160-4.5MCG/ACT aerosol  is required/requested.   Insurance verification completed.   The patient is insured through [facility] ADVANTAGE/RX ADVANCE .   Per test claim: PA required; PA submitted to above mentioned insurance via Latent Key/confirmation #/EOC [id number] Status is pending

## 2023-11-20 NOTE — Telephone Encounter (Signed)
 Pharmacy Patient Advocate Encounter  Received notification from [facility] ADVANTAGE/RX ADVANCE that Prior Authorization for Budesonide -Formoterol  Fumarate 160-4.5MCG/ACT aerosol  has been DENIED.  Full denial letter will be uploaded to the media tab. See denial reason below.     PA #/Case ID/Reference #: [id number]

## 2023-11-20 NOTE — Telephone Encounter (Signed)
 Requested medication (s) are due for refill today: yes  Requested medication (s) are on the active medication list: yes  Last refill:  11/18/23  Future visit scheduled: No  Notes to clinic:  Pharmacy comment: Alternative Requested:NOT ON FORMULARY.      Requested Prescriptions  Pending Prescriptions Disp Refills   SYMBICORT  160-4.5 MCG/ACT inhaler [Pharmacy Med Name: SYMBICORT  160-4.5 MCG INHALER] 10.2 each 3    Sig: INHALE 2 PUFFS INTO THE LUNGS TWICE A DAY     Pulmonology:  Combination Products Passed - 11/20/2023  9:25 AM      Passed - Valid encounter within last 12 months    Recent Outpatient Visits           2 days ago Annual physical exam   [facility] at [facility], Leita DEL, MD   2 months ago Acute non-recurrent maxillary sinusitis   [place] [facility] at [facility], Leita DEL, MD   5 months ago Essential hypertension   [facility] at [facility], Leita DEL, MD

## 2023-11-20 NOTE — Telephone Encounter (Signed)
 PA request has been Submitted. New Encounter has been or will be created for follow up. For additional info see Pharmacy Prior Auth telephone encounter from 11/20/23.

## 2023-11-21 ENCOUNTER — Other Ambulatory Visit: Payer: Self-pay | Admitting: Internal Medicine

## 2023-11-21 DIAGNOSIS — J479 Bronchiectasis, uncomplicated: Secondary | ICD-10-CM

## 2023-11-21 MED ORDER — QVAR REDIHALER 40 MCG/ACT IN AERB: 1.0000 | INHALATION_SPRAY | Freq: Two times a day (BID) | RESPIRATORY_TRACT | 5 refills | Status: AC

## 2023-11-21 NOTE — Progress Notes (Unsigned)
 Date:  11/21/2023   Name:  Jodi Bartlett   DOB:  [birth year]   MRN:  [id number]   Chief Complaint: No chief complaint on file.  HPI  Review of Systems   Lab Results  Component Value Date   NA 142 11/18/2023   K 4.6 11/18/2023   CO2 22 11/18/2023   GLUCOSE 96 11/18/2023   BUN 18 11/18/2023   CREATININE 1.00 11/18/2023   CALCIUM 10.2 11/18/2023   EGFR 55 (L) 11/18/2023   GFRNONAA 20 (L) 08/07/2022   Lab Results  Component Value Date   CHOL 172 11/18/2023   HDL 52 11/18/2023   LDLCALC 103 (H) 11/18/2023   TRIG 95 11/18/2023   CHOLHDL 3.3 11/18/2023   Lab Results  Component Value Date   TSH 3.090 11/18/2023   No results found for: HGBA1C Lab Results  Component Value Date   WBC 6.3 11/18/2023   HGB 12.1 11/18/2023   HCT 35.9 11/18/2023   MCV 98 (H) 11/18/2023   PLT 313 11/18/2023   Lab Results  Component Value Date   ALT 16 11/18/2023   AST 22 11/18/2023   ALKPHOS 87 11/18/2023   BILITOT 0.5 11/18/2023   Lab Results  Component Value Date   VD25OH 59.3 11/05/2022     Patient Active Problem List   Diagnosis Date Noted   Restless leg syndrome 11/18/2023   Osteoporosis 06/20/2020   CKD stage 3b, GFR 30-44 ml/min (HCC) 01/12/2020   Migraine without aura and without status migrainosus, not intractable 02/03/2019   Hiatal hernia 05/21/2018   Aortic atherosclerosis (HCC) 05/21/2018   Gastroesophageal reflux disease without esophagitis 12/31/2017   Degenerative lumbar spinal stenosis 01/01/2016   Primary osteoarthritis of both hips 01/01/2016   Insomnia 07/17/2012   Bronchiectasis (HCC) 03/02/2012   Anemia of chronic kidney failure, stage 3 (moderate) (HCC) 11/13/2011   Vitamin D  deficiency 10/11/2008   B12 deficiency 10/04/2008   IBS 10/01/2007   Major depression, single episode, in complete remission (HCC) 07/31/2007   Essential hypertension 07/31/2007   Chronic back pain 07/31/2007   History of colonic polyps 07/31/2007    No Known  Allergies  Past Surgical History:  Procedure Laterality Date   ABDOMINAL HYSTERECTOMY  1962   ovarian cancer   APPENDECTOMY     breast biopsy- benign     BREAST CYST EXCISION Left    bunion/morton's neuroma  06/2005   right foot    carpal tunnel release-right     CHOLECYSTECTOMY  1994   colitis transfusion     hosp at 25   COLONOSCOPY     very difficul- supp with BE past   EGD tight ues sphincter  09/2007   with dialtion   FOOT SURGERY  12/2007   TMT fusion and bunionectomy   HEMORRHOID SURGERY     INCISIONAL HERNIA REPAIR  1995   after ccy   left knee surgery     left shoulder replacement     stress cardiolite  11/2008   VIDEO BRONCHOSCOPY  04/22/2012   Procedure: VIDEO BRONCHOSCOPY WITH FLUORO;  Surgeon: Vicenta KATHEE Lennert, MD;  Location: [facility];  Service: Cardiopulmonary;  Laterality: Bilateral;    Social History   Tobacco Use   Smoking status: Never   Smokeless tobacco: Never   Tobacco comments:    smoking cessation materials not required  Vaping Use   Vaping status: Never Used  Substance Use Topics   Alcohol use: Yes    Comment: social wine  Drug use: No     Medication list has been reviewed and updated.  No outpatient medications have been marked as taking for the 11/21/23 encounter (Orders Only) with Bartlett Jodi DEL, MD.       11/18/2023    1:06 PM 09/09/2023    9:47 AM 05/26/2023    8:31 AM 11/05/2022   10:33 AM  GAD 7 : Generalized Anxiety Score  Nervous, Anxious, on Edge 0 0 0 0  Control/stop worrying 0 0 0 0  Worry too much - different things 0 0 0 0  Trouble relaxing 0 0 0 0  Restless 0 0 0 0  Easily annoyed or irritable 0 0 0 0  Afraid - awful might happen 0 0 0 0  Total GAD 7 Score 0 0 0 0  Anxiety Difficulty Not difficult at all Not difficult at all Not difficult at all Not difficult at all       11/18/2023    1:06 PM 09/09/2023    9:47 AM 05/26/2023    8:31 AM  Depression screen PHQ 2/9  Decreased Interest 0 0 0  Down,  Depressed, Hopeless 0 0 0  PHQ - 2 Score 0 0 0  Altered sleeping 2 0 0  Tired, decreased energy 3 3 0  Change in appetite 3 0 0  Feeling bad or failure about yourself  0 0 0  Trouble concentrating 0 0 0  Moving slowly or fidgety/restless 0 0 0  Suicidal thoughts 0 0 0  PHQ-9 Score 8 3 0  Difficult doing work/chores Not difficult at all Not difficult at all Not difficult at all    BP Readings from Last 3 Encounters:  11/18/23 122/78  09/09/23 116/72  05/26/23 128/64    Physical Exam  Wt Readings from Last 3 Encounters:  11/18/23 131 lb 9.6 oz (59.7 kg)  09/09/23 131 lb 2 oz (59.5 kg)  05/26/23 135 lb (61.2 kg)    There were no vitals taken for this visit.  Assessment and Plan:  Problem List Items Addressed This Visit   None   No follow-ups on file.    Jodi HILARIO Justus, MD [facility] Primary Care and Sports Medicine [place]

## 2023-11-24 ENCOUNTER — Other Ambulatory Visit (HOSPITAL_COMMUNITY): Payer: Self-pay

## 2023-11-28 ENCOUNTER — Other Ambulatory Visit: Payer: Self-pay | Admitting: Internal Medicine

## 2023-11-28 DIAGNOSIS — F325 Major depressive disorder, single episode, in full remission: Secondary | ICD-10-CM

## 2023-11-30 NOTE — Telephone Encounter (Signed)
 Requested Prescriptions  Pending Prescriptions Disp Refills   amitriptyline  (ELAVIL ) 50 MG tablet [Pharmacy Med Name: AMITRIPTYLINE  HCL 50 MG TAB] 90 tablet 0    Sig: TAKE 1 TABLET BY MOUTH EVERYDAY AT BEDTIME     Psychiatry:  Antidepressants - Heterocyclics (TCAs) Passed - 11/30/2023  9:12 AM      Passed - Completed PHQ-2 or PHQ-9 in the last 360 days      Passed - Valid encounter within last 6 months    Recent Outpatient Visits           1 week ago Annual physical exam   [facility] Medicine at [facility], Leita DEL, MD   2 months ago Acute non-recurrent maxillary sinusitis    Primary Care & Sports Medicine at [facility], Leita DEL, MD   6 months ago Essential hypertension   [facility] Primary Care & Sports Medicine at [facility], Leita DEL, MD

## 2023-12-02 ENCOUNTER — Telehealth: Payer: Self-pay | Admitting: Internal Medicine

## 2023-12-02 NOTE — Telephone Encounter (Signed)
 Copied from CRM [id number]. Topic: General - Call Back - No Documentation >> Dec 02, 2023 11:13 AM Precious C wrote: Reason for CRM: Pt requesting a callback from Dr. Justus regarding location change of the referrals that was sent over. Callback [phone]

## 2023-12-03 NOTE — Telephone Encounter (Signed)
Left voice mail about message 

## 2023-12-24 ENCOUNTER — Other Ambulatory Visit: Payer: Self-pay | Admitting: Internal Medicine

## 2023-12-24 DIAGNOSIS — F325 Major depressive disorder, single episode, in full remission: Secondary | ICD-10-CM

## 2023-12-26 NOTE — Telephone Encounter (Signed)
 Requested medication (s) are due for refill today: yes  Requested medication (s) are on the active medication list: yes  Last refill:  03/06/23  Future visit scheduled: no  Notes to clinic:  Unable to refill per protocol, cannot delegate.      Requested Prescriptions  Pending Prescriptions Disp Refills   ALPRAZolam  (XANAX ) 0.25 MG tablet [Pharmacy Med Name: ALPRAZOLAM  0.25 MG TABLET] 30 tablet     Sig: TAKE 1 TABLET BY MOUTH AT BEDTIME AS NEEDED FOR ANXIETY.     Not Delegated - Psychiatry: Anxiolytics/Hypnotics 2 Failed - 12/26/2023  9:36 AM      Failed - This refill cannot be delegated      Failed - Urine Drug Screen completed in last 360 days      Passed - Patient is not pregnant      Passed - Valid encounter within last 6 months    Recent Outpatient Visits           1 month ago Annual physical exam   [place] Primary Care & Sports Medicine at [facility], Leita DEL, MD   3 months ago Acute non-recurrent maxillary sinusitis   [place] [facility] & [facility] Medicine at [facility], Leita DEL, MD   7 months ago Essential hypertension   [facility] Primary Care & Sports Medicine at [facility], Leita DEL, MD

## 2023-12-26 NOTE — Telephone Encounter (Signed)
 Please review.  KP

## 2024-01-21 ENCOUNTER — Ambulatory Visit

## 2024-01-21 DIAGNOSIS — Z0000 Encounter for general adult medical examination without abnormal findings: Secondary | ICD-10-CM

## 2024-01-21 NOTE — Progress Notes (Signed)
 Subjective:   Jodi Bartlett is a 88 y.o. who presents for a Medicare Wellness preventive visit.  As a reminder, Annual Wellness Visits don't include a physical exam, and some assessments may be limited, especially if this visit is performed virtually. We may recommend an in-person follow-up visit with your provider if needed.  Visit Complete: Virtual I connected with  Jodi Bartlett on 01/21/24 by a audio enabled telemedicine application and verified that I am speaking with the correct person using two identifiers.  Patient Location: Home  Provider Location: Office/Clinic  I discussed the limitations of evaluation and management by telemedicine. The patient expressed understanding and agreed to proceed.  Vital Signs: Because this visit was a virtual/telehealth visit, some criteria may be missing or patient reported. Any vitals not documented were not able to be obtained and vitals that have been documented are patient reported.  VideoDeclined- This patient declined Librarian, academic. Therefore the visit was completed with audio only.  Persons Participating in Visit: Patient.  AWV Questionnaire: No: Patient Medicare AWV questionnaire was not completed prior to this visit.  Cardiac Risk Factors include: advanced age (>31men, >44 women);hypertension     Objective:    Today's Vitals   01/21/24 1314  PainSc: 4    There is no height or weight on file to calculate BMI.     01/21/2024    1:21 PM 10/23/2022   10:14 AM 08/07/2022    1:42 PM 05/29/2020    2:22 PM 05/26/2019    2:05 PM 01/28/2019    3:51 PM 05/18/2018    2:21 PM  Advanced Directives  Does Patient Have a Medical Advance Directive? No No No No Yes No No   Type of Agricultural consultant;Living will    Does patient want to make changes to medical advance directive?     Yes (MAU/Ambulatory/Procedural Areas - Information given)    Copy of Healthcare Power of Attorney in  Chart?     No - copy requested    Would patient like information on creating a medical advance directive? No - Patient declined No - Patient declined No - Patient declined Yes (MAU/Ambulatory/Procedural Areas - Information given)   Yes (MAU/Ambulatory/Procedural Areas - Information given)      Data saved with a previous flowsheet row definition    Current Medications (verified) Outpatient Encounter Medications as of 01/21/2024  Medication Sig   acetaminophen  (TYLENOL ) 500 MG tablet Take 500 mg by mouth every 6 (six) hours as needed.   ALPRAZolam  (XANAX ) 0.25 MG tablet TAKE 1 TABLET BY MOUTH AT BEDTIME AS NEEDED FOR ANXIETY.   amitriptyline  (ELAVIL ) 50 MG tablet TAKE 1 TABLET BY MOUTH EVERYDAY AT BEDTIME   bisoprolol  (ZEBETA ) 5 MG tablet Take 1 tablet (5 mg total) by mouth in the morning and at bedtime.   buPROPion  (WELLBUTRIN  SR) 150 MG 12 hr tablet Take 2 tablets (300 mg total) by mouth daily.   Cholecalciferol  (VITAMIN D3) 2000 units TABS Take by mouth.   gabapentin  (NEURONTIN ) 100 MG capsule Take 2 capsules (200 mg total) by mouth at bedtime.   pantoprazole  (PROTONIX ) 40 MG tablet Take 1 tablet (40 mg total) by mouth 2 (two) times daily.   triamterene -hydrochlorothiazide (MAXZIDE-25) 37.5-25 MG tablet Take 1 tablet by mouth daily.   vitamin B-12 (CYANOCOBALAMIN ) 1000 MCG tablet Take 1,000 mcg by mouth daily.   beclomethasone (QVAR  REDIHALER) 40 MCG/ACT inhaler Inhale 1 puff into the lungs 2 (  two) times daily. (Patient not taking: Reported on 01/21/2024)   Suvorexant  (BELSOMRA ) 10 MG TABS Take 1 tablet (10 mg total) by mouth at bedtime as needed. (Patient not taking: Reported on 01/21/2024)   No facility-administered encounter medications on file as of 01/21/2024.    Allergies (verified) Patient has no known allergies.   History: Past Medical History:  Diagnosis Date   Abnormality of lung on CXR 01/21/2012   Noted on CXR 2013 - CT suggested a mass but follow up scans showed  resolution Last CXR 10/2015 - mild apical scarring and mild COPD   Allergy    Anemia    hx of   Anxiety    Arthritis    Back pain    Blood transfusion without reported diagnosis    Bunion 10/19/2007   Depressed    Diverticulosis 2010   Colonoscopy   Dizziness    vertigo   GERD (gastroesophageal reflux disease)    Hiatal hernia    EGD    HTN (hypertension)    Hx of colonic polyps 2010   Colonoscopy   IBS (irritable bowel syndrome)    Interstitial cystitis    OA (ocular albinism)    Ovarian cancer (HCC)    Personal history of chemotherapy    Stricture and stenosis of esophagus 2009   EGD    UTI (lower urinary tract infection)    hx of     Past Surgical History:  Procedure Laterality Date   ABDOMINAL HYSTERECTOMY  1962   ovarian cancer   APPENDECTOMY     breast biopsy- benign     BREAST CYST EXCISION Left    bunion/morton's neuroma  06/2005   right foot    carpal tunnel release-right     CHOLECYSTECTOMY  1994   colitis transfusion     hosp at 25   COLONOSCOPY     very difficul- supp with BE past   EGD tight ues sphincter  09/2007   with dialtion   FOOT SURGERY  12/2007   TMT fusion and bunionectomy   HEMORRHOID SURGERY     INCISIONAL HERNIA REPAIR  1995   after ccy   left knee surgery     left shoulder replacement     stress cardiolite  11/2008   VIDEO BRONCHOSCOPY  04/22/2012   Procedure: VIDEO BRONCHOSCOPY WITH FLUORO;  Surgeon: Vicenta KATHEE Lennert, MD;  Location: [facility];  Service: Cardiopulmonary;  Laterality: Bilateral;   Family History  Problem Relation Age of Onset   Stroke Mother    Coronary artery disease Father    Hypertension Father    Heart disease Father        CAD   Hypertension Sister    Hyperlipidemia Sister    Ovarian cancer Maternal Grandmother    Leukemia Maternal Grandfather    Leukemia Maternal Uncle    Colon cancer Neg Hx    Esophageal cancer Neg Hx    Rectal cancer Neg Hx    Stomach cancer Neg Hx    Breast cancer Neg Hx     Social History   Socioeconomic History   Marital status: Widowed    Spouse name: Not on file   Number of children: 1   Years of education: Not on file   Highest education level: Associate degree: occupational, Scientist, product/process development, or vocational program  Occupational History   Occupation: COSMETOLOGIST    Comment: Retired   Tobacco Use   Smoking status: Never   Smokeless tobacco: Never   Tobacco comments:  smoking cessation materials not required  Vaping Use   Vaping status: Never Used  Substance and Sexual Activity   Alcohol use: Yes    Comment: social wine    Drug use: No   Sexual activity: Not Currently  Other Topics Concern   Not on file  Social History Narrative   1 daughter. Self employed, retired Interior and spatial designer. Daily caffeine. Lives alone.    Social Drivers of Corporate investment banker Strain: Low Risk  (01/21/2024)   Overall Financial Resource Strain (CARDIA)    Difficulty of Paying Living Expenses: Not hard at all  Food Insecurity: No Food Insecurity (01/21/2024)   Hunger Vital Sign    Worried About Running Out of Food in the Last Year: Never true    Ran Out of Food in the Last Year: Never true  Transportation Needs: No Transportation Needs (01/21/2024)   PRAPARE - Administrator, Civil Service (Medical): No    Lack of Transportation (Non-Medical): No  Physical Activity: Insufficiently Active (01/21/2024)   Exercise Vital Sign    Days of Exercise per Week: 3 days    Minutes of Exercise per Session: 40 min  Stress: No Stress Concern Present (01/21/2024)   [employer] of Occupational Health - Occupational Stress Questionnaire    Feeling of Stress: Not at all  Social Connections: Moderately Isolated (01/21/2024)   Social Connection and Isolation Panel    Frequency of Communication with Friends and Family: More than three times a week    Frequency of Social Gatherings with Friends and Family: More than three times a week    Attends Religious  Services: 1 to 4 times per year    Active Member of [employer] or Organizations: No    Attends Banker Meetings: Never    Marital Status: Widowed    Tobacco Counseling Counseling given: Not Answered Tobacco comments: smoking cessation materials not required    Clinical Intake:  Pre-visit preparation completed: Yes  Pain : 0-10 Pain Score: 4  Pain Type: Chronic pain Pain Location: Back Pain Orientation: Lower Pain Radiating Towards: legs ache Pain Descriptors / Indicators: Aching, Constant Pain Onset: More than a month ago Pain Frequency: Constant Pain Relieving Factors: es tylenol  takes edge off  Pain Relieving Factors: es tylenol  takes edge off  BMI - recorded: 24 Nutritional Status: BMI of 19-24  Normal Nutritional Risks: None Diabetes: No  No results found for: HGBA1C   How often do you need to have someone help you when you read instructions, pamphlets, or other written materials from your doctor or pharmacy?: 1 - Never  Interpreter Needed?: No  Information entered by :: JHONNIE DAS, LPN   Activities of Daily Living     01/21/2024    1:23 PM  In your present state of health, do you have any difficulty performing the following activities:  Hearing? 0  Vision? 0  Difficulty concentrating or making decisions? 0  Walking or climbing stairs? 0  Dressing or bathing? 0  Doing errands, shopping? 0  Preparing Food and eating ? N  Using the Toilet? N  In the past six months, have you accidently leaked urine? Y  Do you have problems with loss of bowel control? N  Managing your Medications? N  Managing your Finances? N  Housekeeping or managing your Housekeeping? N    Patient Care Team: Justus Leita DEL, MD as PCP - General (Internal Medicine) Kathlynn Sharper, MD as Consulting Physician (Orthopedic Surgery) Jinny Carmine, MD as Consulting  Physician (Gastroenterology) Charlene Debby JAYSON DEVONNA as Physician Assistant (Orthopedic Surgery) Bernardino Elsie PARAS, MD as Attending Physician (Psychiatry) Avanell Katz, MD as Referring Physician (Physical Medicine and Rehabilitation) Tamea Dedra CROME, MD as Consulting Physician (Pulmonary Disease) Perla Evalene PARAS, MD as Consulting Physician (Cardiology) Jaye Elsie, MD as Referring Physician (Ophthalmology)  I have updated your Care Teams any recent Medical Services you may have received from other providers in the past year.     Assessment:   This is a routine wellness examination for Jodi Bartlett.  Hearing/Vision screen Hearing Screening - Comments:: NO AIDS Vision Screening - Comments:: READERS, HAD CATARACT SGY- DR.PORFILIO- SEEN ONCE PER YEAR   Goals Addressed             This Visit's Progress    DIET - EAT MORE FRUITS AND VEGETABLES         Depression Screen     01/21/2024    1:19 PM 11/18/2023    1:06 PM 09/09/2023    9:47 AM 05/26/2023    8:31 AM 11/05/2022   10:33 AM 10/23/2022   10:20 AM 10/23/2022   10:13 AM  PHQ 2/9 Scores  PHQ - 2 Score 0 0 0 0 0 0 0  PHQ- 9 Score 0 8 3 0 0      Fall Risk     01/21/2024    1:23 PM 11/18/2023    1:09 PM 11/18/2023    1:06 PM 09/09/2023    9:47 AM 05/26/2023    8:31 AM  Fall Risk   Falls in the past year? 1 1 0 0 0  Number falls in past yr: 1 0 0 0 0  Injury with Fall? 0 0 0 0 0  Risk for fall due to :  History of fall(s) No Fall Risks No Fall Risks No Fall Risks  Follow up Falls evaluation completed;Falls prevention discussed Falls evaluation completed Falls evaluation completed Falls evaluation completed Falls evaluation completed    MEDICARE RISK AT HOME:  Medicare Risk at Home Any stairs in or around the home?: Yes If so, are there any without handrails?: No Home free of loose throw rugs in walkways, pet beds, electrical cords, etc?: Yes Adequate lighting in your home to reduce risk of falls?: Yes Life alert?: No Use of a cane, walker or w/c?: No Grab bars in the bathroom?: No Shower chair or bench in shower?:  No Elevated toilet seat or a handicapped toilet?: No  TIMED UP AND GO:  Was the test performed?  No  Cognitive Function: 6CIT completed        01/21/2024    1:25 PM 10/23/2022   10:20 AM 05/18/2018    2:30 PM 05/21/2016   11:00 AM  6CIT Screen  What Year? 0 points 0 points 0 points 0 points  What month? 0 points 0 points 0 points 0 points  What time? 0 points 0 points 0 points 0 points  Count back from 20 0 points 0 points 0 points 0 points  Months in reverse 0 points 0 points 0 points 0 points  Repeat phrase 0 points 0 points 0 points 0 points  Total Score 0 points 0 points 0 points 0 points    Immunizations Immunization History  Administered Date(s) Administered   Fluad Quad(high Dose 65+) 02/03/2019, 01/11/2020, 02/12/2021, 02/28/2022   INFLUENZA, HIGH DOSE SEASONAL PF 12/31/2017   Influenza Split 01/21/2012   Influenza,inj,Quad PF,6+ Mos 03/16/2014, 01/01/2016   PFIZER(Purple Top)SARS-COV-2 Vaccination 04/15/2019, 05/06/2019, 01/17/2020  PNEUMOCOCCAL CONJUGATE-20 11/05/2022   Pneumococcal Conjugate-13 05/21/2016   Pneumococcal Polysaccharide-23 08/03/2013   Td 04/01/1994, 11/18/2007    Screening Tests Health Maintenance  Topic Date Due   Mammogram  06/20/2021   COVID-19 Vaccine (4 - 2025-26 season) 12/01/2023   Zoster Vaccines- Shingrix (1 of 2) 02/18/2024 (Originally 01/13/1986)   Influenza Vaccine  06/29/2024 (Originally 10/31/2023)   Medicare Annual Wellness (AWV)  01/20/2025   Pneumococcal Vaccine: 50+ Years  Completed   DEXA SCAN  Completed   Meningococcal B Vaccine  Aged Out   DTaP/Tdap/Td  Discontinued    Health Maintenance Items Addressed: NEEDS FLU & SHINGRIX; HAS ORDER FOR MAMMOGRAM; AGED OUT OF COLONOSCOPY & BDS   Additional Screening:  Vision Screening: Recommended annual ophthalmology exams for early detection of glaucoma and other disorders of the eye. Is the patient up to date with their annual eye exam?  Yes  Who is the provider or what is  the name of the office in which the patient attends annual eye exams? DR.PORFILIO  Dental Screening: Recommended annual dental exams for proper oral hygiene  Community Resource Referral / Chronic Care Management: CRR required this visit?  No   CCM required this visit?  No   Plan:    I have personally reviewed and noted the following in the patient's chart:   Medical and social history Use of alcohol, tobacco or illicit drugs  Current medications and supplements including opioid prescriptions. Patient is not currently taking opioid prescriptions. Functional ability and status Nutritional status Physical activity Advanced directives List of other physicians Hospitalizations, surgeries, and ER visits in previous 12 months Vitals Screenings to include cognitive, depression, and falls Referrals and appointments  In addition, I have reviewed and discussed with patient certain preventive protocols, quality metrics, and best practice recommendations. A written personalized care plan for preventive services as well as general preventive health recommendations were provided to patient.   Jhonnie GORMAN Das, LPN   [id number]   After Visit Summary: (MyChart) Due to this being a telephonic visit, the after visit summary with patients personalized plan was offered to patient via MyChart   Notes: Nothing significant to report at this time.

## 2024-01-21 NOTE — Patient Instructions (Addendum)
 Ms. Anchondo,  Thank you for taking the time for your Medicare Wellness Visit. I appreciate your continued commitment to your health goals. Please review the care plan we discussed, and feel free to reach out if I can assist you further.  Medicare recommends these wellness visits once per year to help you and your care team stay ahead of potential health issues. These visits are designed to focus on prevention, allowing your provider to concentrate on managing your acute and chronic conditions during your regular appointments.  Please note that Annual Wellness Visits do not include a physical exam. Some assessments may be limited, especially if the visit was conducted virtually. If needed, we may recommend a separate in-person follow-up with your provider.  Ongoing Care Seeing your primary care provider every 3 to 6 months helps us  monitor your health and provide consistent, personalized care. Best wishes with your new doctor!  Referrals If a referral was made during today's visit and you haven't received any updates within two weeks, please contact the referred provider directly to check on the status. MAMMOGRAM HAS BEEN PREVIOUSLY ORDERED- JUST GIVE THEM A CALL! You have an order for:  []   2D Mammogram  [x]   3D Mammogram  []   Bone Density     Please call for appointment:  [facility]  [address] [place] [address] [phone] [facility] [address] [place], [place] [address] [phone] [facility] [address] [place], [place] [address] [phone]   Make sure to wear two-piece clothing.  No lotions, powders, or deodorants the day of the appointment. Make sure to bring picture ID and insurance card.  Bring list of medications you are currently taking including any supplements.   Schedule your [place] screening mammogram through MyChart!    Log into your MyChart account.  Go to 'Visit' (or 'Appointments' if on mobile App) --> Schedule an Appointment  Under 'Select a Reason for Visit' choose the Mammogram Screening option.  Complete the pre-visit questions and select the time and place that best fits your schedule.   Recommended Screenings:  Health Maintenance  Topic Date Due   Breast Cancer Screening  06/20/2021   COVID-19 Vaccine (4 - 2025-26 season) 12/01/2023   Zoster (Shingles) Vaccine (1 of 2) 02/18/2024*   Flu Shot  06/29/2024*   Medicare Annual Wellness Visit  01/20/2025   Pneumococcal Vaccine for age over 31  Completed   DEXA scan (bone density measurement)  Completed   Meningitis B Vaccine  Aged Out   DTaP/Tdap/Td vaccine  Discontinued  *Topic was postponed. The date shown is not the original due date.     Advance Care Planning is important because it: Ensures you receive medical care that aligns with your values, goals, and preferences. Provides guidance to your family and loved ones, reducing the emotional burden of decision-making during critical moments.  Vision: Annual vision screenings are recommended for early detection of glaucoma, cataracts, and diabetic retinopathy. These exams can also reveal signs of chronic conditions such as diabetes and high blood pressure.  Dental: Annual dental screenings help detect early signs of oral cancer, gum disease, and other conditions linked to overall health, including heart disease and diabetes.  Please see the attached documents for additional preventive care recommendations.   NEXT YEAR'S AWV SHOULD BE DONE BY YOUR NEW DOCTOR'S OFFICE

## 2024-02-04 ENCOUNTER — Ambulatory Visit: Payer: Self-pay | Admitting: Surgery

## 2024-02-09 DIAGNOSIS — F339 Major depressive disorder, recurrent, unspecified: Secondary | ICD-10-CM | POA: Diagnosis not present

## 2024-02-09 DIAGNOSIS — K4090 Unilateral inguinal hernia, without obstruction or gangrene, not specified as recurrent: Secondary | ICD-10-CM | POA: Diagnosis not present

## 2024-02-09 DIAGNOSIS — F330 Major depressive disorder, recurrent, mild: Secondary | ICD-10-CM | POA: Diagnosis not present

## 2024-02-09 DIAGNOSIS — I10 Essential (primary) hypertension: Secondary | ICD-10-CM | POA: Diagnosis not present

## 2024-02-09 DIAGNOSIS — M48062 Spinal stenosis, lumbar region with neurogenic claudication: Secondary | ICD-10-CM | POA: Diagnosis not present

## 2024-02-09 DIAGNOSIS — R55 Syncope and collapse: Secondary | ICD-10-CM | POA: Diagnosis not present

## 2024-02-11 DIAGNOSIS — M3501 Sicca syndrome with keratoconjunctivitis: Secondary | ICD-10-CM | POA: Diagnosis not present

## 2024-02-11 DIAGNOSIS — H43813 Vitreous degeneration, bilateral: Secondary | ICD-10-CM | POA: Diagnosis not present

## 2024-02-11 DIAGNOSIS — H26491 Other secondary cataract, right eye: Secondary | ICD-10-CM | POA: Diagnosis not present

## 2024-02-18 ENCOUNTER — Ambulatory Visit: Payer: Self-pay | Admitting: Surgery

## 2024-03-02 DIAGNOSIS — I10 Essential (primary) hypertension: Secondary | ICD-10-CM | POA: Diagnosis not present

## 2024-03-02 DIAGNOSIS — F5101 Primary insomnia: Secondary | ICD-10-CM | POA: Diagnosis not present

## 2024-03-02 DIAGNOSIS — R000 Tachycardia, unspecified: Secondary | ICD-10-CM | POA: Diagnosis not present

## 2024-03-19 ENCOUNTER — Other Ambulatory Visit: Payer: Self-pay | Admitting: Internal Medicine

## 2024-03-19 DIAGNOSIS — F325 Major depressive disorder, single episode, in full remission: Secondary | ICD-10-CM

## 2024-03-23 ENCOUNTER — Emergency Department

## 2024-03-23 ENCOUNTER — Emergency Department
Admission: EM | Admit: 2024-03-23 | Discharge: 2024-03-23 | Disposition: A | Discharge status: Home / Self Care | Attending: Emergency Medicine | Admitting: Emergency Medicine

## 2024-03-23 ENCOUNTER — Other Ambulatory Visit: Payer: Self-pay

## 2024-03-23 DIAGNOSIS — I10 Essential (primary) hypertension: Secondary | ICD-10-CM | POA: Diagnosis not present

## 2024-03-23 DIAGNOSIS — R519 Headache, unspecified: Secondary | ICD-10-CM | POA: Diagnosis present

## 2024-03-23 LAB — BASIC METABOLIC PANEL WITH GFR
Anion gap: 12 (ref 5–15)
BUN: 21 mg/dL (ref 8–23)
CO2: 28 mmol/L (ref 22–32)
Calcium: 10.1 mg/dL (ref 8.9–10.3)
Chloride: 103 mmol/L (ref 98–111)
Creatinine, Ser: 0.92 mg/dL (ref 0.44–1.00)
GFR, Estimated: >60 mL/min
Glucose, Bld: 120 mg/dL — ABNORMAL HIGH (ref 70–99)
Potassium: 3.7 mmol/L (ref 3.5–5.1)
Sodium: 142 mmol/L (ref 135–145)

## 2024-03-23 LAB — TROPONIN T, HIGH SENSITIVITY
Troponin T High Sensitivity: 16 ng/L (ref 0–19)
Troponin T High Sensitivity: <15 ng/L (ref 0–19)

## 2024-03-23 LAB — CBC
HCT: 34.2 % — ABNORMAL LOW (ref 36.0–46.0)
Hemoglobin: 11.2 g/dL — ABNORMAL LOW (ref 12.0–15.0)
MCH: 33 pg (ref 26.0–34.0)
MCHC: 32.7 g/dL (ref 30.0–36.0)
MCV: 100.9 fL — ABNORMAL HIGH (ref 80.0–100.0)
Platelets: 249 K/uL (ref 150–400)
RBC: 3.39 MIL/uL — ABNORMAL LOW (ref 3.87–5.11)
RDW: 12.4 % (ref 11.5–15.5)
WBC: 6.1 K/uL (ref 4.0–10.5)
nRBC: 0 % (ref 0.0–0.2)

## 2024-03-23 MED ORDER — CARVEDILOL 6.25 MG PO TABS: 6.2500 mg | ORAL_TABLET | Freq: Two times a day (BID) | ORAL | 1 refills | Status: AC

## 2024-03-23 MED ORDER — HYDRALAZINE HCL 20 MG/ML IJ SOLN
5.0000 mg | Freq: Once | INTRAMUSCULAR | Status: AC
  Administered 2024-03-23: 5 mg via INTRAVENOUS
  Filled 2024-03-23: qty 1

## 2024-03-23 NOTE — ED Triage Notes (Signed)
 Pt comes with headache, HTN and heart racing. Pt states she is on meds and doctor has been following her BP recently and making changes. Pt states pressure in chest and beating too fast.

## 2024-03-23 NOTE — Telephone Encounter (Signed)
 Requested Prescriptions  Pending Prescriptions Disp Refills   amitriptyline  (ELAVIL ) 50 MG tablet [Pharmacy Med Name: AMITRIPTYLINE  HCL 50 MG TAB] 90 tablet 0    Sig: TAKE 1 TABLET BY MOUTH EVERYDAY AT BEDTIME     Psychiatry:  Antidepressants - Heterocyclics (TCAs) Passed - 03/23/2024 10:03 AM      Passed - Completed PHQ-2 or PHQ-9 in the last 360 days      Passed - Valid encounter within last 6 months    Recent Outpatient Visits           4 months ago Annual physical exam   [facility] at [facility], Leita DEL, MD   6 months ago Acute non-recurrent maxillary sinusitis   [facility], Leita DEL, MD   10 months ago Essential hypertension   [facility] at [facility], Leita DEL, MD

## 2024-03-23 NOTE — ED Provider Notes (Signed)
 "  [facility] Provider Note    Event Date/Time   First MD Initiated Contact with Patient 03/23/24 1629     (approximate)   History   Hypertension and Headache   HPI  Jodi Bartlett is a 88 y.o. female presents to the urgency department today at the advice of her primary care doctor.  Patient saw her doctor today for further management of high blood pressure.  She states over the past 2 weeks her blood pressures.  Very high.  Her doctor has adjusted her medications.  Over these past 2 weeks she has also had chest pressure and headache.  At the doctor's office her blood pressure was high.  He did advise patient come to the emergency department for further workup and management.     Physical Exam   Triage Vital Signs: ED Triage Vitals  Encounter Vitals Group     BP 03/23/24 1430 (!) 229/103     Girls Systolic BP Percentile --      Girls Diastolic BP Percentile --      Boys Systolic BP Percentile --      Boys Diastolic BP Percentile --      Pulse Rate 03/23/24 1430 85     Resp 03/23/24 1430 19     Temp 03/23/24 1430 98.6 F (37 C)     Temp src --      SpO2 03/23/24 1430 97 %     Weight 03/23/24 1432 130 lb (59 kg)     Height 03/23/24 1432 5' 2 (1.575 m)     Head Circumference --      Peak Flow --      Pain Score --      Pain Loc --      Pain Education --      Exclude from Growth Chart --     Most recent vital signs: Vitals:   03/23/24 1430  BP: (!) 229/103  Pulse: 85  Resp: 19  Temp: 98.6 F (37 C)  SpO2: 97%   General: Awake, alert, oriented. CV:  Good peripheral perfusion. Regular rate and rhythm. Resp:  Normal effort. Lungs clear. Abd:  No distention. Non tender.   ED Results / Procedures / Treatments   Labs (all labs ordered are listed, but only abnormal results are displayed) Labs Reviewed  BASIC METABOLIC PANEL WITH GFR - Abnormal; Notable for the following components:      Result Value   Glucose, Bld 120 (*)    All other  components within normal limits  CBC - Abnormal; Notable for the following components:   RBC 3.39 (*)    Hemoglobin 11.2 (*)    HCT 34.2 (*)    MCV 100.9 (*)    All other components within normal limits  TROPONIN T, HIGH SENSITIVITY  TROPONIN T, HIGH SENSITIVITY     EKG  I, Guadalupe Eagles, attending physician, personally viewed and interpreted this EKG  EKG Time: 1434 Rate: 80 Rhythm: normal sinus rhythm Axis: normal Intervals: qtc 431 QRS: narrow, q waves v1, v2 ST changes: no st elevation Impression: abnormal ekg    RADIOLOGY I independently interpreted and visualized the CXR. My interpretation: No pneumonia Radiology interpretation:  IMPRESSION:  No active cardiopulmonary disease.    I independently interpreted and visualized the CT head. My interpretation: No ICH Radiology interpretation:  IMPRESSION:  1. No acute intracranial abnormality.  2. Age related atrophy and chronic small vessel ischemia.  3. Paranasal sinus mucosal thickening.  PROCEDURES:  Critical Care performed: No    MEDICATIONS ORDERED IN ED: Medications - No data to display   IMPRESSION / MDM / ASSESSMENT AND PLAN / ED COURSE  I reviewed the triage vital signs and the nursing notes.                              Differential diagnosis includes, but is not limited to, AKI, ACS, ICH, hypertension  Patient's presentation is most consistent with acute presentation with potential threat to life or bodily function.   Patient presented to the emergency department today from primary care doctor's office because of concerns for elevated blood pressure.  Here without findings concerning for acute kidney injury nor ACS.  Head CT without intracranial hemorrhage.  Patient was given medication blood pressure did decrease here.  At this time no evidence of endorgan damage.  I did discuss this with the patient.  Do think would be reasonable to try to increase her Coreg  dose.  Did encourage patient  to return for any worsening symptoms.     FINAL CLINICAL IMPRESSION(S) / ED DIAGNOSES   Final diagnoses:  Hypertension, unspecified type    Note:  This document was prepared using Dragon voice recognition software and may include unintentional dictation errors.    Floy Roberts, MD 03/23/24 [phone]  "

## 2024-03-23 NOTE — ED Notes (Signed)
 Pt taken to CT and they will bring to ED 1

## 2024-03-23 NOTE — Progress Notes (Signed)
 Chief Complaint  Patient presents with   Hypertension    HPI  88 year old female here with symptomatic uncontrolled hypertension  Uncontrolled hypertension: Going on for several days with associated headache and chest discomfort.  Taking all of her medications as prescribed.  Systolic blood pressure remaining above 180 despite taking medications.  ROS Review of systems is unremarkable for any active cardiac, respiratory, GI, GU, hematologic, neurologic, dermatologic, HEENT, or psychiatric symptoms except as noted above.  No fevers, chills, or constitutional symptoms.   Current Outpatient Medications  Medication Sig Dispense Refill   acetaminophen  (TYLENOL ) 500 MG tablet Take 1,000 mg by mouth every 8 (eight) hours as needed for Pain     carvediloL  (COREG ) 3.125 MG tablet Take 1 tablet (3.125 mg total) by mouth 2 (two) times daily with meals 60 tablet 3   chlorthalidone 25 MG tablet Take 0.5 tablets (12.5 mg total) by mouth once daily 50 tablet 1   cholecalciferol  (VITAMIN D3) 2,000 unit tablet Take 2,000 Units by mouth once daily     cyanocobalamin  (VITAMIN B12) 1000 MCG tablet Take 1,000 mcg by mouth once daily     DULoxetine (CYMBALTA) 30 MG DR capsule Take 1 capsule (30 mg total) by mouth once daily 30 capsule 3   gabapentin  (NEURONTIN ) 100 MG capsule Take 2 capsules (200 mg total) by mouth at bedtime 180 capsule 1   guaiFENesin  (MUCINEX ) 600 mg SR tablet Take 600 mg by mouth every 12 (twelve) hours as needed for Cough     losartan  (COZAAR ) 50 MG tablet Take 50 mg by mouth 2 (two) times daily     pantoprazole  (PROTONIX ) 40 MG DR tablet Take 40 mg by mouth 2 (two) times daily before meals     No current facility-administered medications for this visit.    Allergies as of 03/23/2024   (No Known Allergies)    Patient Active Problem List  Diagnosis   Essential hypertension   Spinal stenosis of lumbar region (MRI 06/23/15)   Mild episode of recurrent major depressive  disorder   Right inguinal hernia   Primary insomnia    Past Medical History:  Diagnosis Date   Anemia    Anxiety    Chronic kidney disease    COPD (chronic obstructive pulmonary disease) (CMS/HHS-HCC)    Depression    Diverticulosis    GERD (gastroesophageal reflux disease)    Hiatal hernia    Hypertension    IBS (irritable bowel syndrome)    IC (interstitial cystitis)    Insomnia    Lung mass    Osteoarthritis    Ovarian cancer (CMS/HHS-HCC)    Ulcerative colitis (CMS/HHS-HCC)    Vitamin B12 deficiency    Vitamin D  deficiency     Past Surgical History:  Procedure Laterality Date   APPENDECTOMY     Breast Biopsy- Benign     Bunionectomy Right    Carpal Tunnel Release- Right     CHOLECYSTECTOMY     HEMORRHOIDECTOMY EXTERNAL     HERNIA REPAIR     HYSTERECTOMY     Hx Ovarian Cancer   L knee surgery     L Shoulder Replacement     R Shoulder Surgery      Vitals:   03/23/24 1355 03/23/24 1403  BP: (!) 218/96 (!) 200/96  Pulse: 86   SpO2: 97%   Weight: 60.9 kg (134 lb 3.2 oz)   Height: 153.7 cm (5' 0.5)   PainSc:   6   PainLoc: Back  Body mass index is 25.78 kg/m.  Exam  General. Well appearing; NAD; VS reviewed     Eyes. Sclera and conjunctiva clear; Vision grossly intact; extraocular movements intact Neck. Supple.   Lungs. Respirations unlabored; clear to auscultation bilaterally Cardiovascular. Heart regular rate and rhythm without murmurs, gallops, or rubs Skin. Normal color and turgor Neurologic. Alert and oriented x3; no focal deficits  Assessment & Plan  Hypertensive emergency: Acute.  Current blood pressure 200/96 with associated headache and chest discomfort.  Plan to have her go to [facility] emergency room for further evaluation.  Will need close monitoring on telemetry.  Will need CT of the head.  Will need to have gradual lowering of blood pressure under close supervision.  Transferred to emergency room escorted  by her staff  ALDA CARPEN, MD

## 2024-04-11 NOTE — Progress Notes (Unsigned)
 Cardiology Office Note  Date:  04/11/2024   ID:  Jodi Bartlett, DOB [birth year], MRN [id number]  PCP:  Alla Amis, MD   No chief complaint on file.   HPI:  Jodi Bartlett is a 89 y.o. female with past medical history of: Past Medical History:  Diagnosis Date   Abnormality of lung on CXR 01/21/2012   Noted on CXR 2013 - CT suggested a mass but follow up scans showed resolution Last CXR 10/2015 - mild apical scarring and mild COPD   Allergy    Anemia    hx of   Anxiety    Arthritis    Back pain    Blood transfusion without reported diagnosis    Bunion 10/19/2007   Depressed    Diverticulosis 2010   Colonoscopy   Dizziness    vertigo   GERD (gastroesophageal reflux disease)    Hiatal hernia    EGD    HTN (hypertension)    Hx of colonic polyps 2010   Colonoscopy   IBS (irritable bowel syndrome)    Interstitial cystitis    OA (ocular albinism)    Ovarian cancer (HCC)    Personal history of chemotherapy    Stricture and stenosis of esophagus 2009   EGD    UTI (lower urinary tract infection)    hx of    Last seen in cardiology clinic in 2000 Who presents for follow-up of history of chest pain, hypertension   She reports that for several weeks to months she has had periodic chest pain radiating through to her back, posterior shoulder, left arm pain Getting worse,  Reports she is active at baseline Unclear if activity brings on her symptoms, sometimes at rest sometimes with activity Does not seem to have clear relationship with food or eating Does not think it is from chronic arthritic issues Hx of reverse shoulder replacement on the left Pain is different from shoulder pain   CT chest, performed recently for pulmonary issues, January 2020 Images pulled up and discussed with her Moderate aortic atherosclerosis noted Chronic hiatal hernia.   Prior hospitalizations discussed with her  In the hospital 05/2017 Hospital records reviewed with the patient in  detail UTI, sepsis, ARF weak and deconditioned need of rehab.    PMH:   has a past medical history of Abnormality of lung on CXR (01/21/2012), Allergy, Anemia, Anxiety, Arthritis, Back pain, Blood transfusion without reported diagnosis, Bunion (10/19/2007), Depressed, Diverticulosis (2010), Dizziness, GERD (gastroesophageal reflux disease), Hiatal hernia, HTN (hypertension), colonic polyps (2010), IBS (irritable bowel syndrome), Interstitial cystitis, OA (ocular albinism), Ovarian cancer (HCC), Personal history of chemotherapy, Stricture and stenosis of esophagus (2009), and UTI (lower urinary tract infection).   PSH:    Past Surgical History:  Procedure Laterality Date   ABDOMINAL HYSTERECTOMY  1962   ovarian cancer   APPENDECTOMY     breast biopsy- benign     BREAST CYST EXCISION Left    bunion/morton's neuroma  06/2005   right foot    carpal tunnel release-right     CHOLECYSTECTOMY  1994   colitis transfusion     hosp at 25   COLONOSCOPY     very difficul- supp with BE past   EGD tight ues sphincter  09/2007   with dialtion   FOOT SURGERY  12/2007   TMT fusion and bunionectomy   HEMORRHOID SURGERY     INCISIONAL HERNIA REPAIR  1995   after ccy   left knee surgery  left shoulder replacement     stress cardiolite  11/2008   VIDEO BRONCHOSCOPY  04/22/2012   Procedure: VIDEO BRONCHOSCOPY WITH FLUORO;  Surgeon: Vicenta KATHEE Lennert, MD;  Location: [facility];  Service: Cardiopulmonary;  Laterality: Bilateral;    Current Outpatient Medications  Medication Sig Dispense Refill   acetaminophen  (TYLENOL ) 500 MG tablet Take 500 mg by mouth every 6 (six) hours as needed.     ALPRAZolam  (XANAX ) 0.25 MG tablet TAKE 1 TABLET BY MOUTH AT BEDTIME AS NEEDED FOR ANXIETY. 30 tablet 0   amitriptyline  (ELAVIL ) 50 MG tablet TAKE 1 TABLET BY MOUTH EVERYDAY AT BEDTIME 90 tablet 0   beclomethasone (QVAR  REDIHALER) 40 MCG/ACT inhaler Inhale 1 puff into the lungs 2 (two) times daily. (Patient not  taking: Reported on 01/21/2024) 1 each 5   bisoprolol  (ZEBETA ) 5 MG tablet Take 1 tablet (5 mg total) by mouth in the morning and at bedtime. 180 tablet 1   buPROPion  (WELLBUTRIN  SR) 150 MG 12 hr tablet Take 2 tablets (300 mg total) by mouth daily. 180 tablet 1   carvedilol  (COREG ) 6.25 MG tablet Take 1 tablet (6.25 mg total) by mouth 2 (two) times daily. 60 tablet 1   Cholecalciferol  (VITAMIN D3) 2000 units TABS Take by mouth.     gabapentin  (NEURONTIN ) 100 MG capsule Take 2 capsules (200 mg total) by mouth at bedtime. 180 capsule 0   pantoprazole  (PROTONIX ) 40 MG tablet Take 1 tablet (40 mg total) by mouth 2 (two) times daily. 180 tablet 1   Suvorexant  (BELSOMRA ) 10 MG TABS Take 1 tablet (10 mg total) by mouth at bedtime as needed. (Patient not taking: Reported on 01/21/2024) 30 tablet 0   triamterene -hydrochlorothiazide (MAXZIDE-25) 37.5-25 MG tablet Take 1 tablet by mouth daily. 90 tablet 1   vitamin B-12 (CYANOCOBALAMIN ) 1000 MCG tablet Take 1,000 mcg by mouth daily.     No current facility-administered medications for this visit.     Allergies:   Patient has no known allergies.   Social History:  The patient  reports that she has never smoked. She has never used smokeless tobacco. She reports current alcohol use. She reports that she does not use drugs.   Family History:   family history includes Coronary artery disease in her father; Heart disease in her father; Hyperlipidemia in her sister; Hypertension in her father and sister; Leukemia in her maternal grandfather and maternal uncle; Ovarian cancer in her maternal grandmother; Stroke in her mother.    Review of Systems: ROS   PHYSICAL EXAM: VS:  There were no vitals taken for this visit. , BMI There is no height or weight on file to calculate BMI. GEN: Well nourished, well developed, in no acute distress HEENT: normal Neck: no JVD, carotid bruits, or masses Cardiac: RRR; no murmurs, rubs, or gallops,no edema  Respiratory:   clear to auscultation bilaterally, normal work of breathing GI: soft, nontender, nondistended, + BS MS: no deformity or atrophy Skin: warm and dry, no rash Neuro:  Strength and sensation are intact Psych: euthymic mood, full affect    Recent Labs: 11/18/2023: ALT 16; TSH 3.090 03/23/2024: BUN 21; Creatinine, Ser 0.92; Hemoglobin 11.2; Platelets 249; Potassium 3.7; Sodium 142    Lipid Panel Lab Results  Component Value Date   CHOL 172 11/18/2023   HDL 52 11/18/2023   LDLCALC 103 (H) 11/18/2023   TRIG 95 11/18/2023      Wt Readings from Last 3 Encounters:  03/23/24 130 lb (59 kg)  11/18/23 131  lb 9.6 oz (59.7 kg)  09/09/23 131 lb 2 oz (59.5 kg)       ASSESSMENT AND PLAN:  Problem List Items Addressed This Visit   None    Disposition:   F/U  12 months   Total encounter time more than 30 minutes  Greater than 50% was spent in counseling and coordination of care with the patient    Signed, Velinda Lunger, M.D., Ph.D. [facility] [place], [place] [phone]

## 2024-04-12 ENCOUNTER — Ambulatory Visit: Admitting: Cardiovascular Disease

## 2024-04-12 DIAGNOSIS — I10 Essential (primary) hypertension: Secondary | ICD-10-CM

## 2024-04-12 DIAGNOSIS — J479 Bronchiectasis, uncomplicated: Secondary | ICD-10-CM

## 2024-04-12 DIAGNOSIS — I700 Atherosclerosis of aorta: Secondary | ICD-10-CM

## 2024-04-12 DIAGNOSIS — R079 Chest pain, unspecified: Secondary | ICD-10-CM

## 2024-04-26 ENCOUNTER — Ambulatory Visit: Admitting: Cardiovascular Disease

## 2024-05-02 NOTE — Progress Notes (Unsigned)
 Cardiology Office Note  Date:  05/02/2024   ID:  Jodi Bartlett, DOB Nov 09, 1935, MRN [id number]  PCP:  Alla Amis, MD   No chief complaint on file.   HPI:  Jodi Bartlett is a 89 y.o. female with past medical history of: Past Medical History:  Diagnosis Date   Abnormality of lung on CXR 01/21/2012   Noted on CXR 2013 - CT suggested a mass but follow up scans showed resolution Last CXR 10/2015 - mild apical scarring and mild COPD   Allergy    Anemia    hx of   Anxiety    Arthritis    Back pain    Blood transfusion without reported diagnosis    Bunion 10/19/2007   Depressed    Diverticulosis 2010   Colonoscopy   Dizziness    vertigo   GERD (gastroesophageal reflux disease)    Hiatal hernia    EGD    HTN (hypertension)    Hx of colonic polyps 2010   Colonoscopy   IBS (irritable bowel syndrome)    Interstitial cystitis    OA (ocular albinism)    Ovarian cancer (HCC)    Personal history of chemotherapy    Stricture and stenosis of esophagus 2009   EGD    UTI (lower urinary tract infection)    hx of    Last seen in cardiology clinic in 2000 Who presents for follow-up of history of chest pain, hypertension   She reports that for several weeks to months she has had periodic chest pain radiating through to her back, posterior shoulder, left arm pain Getting worse,  Reports she is active at baseline Unclear if activity brings on her symptoms, sometimes at rest sometimes with activity Does not seem to have clear relationship with food or eating Does not think it is from chronic arthritic issues Hx of reverse shoulder replacement on the left Pain is different from shoulder pain   CT chest, performed recently for pulmonary issues, January 2020 Images pulled up and discussed with her Moderate aortic atherosclerosis noted Chronic hiatal hernia.   Prior hospitalizations discussed with her  In the hospital 05/2017 Hospital records reviewed with the patient in  detail UTI, sepsis, ARF weak and deconditioned need of rehab.    PMH:   has a past medical history of Abnormality of lung on CXR (01/21/2012), Allergy, Anemia, Anxiety, Arthritis, Back pain, Blood transfusion without reported diagnosis, Bunion (10/19/2007), Depressed, Diverticulosis (2010), Dizziness, GERD (gastroesophageal reflux disease), Hiatal hernia, HTN (hypertension), colonic polyps (2010), IBS (irritable bowel syndrome), Interstitial cystitis, OA (ocular albinism), Ovarian cancer (HCC), Personal history of chemotherapy, Stricture and stenosis of esophagus (2009), and UTI (lower urinary tract infection).   PSH:    Past Surgical History:  Procedure Laterality Date   ABDOMINAL HYSTERECTOMY  1962   ovarian cancer   APPENDECTOMY     breast biopsy- benign     BREAST CYST EXCISION Left    bunion/morton's neuroma  06/2005   right foot    carpal tunnel release-right     CHOLECYSTECTOMY  1994   colitis transfusion     hosp at 25   COLONOSCOPY     very difficul- supp with BE past   EGD tight ues sphincter  09/2007   with dialtion   FOOT SURGERY  12/2007   TMT fusion and bunionectomy   HEMORRHOID SURGERY     INCISIONAL HERNIA REPAIR  1995   after ccy   left knee surgery  left shoulder replacement     stress cardiolite  11/2008   VIDEO BRONCHOSCOPY  04/22/2012   Procedure: VIDEO BRONCHOSCOPY WITH FLUORO;  Surgeon: Vicenta KATHEE Lennert, MD;  Location: [facility];  Service: Cardiopulmonary;  Laterality: Bilateral;    Current Outpatient Medications  Medication Sig Dispense Refill   acetaminophen  (TYLENOL ) 500 MG tablet Take 500 mg by mouth every 6 (six) hours as needed.     ALPRAZolam  (XANAX ) 0.25 MG tablet TAKE 1 TABLET BY MOUTH AT BEDTIME AS NEEDED FOR ANXIETY. 30 tablet 0   amitriptyline  (ELAVIL ) 50 MG tablet TAKE 1 TABLET BY MOUTH EVERYDAY AT BEDTIME 90 tablet 0   beclomethasone (QVAR  REDIHALER) 40 MCG/ACT inhaler Inhale 1 puff into the lungs 2 (two) times daily. (Patient not  taking: Reported on 01/21/2024) 1 each 5   bisoprolol  (ZEBETA ) 5 MG tablet Take 1 tablet (5 mg total) by mouth in the morning and at bedtime. 180 tablet 1   buPROPion  (WELLBUTRIN  SR) 150 MG 12 hr tablet Take 2 tablets (300 mg total) by mouth daily. 180 tablet 1   carvedilol  (COREG ) 6.25 MG tablet Take 1 tablet (6.25 mg total) by mouth 2 (two) times daily. 60 tablet 1   Cholecalciferol  (VITAMIN D3) 2000 units TABS Take by mouth.     gabapentin  (NEURONTIN ) 100 MG capsule Take 2 capsules (200 mg total) by mouth at bedtime. 180 capsule 0   pantoprazole  (PROTONIX ) 40 MG tablet Take 1 tablet (40 mg total) by mouth 2 (two) times daily. 180 tablet 1   Suvorexant  (BELSOMRA ) 10 MG TABS Take 1 tablet (10 mg total) by mouth at bedtime as needed. (Patient not taking: Reported on 01/21/2024) 30 tablet 0   triamterene -hydrochlorothiazide (MAXZIDE-25) 37.5-25 MG tablet Take 1 tablet by mouth daily. 90 tablet 1   vitamin B-12 (CYANOCOBALAMIN ) 1000 MCG tablet Take 1,000 mcg by mouth daily.     No current facility-administered medications for this visit.     Allergies:   Patient has no known allergies.   Social History:  The patient  reports that she has never smoked. She has never used smokeless tobacco. She reports current alcohol use. She reports that she does not use drugs.   Family History:   family history includes Coronary artery disease in her father; Heart disease in her father; Hyperlipidemia in her sister; Hypertension in her father and sister; Leukemia in her maternal grandfather and maternal uncle; Ovarian cancer in her maternal grandmother; Stroke in her mother.    Review of Systems: ROS   PHYSICAL EXAM: VS:  There were no vitals taken for this visit. , BMI There is no height or weight on file to calculate BMI. GEN: Well nourished, well developed, in no acute distress HEENT: normal Neck: no JVD, carotid bruits, or masses Cardiac: RRR; no murmurs, rubs, or gallops,no edema  Respiratory:   clear to auscultation bilaterally, normal work of breathing GI: soft, nontender, nondistended, + BS MS: no deformity or atrophy Skin: warm and dry, no rash Neuro:  Strength and sensation are intact Psych: euthymic mood, full affect    Recent Labs: 11/18/2023: ALT 16; TSH 3.090 03/23/2024: BUN 21; Creatinine, Ser 0.92; Hemoglobin 11.2; Platelets 249; Potassium 3.7; Sodium 142    Lipid Panel Lab Results  Component Value Date   CHOL 172 11/18/2023   HDL 52 11/18/2023   LDLCALC 103 (H) 11/18/2023   TRIG 95 11/18/2023      Wt Readings from Last 3 Encounters:  03/23/24 130 lb (59 kg)  11/18/23 131  lb 9.6 oz (59.7 kg)  09/09/23 131 lb 2 oz (59.5 kg)       ASSESSMENT AND PLAN:  Problem List Items Addressed This Visit   None    Disposition:   F/U  12 months   Total encounter time more than 30 minutes  Greater than 50% was spent in counseling and coordination of care with the patient    Signed, Velinda Lunger, M.D., Ph.D. [facility] [place], [place] [phone]

## 2024-05-03 ENCOUNTER — Ambulatory Visit: Admitting: Cardiovascular Disease

## 2024-05-03 DIAGNOSIS — I10 Essential (primary) hypertension: Secondary | ICD-10-CM

## 2024-05-03 DIAGNOSIS — I700 Atherosclerosis of aorta: Secondary | ICD-10-CM

## 2024-05-03 DIAGNOSIS — R079 Chest pain, unspecified: Secondary | ICD-10-CM

## 2024-05-03 DIAGNOSIS — J479 Bronchiectasis, uncomplicated: Secondary | ICD-10-CM

## 2024-05-17 ENCOUNTER — Ambulatory Visit: Admitting: Cardiovascular Disease
